# Patient Record
Sex: Female | Born: 1970 | Race: White | Hispanic: No | Marital: Single | Smoking: Former smoker
Health system: Southern US, Community
[De-identification: ages and names within clinical notes are randomized; demographics above are authoritative.]

## PROBLEM LIST (undated history)

## (undated) DIAGNOSIS — E039 Hypothyroidism, unspecified: Secondary | ICD-10-CM

## (undated) DIAGNOSIS — E079 Disorder of thyroid, unspecified: Secondary | ICD-10-CM

## (undated) DIAGNOSIS — F32A Depression, unspecified: Secondary | ICD-10-CM

## (undated) DIAGNOSIS — N189 Chronic kidney disease, unspecified: Secondary | ICD-10-CM

## (undated) DIAGNOSIS — R32 Unspecified urinary incontinence: Secondary | ICD-10-CM

## (undated) DIAGNOSIS — B2790 Infectious mononucleosis, unspecified without complication: Secondary | ICD-10-CM

## (undated) DIAGNOSIS — N329 Bladder disorder, unspecified: Secondary | ICD-10-CM

## (undated) DIAGNOSIS — G9332 Myalgic encephalomyelitis/chronic fatigue syndrome: Secondary | ICD-10-CM

## (undated) DIAGNOSIS — R87619 Unspecified abnormal cytological findings in specimens from cervix uteri: Secondary | ICD-10-CM

## (undated) DIAGNOSIS — Z87440 Personal history of urinary (tract) infections: Secondary | ICD-10-CM

## (undated) DIAGNOSIS — R5382 Chronic fatigue, unspecified: Secondary | ICD-10-CM

## (undated) DIAGNOSIS — F329 Major depressive disorder, single episode, unspecified: Secondary | ICD-10-CM

## (undated) DIAGNOSIS — D649 Anemia, unspecified: Secondary | ICD-10-CM

## (undated) DIAGNOSIS — Z8669 Personal history of other diseases of the nervous system and sense organs: Secondary | ICD-10-CM

## (undated) DIAGNOSIS — Z8739 Personal history of other diseases of the musculoskeletal system and connective tissue: Secondary | ICD-10-CM

## (undated) DIAGNOSIS — D8989 Other specified disorders involving the immune mechanism, not elsewhere classified: Secondary | ICD-10-CM

## (undated) DIAGNOSIS — F419 Anxiety disorder, unspecified: Secondary | ICD-10-CM

## (undated) DIAGNOSIS — IMO0002 Reserved for concepts with insufficient information to code with codable children: Secondary | ICD-10-CM

## (undated) HISTORY — DX: Anemia, unspecified: D64.9

## (undated) HISTORY — DX: Depression, unspecified: F32.A

## (undated) HISTORY — PX: ROBOTIC ASSISTED LAP VAGINAL HYSTERECTOMY: SHX2362

## (undated) HISTORY — PX: TOTAL VAGINAL HYSTERECTOMY: SHX2548

## (undated) HISTORY — DX: Other specified disorders involving the immune mechanism, not elsewhere classified: D89.89

## (undated) HISTORY — DX: Unspecified urinary incontinence: R32

## (undated) HISTORY — DX: Hypothyroidism, unspecified: E03.9

## (undated) HISTORY — DX: Bladder disorder, unspecified: N32.9

## (undated) HISTORY — DX: Infectious mononucleosis, unspecified without complication: B27.90

## (undated) HISTORY — DX: Anxiety disorder, unspecified: F41.9

## (undated) HISTORY — DX: Unspecified abnormal cytological findings in specimens from cervix uteri: R87.619

## (undated) HISTORY — DX: Myalgic encephalomyelitis/chronic fatigue syndrome: G93.32

## (undated) HISTORY — DX: Major depressive disorder, single episode, unspecified: F32.9

## (undated) HISTORY — DX: Chronic kidney disease, unspecified: N18.9

## (undated) HISTORY — DX: Reserved for concepts with insufficient information to code with codable children: IMO0002

## (undated) HISTORY — DX: Personal history of other diseases of the musculoskeletal system and connective tissue: Z87.39

## (undated) HISTORY — DX: Chronic fatigue, unspecified: R53.82

## (undated) HISTORY — DX: Personal history of other diseases of the nervous system and sense organs: Z86.69

## (undated) HISTORY — PX: SYNOVECTOMY WRIST: SUR1324

## (undated) HISTORY — DX: Personal history of urinary (tract) infections: Z87.440

## (undated) HISTORY — DX: Disorder of thyroid, unspecified: E07.9

---

## 1998-05-06 ENCOUNTER — Other Ambulatory Visit: Admission: RE | Admit: 1998-05-06 | Discharge: 1998-05-06 | Payer: Self-pay | Admitting: Obstetrics and Gynecology

## 1999-05-03 ENCOUNTER — Encounter (INDEPENDENT_AMBULATORY_CARE_PROVIDER_SITE_OTHER): Payer: Self-pay | Admitting: Specialist

## 1999-05-03 ENCOUNTER — Other Ambulatory Visit: Admission: RE | Admit: 1999-05-03 | Discharge: 1999-05-03 | Payer: Self-pay | Admitting: Orthopedic Surgery

## 1999-05-10 ENCOUNTER — Other Ambulatory Visit: Admission: RE | Admit: 1999-05-10 | Discharge: 1999-05-10 | Payer: Self-pay | Admitting: Obstetrics and Gynecology

## 2000-07-20 ENCOUNTER — Other Ambulatory Visit: Admission: RE | Admit: 2000-07-20 | Discharge: 2000-07-20 | Payer: Self-pay | Admitting: Obstetrics & Gynecology

## 2001-07-23 ENCOUNTER — Other Ambulatory Visit: Admission: RE | Admit: 2001-07-23 | Discharge: 2001-07-23 | Payer: Self-pay | Admitting: Obstetrics and Gynecology

## 2002-07-29 ENCOUNTER — Other Ambulatory Visit: Admission: RE | Admit: 2002-07-29 | Discharge: 2002-07-29 | Payer: Self-pay | Admitting: Obstetrics and Gynecology

## 2003-02-09 ENCOUNTER — Ambulatory Visit (HOSPITAL_COMMUNITY): Admission: RE | Admit: 2003-02-09 | Discharge: 2003-02-09 | Payer: Self-pay | Admitting: Obstetrics and Gynecology

## 2003-02-09 ENCOUNTER — Encounter (INDEPENDENT_AMBULATORY_CARE_PROVIDER_SITE_OTHER): Payer: Self-pay

## 2003-02-11 ENCOUNTER — Inpatient Hospital Stay (HOSPITAL_COMMUNITY): Admission: AD | Admit: 2003-02-11 | Discharge: 2003-02-11 | Payer: Self-pay | Admitting: Obstetrics and Gynecology

## 2003-08-06 ENCOUNTER — Other Ambulatory Visit: Admission: RE | Admit: 2003-08-06 | Discharge: 2003-08-06 | Payer: Self-pay | Admitting: Obstetrics and Gynecology

## 2006-04-12 ENCOUNTER — Ambulatory Visit: Payer: Self-pay | Admitting: Family Medicine

## 2006-09-20 ENCOUNTER — Other Ambulatory Visit: Admission: RE | Admit: 2006-09-20 | Discharge: 2006-09-20 | Payer: Self-pay | Admitting: Obstetrics and Gynecology

## 2006-10-03 ENCOUNTER — Ambulatory Visit: Payer: Self-pay | Admitting: Family Medicine

## 2006-10-10 ENCOUNTER — Ambulatory Visit: Payer: Self-pay | Admitting: Family Medicine

## 2006-10-19 ENCOUNTER — Ambulatory Visit: Payer: Self-pay | Admitting: Family Medicine

## 2006-10-23 ENCOUNTER — Ambulatory Visit: Payer: Self-pay | Admitting: Internal Medicine

## 2006-11-15 ENCOUNTER — Ambulatory Visit: Payer: Self-pay | Admitting: Family Medicine

## 2007-02-12 ENCOUNTER — Ambulatory Visit: Payer: Self-pay | Admitting: Family Medicine

## 2007-03-06 ENCOUNTER — Ambulatory Visit: Payer: Self-pay | Admitting: Family Medicine

## 2007-03-28 ENCOUNTER — Telehealth (INDEPENDENT_AMBULATORY_CARE_PROVIDER_SITE_OTHER): Payer: Self-pay | Admitting: Internal Medicine

## 2007-04-22 ENCOUNTER — Ambulatory Visit: Payer: Self-pay | Admitting: Pain Medicine

## 2007-04-22 ENCOUNTER — Encounter (INDEPENDENT_AMBULATORY_CARE_PROVIDER_SITE_OTHER): Payer: Self-pay | Admitting: Internal Medicine

## 2007-08-23 ENCOUNTER — Ambulatory Visit (HOSPITAL_COMMUNITY): Admission: RE | Admit: 2007-08-23 | Discharge: 2007-08-24 | Payer: Self-pay | Admitting: Obstetrics and Gynecology

## 2007-08-23 ENCOUNTER — Encounter (INDEPENDENT_AMBULATORY_CARE_PROVIDER_SITE_OTHER): Payer: Self-pay | Admitting: Obstetrics and Gynecology

## 2008-10-16 ENCOUNTER — Emergency Department (HOSPITAL_COMMUNITY): Admission: EM | Admit: 2008-10-16 | Discharge: 2008-10-16 | Payer: Self-pay | Admitting: *Deleted

## 2010-12-15 NOTE — Progress Notes (Signed)
Summary: LETTER   Phone Note Call from Patient Call back at Home Phone [phone]   Caller: Patient Call For: BEAN Summary of Call: PT NEEDS A LETTER STATING THAT YOU DID REFER HER TO PAMELA PITTMAN FOR PANIC/ANXUETY ATTACKS Initial call taken by: Liane Comber,  Mar 28, 2007 4:25 PM  Follow-up for Phone Call        need her chart  --------------------------- Letter done 04/01/07 Follow-up by: Gildardo Griffes FNP,  Mar 29, 2007 7:18 AM  Additional Follow-up for Phone Call Additional follow up Details #1::        chart is now in triage in box ..................................................................Marland KitchenLiane Comber  Mar 29, 2007 9:50 AM\par

## 2010-12-15 NOTE — Consult Note (Signed)
Summary: Consultation Report  Consultation Report   Imported By: Beau Fanny 05/03/2007 10:24:48  _____________________________________________________________________  External Attachment:    Type:   Image     Comment:   External Document

## 2011-03-28 NOTE — Op Note (Signed)
Ariel King, Ariel King              ACCOUNT NO.:  [id number]   MEDICAL RECORD NO.:  [id number]          PATIENT TYPE:  AMB   LOCATION:  DAY                          FACILITY:  [facility]   PHYSICIAN:  Crist Fat. Rivard, M.D. DATE OF BIRTH:  1971-10-26   DATE OF PROCEDURE:  08/23/2007  DATE OF DISCHARGE:                               OPERATIVE REPORT   PREOPERATIVE DIAGNOSIS:  Chronic pelvic pain with adenomyosis.   POSTOPERATIVE DIAGNOSIS:  Chronic pelvic pain with adenomyosis.   ANESTHESIA:  General.   PROCEDURE:  Robotic-assisted total hysterectomy.   SURGEON:  Crist Fat. Rivard, M.D.   ASSISTANT:  Henreitta Leber, P.A.   PROCEDURE:  After being informed of the planned procedure with possible  complications including bleeding, infection, injury to bowel, bladder or  ureters, need for laparotomy, informed consent is obtained.  The patient  is taken to OR #10, given general anesthesia with endotracheal  intubation and placed in a lithotomy position on a sticky mattress with  knee-high sequential compression devices.  Her arms are padded and  tucked on each side and her chest is taped to the table.  She is prepped  and draped in a sterile fashion and a Foley catheter is inserted in her  bladder.  Pelvic exam reveals an anteverted uterus which is slightly  bulky, but otherwise normal and 2 normal adnexa.  There was no  significant prolapse on the exam.  A weighted speculum is inserted.  Anterior lip of the cervix is grasped with a tenaculum forceps and the  Mirena IUD is removed.  The uterus is then sounded at 8 cm and the  cervix easily allows the entry of a #29 Hegar dilator.  Using a 8-mm  Rumi intrauterine manipulator, we insert it easily with a 3.5 co-ring  and a vaginal occluder.  The Rumi is inflated with 5 mL of saline and  the co-ring is sutured to the cervix with 3 simple sutures of 0 Vicryl.  The tenaculum forceps is changed and replaced for a Jacobs forceps.  Weighted speculum  is removed.   We infiltrate the umbilical area with Marcaine 0.25, perform a semi-  elliptical incision and we bluntly reach the fascia.  The fascia is  identified, grasped with Kocher forceps, incised and the peritoneum is  entered bluntly.  A pursestring suture of 0 Vicryl is placed on the  fascia and a 10-mm Hasson trocar is inserted and held in place by the  pursestring suture.  This allows insufflation of the pneumoperitoneum  with CO2 at a maximum pressure of 15 mmHg.  Camera is inserted to  evaluate the pelvis.  We see a bulky, soft uterus, mobile, normal  anterior and posterior cul-de-sac, normal bilateral tubes, normal right  ovary; the left ovary has a dominant follicle of approximately 1.5 cm.  Liver edge is visualized and normal.  No other significant finding is  noted.  The camera is removed.  We measured the placement of our trocars  and placed two 8-mm robotic trocars on the left side, one 8-mm robotic  trocar on the right side and one 10-mm  patient-side assistant trocar on  the right side, all under direct visualization after infiltrating with  Marcaine 0.25%.   The robot is then docked and console time starts at 8:43, which gives [place]  a total docking time including preparation of 40 minutes.   Using a monopolar scissors in arm #1, a PK Gyrus forceps in arm #2 and a  Cobra grasper in arm #3, we proceed systematically to perform a total  robotic hysterectomy.  We start with the right side and cauterized using  the PK forceps, cauterize the utero-ovarian ligament and section it,  cauterize the tube and section it, cauterize the round ligament and  section it.  The broad ligament can then be sharply dissected anteriorly  and posteriorly and the bladder can be sharply and bluntly dissected  away from the vaginal cuff.  This vaginal cuff is easily identified with  pressure applied on the co-ring.  The right ureter is away from our site  of cauterization and section and we can  now isolate the uterine vessels  in the ascending branch near the co-ring on the right side; these  vessels were then cauterized with PK forceps and sectioned.  We proceed  in the same fashion on the left side, cauterizing the utero-ovarian  ligament and sectioning it, cauterizing the tube and sectioning it,  cauterizing the round ligament and sectioning it.  The broad ligament  again is opened both in the anterior sheath and posterior sheath to  skeletonize the vessels and finished the dissection of the bladder away  from the vaginal fornix.  The vessels are then well skeletonized and  away from the site of the ureter, they are then grasped with PK forceps,  cauterized and sectioned.   The vaginal occluder is then inflated and we proceed with a circular  colpotomy, guiding ourselves with the co-ring in order to remove the  uterus entirely; this is performed using monopolar scissors, and  occasionally cauterization with PK forceps.  The uterus is released  entirely and delivered via the vagina.  We did encounter a small amount  of time where the pneumoperitoneum was minimum and so the uterus is left  inside the vagina to optimize the pneumoperitoneum and facilitate the  closure of the vaginal cuff.   Instruments are then modified for arm #1 needle holder, arm #2 needle  holder and arm #3 PK forceps.  Using 0 Vicryl, we proceed with closure  of the vaginal cuff using figure-of-eight stitches of 0 Vicryl.  Please  note that the cuff was opened superiorly to the uterosacral ligament,  preserving natural suspension.   We then irrigate profusely with warm saline and note a few oozing sites  on our dissection of the bladder flap, which is controlled with  cauterization.  Both ureters are visualized with good peristalsis and  hemostasis is deemed adequate.  A sheet of Intercede is deposited on the  vaginal cuff to prevent formation of future adhesions.   All instruments are removed, robot  is undocked, and trocars are removed  under direct visualization after evacuating the pneumoperitoneum.   The umbilical incision's fascia is closed with the previously placed  pursestring suture of 0 Vicryl.  The fascia at the site of the 10-mm  patient-side assistant trocar is closed with a figure-of-eight stitch of  0 Vicryl.  All incisions are closed with subcuticular suture of 3-0  Monocryl and Steri-Strips.   Instrument and sponge count is complete x2.  Estimated blood loss is  minimal.  The procedure is very well tolerated by the patient, who is  taken to recovery room in a well and stable condition.   SPECIMEN:  Uterus sent to Pathology.      Crist Fat Rivard, M.D.  Electronically Signed     SAR/MEDQ  D:  08/23/2007  T:  08/24/2007  Job:  [id number]

## 2011-03-28 NOTE — H&P (Signed)
Ariel King, Ariel King              ACCOUNT NO.:  [id number]   MEDICAL RECORD NO.:  [id number]          PATIENT TYPE:  AMB   LOCATION:  DAY                          [facility]ACILITY:  [facility]   PHYSICIAN:  Crist Fat. Rivard, M.D. DATE OF BIRTH:  31-Jul-1971   DATE OF ADMISSION:  DATE OF DISCHARGE:                              HISTORY & PHYSICAL   EXPECTED DATE OF ADMISSION:  August 23, 2007   HISTORY OF PRESENT ILLNESS:  Ariel King is a 40 year old married white  female para 2-0-0-2 who presents for  robotic assisted laparoscopic  hysterectomy because of dysfunctional uterine bleeding, adenomyosis and  pelvic pain.  For over 4 years, the patient has had irregular heavy  menstrual bleeding ranging from several days of spotting before her  period to 16 days of a flow which includes clots and requires her to  change a super tampon every 1-2 hours.  All this bleeding is typically  accompanied by severe pelvic and back cramping requiring narcotic  analgesia.  In 2004 the patient underwent a diagnostic laparoscopy to  evaluate for endometriosis.  Endometriosis was not found.  However, the  patient was diagnosed with adenomyosis.  She was placed on Danazol and  then birth control pills following her surgery which improved her  bleeding but not her pain.  She underwent physical therapy and  eventually or and orthopedic consultation for which she was given facet  injections to curtail her pain.  The patient received symptomatic relief  of both bleeding and pain.  However, that relief was short-lived.  She  then received a Mirena intrauterine system in May 2005 which was  preceded by a course of Aygestin  for her heavy bleeding.  Fortunately  the patient achieved amenorrhea with Mirena and a marked decrease in her  pelvic/back pain.  This relief lasted approximately 2 years and then her  pelvic/pain returned, occurred daily, and was accompanied on occasion  with light bleeding which lasted 10 days.  Once  again the patient was  requiring narcotic analgesia.  She denies any urinary tract symptoms,  nausea, vomiting, diarrhea, fever, vaginitis symptoms, however she does  admit to positional dyspareunia, constipation and a burning pain in her  lower back.  A thorough review of both medical and surgical options were  given to the patient for consideration, however given the protracted  course of her symptoms and multiple failed therapies, she has consented  to proceed with definitive therapy in the form of hysterectomy.   PAST MEDICAL HISTORY:  OB history gravida 2, para 2-0-0-2.  The patient  had two spontaneous vaginal births.  GYN history:  Menarche 40 years  old.  Her last menstrual period:  The patient has not had one due to the  fact she has an intrauterine device (Mirena).  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  Her last Pap smear was ASCUS in May 2008, however her HPV test was  negative.   MEDICAL HISTORY:  Peptic ulcer disease, pelvic pain with lower back  pain, anemia, anxiety disorder and  depression.   SURGICAL HISTORY:  1986, cervical conization, 2000 left wrist surgery,  2004 diagnostic laparoscopy.  The patient reports anesthesia causes her  severe vomiting.  She denies any history of blood transfusions.   FAMILY HISTORY:  Cardiovascular disease, prostate and lung cancer,  thyroid disease, diabetes mellitus, hypertension, thyroid disease   SOCIAL HISTORY:  The patient is married and she works as a Chartered loss adjuster.   HABITS:  She is a former smoker, does not use alcohol   MEDICATIONS:  Darvocet-N 50 1 tablet every 4-6 hours as needed for pain,  Lexapro 30 mg daily, Xanax 0.5 mg four times a day with the addition of  1/2 tablet four times a day as needed.   ALLERGIES:  She has no known drug allergies.   REVIEW OF SYSTEMS:  The patient does have occasional blood in her stool  (negative GI workup), back pain,  but denies any chest pain, shortness of  breath, headache, vision changes, unilateral weakness and except as  mentioned in history of present illness the patient's review of systems  is negative.   PHYSICAL EXAM:  Vital Signs: Blood pressure 118/70 pulses 80, weight is  187.  Height is 5 feet 4 inches tall.  NECK is supple without masses.  There is no cervical adenopathy or thyromegaly.  HEART: Regular rate and rhythm.  There is no murmur.  LUNGS were clear.  BACK: No CVA tenderness, though the patient does have tenderness in both  SI joints and over her sacrum.  ABDOMEN: Without tenderness, masses or organomegaly.  EXTREMITIES: No clubbing, cyanosis or edema.  PELVIC EXAM:  EG/BUS is within normal limits.  Vagina is rugous.  Cervix  is nontender without lesions.  The patient's IUD strings are visible.  Uterus appears normal size, shape and consistency, however it is tender.  Adnexa without masses or tenderness.   IMPRESSION:  1. Dysfunctional uterine bleeding.  2. Adenomyosis.  3. Pelvic pain.   DISPOSITION:  A discussion was held with the patient regarding the  indications for her procedure along with its risks which include but are  not limited to reaction to anesthesia, damage to adjacent organs,  infection, excessive bleeding and the possible need for an open  abdominal incision to complete her surgery safely.  The patient further  understands that she will experience transient facial edema  postoperatively, that her hospital stay is expected to be 1-2 days and  that within 2-3 weeks she should be able to return to her usual  activities.  The patient verbalized understanding of these risks and has  consented to proceed with robotic-assisted laparoscopic hysterectomy at  [facility] on August 23, 2007, 7:30 a.m.  The patient  was given a MiraLax bowel prep to be completed 24 hours prior to her  procedure.      Elmira J. Adline Peals.      Crist Fat  Rivard, M.D.  Electronically Signed    EJP/MEDQ  D:  08/20/2007  T:  08/20/2007  Job:  [id number]

## 2011-03-31 NOTE — Assessment & Plan Note (Signed)
[facility]                                 ON-CALL NOTE   NAME:Hansmann, ULONDA                       MRN:          [id number]  DATE:01/06/2007                            DOB:          05/06/1971    PHONE:  [phone].   PRIMARY CARE:  Everrett Coombe at [facility].   TIME:  4:54 p.m. on February 24.   She has back pain that started on Friday night when she was moving some  heavy furniture.  She has tried ibuprofen but it has not helped a whole  lot and she is having trouble sleeping.  She asked if I can phone in  some pain medicine for her to help her sleep.   PLAN:  I told her that we do not phone in pain medicines on weekends or  at nights and that if she really thought she needs some help she should  probably seek care tonight in an Urgent Care or emergency room.  Otherwise, we can see her tomorrow in the office.     Karie Schwalbe, MD  Electronically Signed    RIL/MedQ  DD: 01/06/2007  DT: 01/06/2007  Job #: [id number]   cc:   Willaim Sheng D. Bean, FNP

## 2011-03-31 NOTE — Op Note (Signed)
NAME:  Ariel King, Ariel King                        ACCOUNT NO.:  [id number]   MEDICAL RECORD NO.:  [id number]                   PATIENT TYPE:  AMB   LOCATION:  [facility]C                                  FACILITY:  [facility]   PHYSICIAN:  Crist Fat. Rivard, M.D.              DATE OF BIRTH:  04-07-1971   DATE OF PROCEDURE:  02/09/2003  DATE OF DISCHARGE:                                 OPERATIVE REPORT   PREOPERATIVE DIAGNOSES:  Pelvic pain with dyspareunia.   POSTOPERATIVE DIAGNOSES:  Pelvic pain with dyspareunia with possible  adenomyosis.   ANESTHESIA:  General, Burnett Corrente, M.D.   PROCEDURE:  Diagnostic laparoscopy with peritoneal biopsy.   SURGEON:  Crist Fat. Rivard, M.D.   ESTIMATED BLOOD LOSS:  Minimal.   PROCEDURE:  After being informed of the planned procedure with possible  complications including bleeding, infection, injury to other organs  necessitating laparotomy, informed consent was obtained.  The patient was  taken to OR number eight, given general anesthesia with endotracheal  intubation, and placed in lithotomy position.  She was prepped and draped in  a sterile fashion.  A Foley catheter was inserted in her bladder and an  acorn manipulator was placed in her uterus with a tenaculum on her anterior  lip of her cervix.   We proceeded with infiltration of the umbilical area using 10 mL of Marcaine  0.25 and performed a semi-elliptical incision for insertion of Veress needle  and insufflation of pneumoperitoneum with CO2 at a maximum pressure of 15  mmHg.  Veress needle was removed and a 10 mm trocar was inserted with the  laparoscope on video.  Suprapubic area was infiltrated with 4 mL of Marcaine  0.25.  We proceeded with insertion under direct visualization of a 5 mm  trocar.   Observation:  Anterior cul-de-sac is normal other than diffuse petechiae in  the anterior cul-de-sac close to the right round ligament.  Uterus is  slightly boggy and consistency is compatible  with adenomyosis.  Posterior  cul-de-sac is normal.  Both tubes and both ovaries are normal.  There is no  apparent pelvic inflammatory disease and there is no adhesions.  Appendix  was well visualized and normal.  Liver edge was visualized and normal.  We  proceeded with a peritoneal biopsy of the right anterior cul-de-sac using  the because punch.  This was sent for pathology.  There was no active  bleeding.  Pneumoperitoneum was removed.  Instruments were removed.  Instrument and sponge count was complete x2.  Both incisions were closed  with subcuticular suture of 4-0 Vicryl.   Procedure was very well tolerated by patient who is taken to recovery room  in a well and stable condition.  Crist Fat Rivard, M.D.    SAR/MEDQ  D:  02/09/2003  T:  02/09/2003  Job:  [id number]

## 2011-03-31 NOTE — Consult Note (Signed)
NAME:  Ariel King, Ariel King                        ACCOUNT NO.:  [id number]   MEDICAL RECORD NO.:  [id number]                   PATIENT TYPE:  MAT   LOCATION:  [facility]C                                 FACILITY:  [facility]   PHYSICIAN:  Janine Limbo, M.D.            DATE OF BIRTH:  12-28-1970   DATE OF CONSULTATION:  02/11/2003  DATE OF DISCHARGE:                                   CONSULTATION   HISTORY OF PRESENT ILLNESS:  Ms. Droessler is a 40 year old female, P2-0-0-2,  who had a diagnostic laparoscopy performed on 02/09/03 by Dr. Dois Davenport Rivard.  The diagnosis for the procedure was pelvic pain with dyspareunia.  The  operative findings suggested adenomyosis.  A biopsy was obtained to rule out  endometriosis.  The patient's procedure went well.  There were no  complications.  The patient was thought to have tolerated her procedure  well.  The patient then reported that today she started noticing shortness  of breath and pain in her upper back and in her shoulders.  She complained  of swelling from her knees down to her feet.  She felt that she had fluid  that she could not cough up.  She reported that she started her cycle.  The  patient's last bowel movement was on 02/08/03.  She denies any problems  passing her urine.  She tolerated her diet well on 02/10/03.  She reports  that she has not eaten today.   DRUG ALLERGIES:  None known.   PAST MEDICAL HISTORY:  The patient currently takes oral contraceptives.  She  had an abnormal Pap smear in the past.  She denies hypertension and  diabetes.   PHYSICAL EXAMINATION:  VITAL SIGNS:  Temperature is 97.5, pulse 75,  respirations 18, blood pressure 143/80.  HEENT:  Within normal limits.  The patient does not appear to be in any  acute distress.  CHEST:  Clear.  HEART:  Regular rate and rhythm.  ABDOMEN:  Soft and appropriately tender.  Her incisions are healing well.  EXTREMITIES:  Obese, and there is 1+ to 2+ edema in her lower extremities.  There is trace edema in her upper extremities.  There  are no cords or  masses in her lower legs.  There is no tenderness in her lower legs.  NEUROLOGIC:  Grossly normal.  PELVIC:  External genitalia is normal.  The vagina is normal.  Cervix is  nontender.  Uterus is normal size, shape, and consistency (as best could be  told by an exam that is limited by obesity).  Adnexa with no masses.   LABORATORY VALUES:  White blood cell count of 7200, hemoglobin 12.3,  hematocrit 36.2%, platelet count 235,000.  Urinalysis is within normal  limits except for a large hemoglobin.   ASSESSMENT:  1. Two days status post diagnostic laparoscopy.  2. Shortness of breath of uncertain etiology.  The patient reports that she  now actually feels better.  Her O2 sat in the emergency department was     99% on room air.  3. Swelling in the lower extremities of uncertain etiology.    PLAN:  The patient will follow up with her family physician about her  swelling.  She will call if her shortness of breath should return or if she  should have any other major difficulties.  She will be discharged to home.  She will follow up with Dr. Estanislado Pandy as previously scheduled.                                               Janine Limbo, M.D.    AVS/MEDQ  D:  02/11/2003  T:  02/11/2003  Job:  [id number]   cc:   Dois Davenport A. Rivard, M.D.  [address]  [place]  [place] [address]  Fax: [phone]

## 2011-03-31 NOTE — Letter (Signed)
Apr 01, 2007     RE:  BRAELIN, BROSCH  MRN:  [id number]  /  DOB:  1971-05-11   To Whom It May Concern:   Ariel King is a patient in the [facility] and was referred to Dr. Jamas Lav, psychiatrist, and was first  seen February 25, 2007.   If I can be of further assistance, please do not hesitate to call.    Sincerely,       Billie D. Jillyn Hidden, FNP       Arta Silence, MD    BDB/MedQ  DD: 04/01/2007  DT: 04/01/2007  Job #: [id number]

## 2011-08-24 LAB — CBC
HCT: 29.7 — ABNORMAL LOW
HCT: 37.1
Hemoglobin: 10.5 — ABNORMAL LOW
Hemoglobin: 12.9
MCHC: 34.8
MCHC: 35.4
MCV: 87.3
MCV: 88
Platelets: 277
Platelets: 305
RBC: 3.4 — ABNORMAL LOW
RBC: 4.21
RDW: 13.5
RDW: 13.6
WBC: 13.6 — ABNORMAL HIGH
WBC: 9.1

## 2011-08-24 LAB — BASIC METABOLIC PANEL
BUN: 9
CO2: 29
Calcium: 9.8
Chloride: 97
Creatinine, Ser: 0.6
GFR calc Af Amer: >60
GFR calc non Af Amer: >60
Glucose, Bld: 95
Potassium: 4.1
Sodium: 136

## 2011-08-24 LAB — PREGNANCY, URINE: Preg Test, Ur: NEGATIVE

## 2011-08-30 ENCOUNTER — Ambulatory Visit (INDEPENDENT_AMBULATORY_CARE_PROVIDER_SITE_OTHER): Payer: BC Managed Care – PPO | Admitting: Psychology

## 2011-08-30 DIAGNOSIS — F410 Panic disorder [episodic paroxysmal anxiety] without agoraphobia: Secondary | ICD-10-CM

## 2011-08-30 DIAGNOSIS — F331 Major depressive disorder, recurrent, moderate: Secondary | ICD-10-CM

## 2011-09-13 ENCOUNTER — Ambulatory Visit: Payer: BC Managed Care – PPO | Admitting: Psychology

## 2012-03-13 ENCOUNTER — Ambulatory Visit: Payer: Self-pay | Admitting: Obstetrics and Gynecology

## 2012-03-14 ENCOUNTER — Ambulatory Visit (INDEPENDENT_AMBULATORY_CARE_PROVIDER_SITE_OTHER): Payer: BC Managed Care – PPO | Admitting: Obstetrics and Gynecology

## 2012-03-14 ENCOUNTER — Encounter: Payer: Self-pay | Admitting: Obstetrics and Gynecology

## 2012-03-14 VITALS — BP 118/72 | Temp 98.4°F | Ht 63.5 in | Wt 195.0 lb

## 2012-03-14 DIAGNOSIS — E079 Disorder of thyroid, unspecified: Secondary | ICD-10-CM

## 2012-03-14 DIAGNOSIS — F411 Generalized anxiety disorder: Secondary | ICD-10-CM

## 2012-03-14 DIAGNOSIS — F419 Anxiety disorder, unspecified: Secondary | ICD-10-CM

## 2012-03-14 DIAGNOSIS — Z113 Encounter for screening for infections with a predominantly sexual mode of transmission: Secondary | ICD-10-CM

## 2012-03-14 DIAGNOSIS — F332 Major depressive disorder, recurrent severe without psychotic features: Secondary | ICD-10-CM | POA: Insufficient documentation

## 2012-03-14 DIAGNOSIS — N399 Disorder of urinary system, unspecified: Secondary | ICD-10-CM

## 2012-03-14 DIAGNOSIS — R6882 Decreased libido: Secondary | ICD-10-CM

## 2012-03-14 DIAGNOSIS — IMO0002 Reserved for concepts with insufficient information to code with codable children: Secondary | ICD-10-CM

## 2012-03-14 DIAGNOSIS — G43909 Migraine, unspecified, not intractable, without status migrainosus: Secondary | ICD-10-CM

## 2012-03-14 DIAGNOSIS — F32A Depression, unspecified: Secondary | ICD-10-CM

## 2012-03-14 DIAGNOSIS — N949 Unspecified condition associated with female genital organs and menstrual cycle: Secondary | ICD-10-CM

## 2012-03-14 DIAGNOSIS — R102 Pelvic and perineal pain: Secondary | ICD-10-CM

## 2012-03-14 DIAGNOSIS — R5382 Chronic fatigue, unspecified: Secondary | ICD-10-CM

## 2012-03-14 DIAGNOSIS — G43009 Migraine without aura, not intractable, without status migrainosus: Secondary | ICD-10-CM | POA: Insufficient documentation

## 2012-03-14 DIAGNOSIS — Z124 Encounter for screening for malignant neoplasm of cervix: Secondary | ICD-10-CM

## 2012-03-14 DIAGNOSIS — F329 Major depressive disorder, single episode, unspecified: Secondary | ICD-10-CM

## 2012-03-14 DIAGNOSIS — Z01419 Encounter for gynecological examination (general) (routine) without abnormal findings: Secondary | ICD-10-CM

## 2012-03-14 LAB — POCT URINALYSIS DIPSTICK
Bilirubin, UA: NEGATIVE
Glucose, UA: NEGATIVE
Ketones, UA: NEGATIVE
Leukocytes, UA: NEGATIVE
Nitrite, UA: NEGATIVE
Spec Grav, UA: >=1.03
Urobilinogen, UA: NEGATIVE
pH, UA: 5

## 2012-03-14 NOTE — Progress Notes (Signed)
Regular Periods: no Mammogram: no  Monthly Breast Ex.: yes Exercise: no  Tetanus < 10 years: yes Seatbelts: yes  NI. Bladder Functn.: no Abuse at home: no  Daily BM's: yes Stressful Work: no  Healthy Diet: yes Sigmoid-Colonoscopy: NO  Calcium: yes Medical problems this year: DECREASED LIBIDO   LAST PAP:2 YEARS AGO WANT PAP Contraception: HYST  Mammogram: NEVER  PCP: Elie Goody  PMH:NO CHANGE FMH: NO CHANGE NO BONE SCAN

## 2012-03-14 NOTE — Progress Notes (Signed)
  Subjective:    Ariel King is a 41 y.o. female, G2P2002, who presents for an annual exam. She is s/p hysterectomy (ovaries spared) requesting PAP smear and STD testing due to marital infidelity . Past several months has not had a sexual  desire and can't be put in the mood. Additionally c/o burning and  hesitancy with urination.    Menstrual cycle:   LMP: No LMP recorded. Patient has had a hysterectomy.           Cycle: NA-hysterectomy  The following portions of the patient's history were reviewed and updated as appropriate: allergies, current medications, past family history, past medical history, past social history, past surgical history and problem list.  Review of Systems Pertinent items are noted in HPI. Breast:Negative for breast lump,nipple discharge or nipple retraction Gastrointestinal: Negative for abdominal pain, change in bowel habits or rectal bleeding Urinary:negative   Objective:    BP 118/72  Temp(Src) 98.4 F (36.9 C) (Oral)  Ht 5' 3.5" (1.613 m)  Wt 195 lb (88.451 kg)  BMI 34.00 kg/m2    Weight:  Wt Readings from Last 1 Encounters:  03/14/12 195 lb (88.451 kg)          BMI: Body mass index is 34.00 kg/(m^2).  General Appearance: Alert, tearful during visit as she speaks of her marital conflicts  HEENT: Grossly normal Neck / Thyroid: Supple, no masses, nodes or enlargement Lungs: clear to auscultation bilaterally Back: No CVA tenderness Breast Exam: No masses or nodes.No dimpling, nipple retraction or discharge. Cardiovascular: Regular rate and rhythm. S1, S2, no murmur Gastrointestinal: Soft, non-tender, no masses or organomegaly Pelvic Exam: External genitalia: normal general appearance Rectal: good sphincter tone and no masses uterus and cervix surgically absent Lymphatic Exam: Non-palpable nodes in neck, clavicular, axillary, or inguinal regions Skin: no rash or abnormalities Neurologic: Normal gait and speech, no tremor  Psychiatric: Alert and  oriented, flat affect, tearful (denies suicidal or homicidal ideations)  Urinalysis:negative   sent for culture   Assessment:    Routine GYN Exam   Decreased Libido  Marital Infidelity Plan:  STD testing-pending  Reviewed causes of deceased libido in women  Recommended counseling for marital infidelity  PAP smear-patient request (reviewed revised PAP guidelines for hysterectomy)   Mammogram advised  F/U with PCP to explore possible interstitial cystitis  Macklen Wilhoite, PA-C

## 2012-03-15 LAB — HSV 1 ANTIBODY, IGG: HSV 1 Glycoprotein G Ab, IgG: 7.32 IV — ABNORMAL HIGH

## 2012-03-15 LAB — HSV 2 ANTIBODY, IGG: HSV 2 Glycoprotein G Ab, IgG: 11.95 IV — ABNORMAL HIGH

## 2012-03-15 LAB — HEPATITIS B SURFACE ANTIGEN: Hepatitis B Surface Ag: NEGATIVE

## 2012-03-15 LAB — GC/CHLAMYDIA PROBE AMP, URINE
Chlamydia, Swab/Urine, PCR: NEGATIVE
GC Probe Amp, Urine: NEGATIVE

## 2012-03-15 LAB — RPR

## 2012-03-15 LAB — HIV ANTIBODY (ROUTINE TESTING W REFLEX): HIV: NONREACTIVE

## 2012-03-15 LAB — HEPATITIS C ANTIBODY: HCV Ab: NEGATIVE

## 2012-03-16 LAB — URINE CULTURE

## 2012-03-19 LAB — PAP IG W/ RFLX HPV ASCU

## 2012-03-29 ENCOUNTER — Telehealth: Payer: Self-pay | Admitting: Obstetrics and Gynecology

## 2012-03-29 NOTE — Telephone Encounter (Signed)
Laura/epic °

## 2012-03-29 NOTE — Telephone Encounter (Signed)
Pt called for test results from 03-14-12. 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  Information on HSV given to patient and told to call asap if suspects breakout. Pt agreeable.  ld

## 2012-04-01 NOTE — Telephone Encounter (Signed)
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  Spoke with pt at length and will leave pamphlet at front desk for her to p/u.  ld

## 2012-05-10 ENCOUNTER — Ambulatory Visit: Payer: Self-pay | Admitting: Obstetrics and Gynecology

## 2012-09-11 ENCOUNTER — Telehealth: Payer: Self-pay | Admitting: Family Medicine

## 2012-09-11 NOTE — Telephone Encounter (Signed)
Caller: Kollyns/Patient; Patient Name: Ariel King; PCP: Eustaquio Boyden [facility]); Best Callback Phone Number: [phone].  Pt calling today 09/11/12 regarding having fatigue.  Said she was a pt at [facility] in the past, however appt desk said she is not current pt of practice and set her up as new pt with Dr. Sharen Hones for December.  Pt said she saw someone named Billy Bean and has also seen Dr. Dayton Martes in the past.  Pt wanting triage for increased fatigue.  Triager advised pt that we are not allowed to offer triage services for new pt of practice.  Triager contacted office and spoke with Rose and she talked with office manager and they advised to tell pt that they will be calling her back at above call back number shortly.  Information given to pt.  No triage assessment done.

## 2012-09-19 ENCOUNTER — Ambulatory Visit: Payer: Self-pay | Admitting: Nurse Practitioner

## 2012-10-31 ENCOUNTER — Ambulatory Visit: Payer: BC Managed Care – PPO | Admitting: Family Medicine

## 2012-12-25 DIAGNOSIS — G4709 Other insomnia: Secondary | ICD-10-CM | POA: Insufficient documentation

## 2012-12-25 DIAGNOSIS — E781 Pure hyperglyceridemia: Secondary | ICD-10-CM | POA: Insufficient documentation

## 2012-12-25 DIAGNOSIS — G894 Chronic pain syndrome: Secondary | ICD-10-CM | POA: Insufficient documentation

## 2012-12-25 DIAGNOSIS — R5382 Chronic fatigue, unspecified: Secondary | ICD-10-CM | POA: Insufficient documentation

## 2012-12-25 DIAGNOSIS — G43009 Migraine without aura, not intractable, without status migrainosus: Secondary | ICD-10-CM | POA: Insufficient documentation

## 2012-12-25 DIAGNOSIS — R739 Hyperglycemia, unspecified: Secondary | ICD-10-CM | POA: Insufficient documentation

## 2013-03-10 ENCOUNTER — Ambulatory Visit (INDEPENDENT_AMBULATORY_CARE_PROVIDER_SITE_OTHER): Payer: BC Managed Care – PPO | Admitting: Family Medicine

## 2013-03-10 ENCOUNTER — Encounter: Payer: Self-pay | Admitting: Family Medicine

## 2013-03-10 VITALS — BP 104/72 | HR 73 | Temp 98.3°F | Ht 63.5 in | Wt 186.2 lb

## 2013-03-10 DIAGNOSIS — H6121 Impacted cerumen, right ear: Secondary | ICD-10-CM | POA: Insufficient documentation

## 2013-03-10 DIAGNOSIS — H6120 Impacted cerumen, unspecified ear: Secondary | ICD-10-CM

## 2013-03-10 NOTE — Progress Notes (Signed)
Subjective:    Patient ID: Ariel King, female    DOB: 02-23-1971, 42 y.o.   MRN: [id number]  HPI Here with "blocked up ears" Cannot hear as well as usual R ear is better  Ears drain a bit   Has had some allergy symptoms - dry eyes and nose  No ear popping She does sometimes use a vinigar solution to clear ears   Patient Active Problem List   Diagnosis Date Noted  . Thyroid disease 03/14/2012  . Anxiety 03/14/2012  . Migraines 03/14/2012  . Depression 03/14/2012  . Chronic fatigue disorder 03/14/2012  . Decreased libido 03/14/2012  . Urinary tract disorder 03/14/2012   Past Medical History  Diagnosis Date  . Thyroid disease   . Chronic kidney disease   . Incontinence   . Hx of migraines   . Anemia   . H/O bladder infections   . Anxiety   . H/O joint problems   . Abnormal Pap smear   . Chronic fatigue and immune dysfunction syndrome   . Depression   . Chronic fatigue and immune dysfunction syndrome   . EBV infection   . Depression   . Bladder disorder    Past Surgical History  Procedure Laterality Date  . Robotic assisted lap vaginal hysterectomy     History  Substance Use Topics  . Smoking status: Former Smoker    Types: Cigarettes  . Smokeless tobacco: Never Used     Comment: quit 10 years ago  . Alcohol Use: No   Family History  Problem Relation Age of Onset  . Diabetes Mother   . Migraines Mother    Allergies  Allergen Reactions  . Naltrexone Other (See Comments)    Panic attack   Current Outpatient Prescriptions on File Prior to Visit  Medication Sig Dispense Refill  . ALPRAZolam (XANAX) 0.5 MG tablet Take 0.5 mg by mouth at bedtime as needed.      . thyroid (ARMOUR) 120 MG tablet Take 90 mg by mouth daily.       . Vitamin D, Ergocalciferol, (DRISDOL) 50000 UNITS CAPS Take 50,000 Units by mouth 2 (two) times a week.      . progesterone (PROMETRIUM) 100 MG capsule Take 100 mg by mouth daily.       No current facility-administered  medications on file prior to visit.      Review of Systems Review of Systems  Constitutional: Negative for fever, appetite change, fatigue and unexpected weight change.  Eyes: Negative for pain and visual disturbance ENT neg for ear pain / sinus pain or ST.  Respiratory: Negative for cough and shortness of breath.   Cardiovascular: Negative for cp or palpitations    Gastrointestinal: Negative for nausea, diarrhea and constipation.  Genitourinary: Negative for urgency and frequency.  Skin: Negative for pallor or rash   Neurological: Negative for weakness, light-headedness, numbness and headaches.  Hematological: Negative for adenopathy. Does not bruise/bleed easily.  Psychiatric/Behavioral: Negative for dysphoric mood. The patient is not nervous/anxious.         Objective:   Physical Exam  Constitutional: She appears well-developed and well-nourished. No distress.  obese and well appearing    HENT:  Head: Normocephalic and atraumatic.  Left Ear: External ear normal.  Nose: Nose normal.  Mouth/Throat: Oropharynx is clear and moist.  Cerumen impaction R ear canal Totally cleared with simple irrigation TM nl appearing and hearing significantly improved   Eyes: Conjunctivae and EOM are normal. Pupils are equal, round, and reactive  to light. Right eye exhibits no discharge. Left eye exhibits no discharge.  Neck: Normal range of motion. Neck supple.  Cardiovascular: Normal rate and regular rhythm.   Lymphadenopathy:    She has no cervical adenopathy.  Skin: Skin is warm and dry. No rash noted.  Psychiatric: She has a normal mood and affect.          Assessment & Plan:

## 2013-03-10 NOTE — Patient Instructions (Addendum)
Ear looks good after irrigation  If pain or other symptoms let us know  If you are prone to ear wax- can try debrox solution over the counter perhaps once per month as directed to help  Do not use q tips

## 2013-03-10 NOTE — Assessment & Plan Note (Signed)
Totally cleared with simple ear irriation and much relief of symptoms Disc strategies for future re: use of debrox, and warned not to use q tips

## 2013-03-20 ENCOUNTER — Ambulatory Visit: Payer: BC Managed Care – PPO | Admitting: Family Medicine

## 2013-03-20 DIAGNOSIS — Z0289 Encounter for other administrative examinations: Secondary | ICD-10-CM

## 2013-09-23 ENCOUNTER — Encounter: Payer: Self-pay | Admitting: Family Medicine

## 2013-09-25 ENCOUNTER — Ambulatory Visit: Payer: BC Managed Care – PPO | Admitting: Family Medicine

## 2013-09-25 DIAGNOSIS — Z0289 Encounter for other administrative examinations: Secondary | ICD-10-CM

## 2013-10-02 ENCOUNTER — Ambulatory Visit: Payer: BC Managed Care – PPO | Admitting: Family Medicine

## 2013-10-03 ENCOUNTER — Telehealth: Payer: Self-pay | Admitting: Family Medicine

## 2013-10-03 NOTE — Telephone Encounter (Signed)
Noted. Thanks.

## 2013-10-03 NOTE — Telephone Encounter (Signed)
Called patient to be sure she knows she has an appointment on Monday 10/06/13 at 8:15am with Dr. Sharen Hones.  She confirmed that she does know she has an appointment.  I let her know that if she does not show up for that appointment, then she will be terminated from our practice because she has been a "no show" for her past 3 appointments.  Pt states she has had a lot going on but she understands that she needs to show up on Monday 11/24 for her 8:15am appt.

## 2013-10-06 ENCOUNTER — Ambulatory Visit (INDEPENDENT_AMBULATORY_CARE_PROVIDER_SITE_OTHER): Payer: BC Managed Care – PPO | Admitting: Family Medicine

## 2013-10-06 ENCOUNTER — Ambulatory Visit (INDEPENDENT_AMBULATORY_CARE_PROVIDER_SITE_OTHER)
Admission: RE | Admit: 2013-10-06 | Discharge: 2013-10-06 | Disposition: A | Discharge status: Home / Self Care | Payer: BC Managed Care – PPO | Source: Ambulatory Visit | Attending: Family Medicine | Admitting: Family Medicine

## 2013-10-06 ENCOUNTER — Encounter: Payer: Self-pay | Admitting: Family Medicine

## 2013-10-06 VITALS — BP 110/76 | HR 80 | Temp 98.4°F | Wt 198.2 lb

## 2013-10-06 DIAGNOSIS — S8990XA Unspecified injury of unspecified lower leg, initial encounter: Secondary | ICD-10-CM

## 2013-10-06 DIAGNOSIS — S99912A Unspecified injury of left ankle, initial encounter: Secondary | ICD-10-CM

## 2013-10-06 NOTE — Patient Instructions (Signed)
I think you have an ankle sprain - treat with ASO brace whenever on your feet Use OTC ibuprofen 400 mg with food for discomfort as needed Continue elevation of leg and ice to ankle. Let us know if not better with above.

## 2013-10-06 NOTE — Progress Notes (Signed)
Pre-visit discussion using our clinic review tool. No additional management support is needed unless otherwise documented below in the visit note.  

## 2013-10-06 NOTE — Progress Notes (Signed)
  Subjective:    Patient ID: Ariel King, female    DOB: [birth year], 42 y.o.   MRN: [id number]  HPI CC: L ankle swelling  Mrs Ariel King is a new patient to me but established of practice who comes in today with acute concern of swollen left ankle.    >2 wks ago while sitting on commode, foot got numb, when she stood up she fell and hit her left ankle, left side, and side of head.  Able to stand up and bear weight immediately.  So far has tried babying foot and elevating leg, icing ankle.  Has been taking ibuprofen. Area staying swollen and sore. No h/o prior surgery or injury to L ankle.   Past Medical History  Diagnosis Date  . Thyroid disease   . Chronic kidney disease   . Incontinence   . Hx of migraines   . Anemia   . H/O bladder infections   . Anxiety   . H/O joint problems   . Abnormal Pap smear   . Chronic fatigue and immune dysfunction syndrome   . Depression   . EBV infection   . Depression   . Bladder disorder      Review of Systems Per HPI    Objective:   Physical Exam  Nursing note and vitals reviewed. Constitutional: She appears well-developed and well-nourished. No distress.  Musculoskeletal: She exhibits edema.  Tender and swelling noted left lateral ankle below lateral malleolus.  No bruising. Point tender to percussion posterior lateral malleolus. No pain at medial malleolus.  No significant pain with squeeze test.  No pain at base of 5th MT or at navicular. No ligament laxity noted. FROM.  2+ DP bilaterally Sensation intact.      Assessment & Plan:

## 2013-10-06 NOTE — Assessment & Plan Note (Signed)
Anticipate lateral ankle sprain of left.  Xray to r/o malleolar fracture. Treat with aso brace, stretching exercises discussed and provided, and continued elevation, ice, and start NSAID for discomfort. Update if sxs worsen or fail to improve.

## 2013-10-13 ENCOUNTER — Encounter: Payer: Self-pay | Admitting: Obstetrics and Gynecology

## 2013-10-13 ENCOUNTER — Ambulatory Visit: Payer: BC Managed Care – PPO | Admitting: Family Medicine

## 2013-10-13 DIAGNOSIS — Z0289 Encounter for other administrative examinations: Secondary | ICD-10-CM

## 2013-10-15 ENCOUNTER — Telehealth: Payer: Self-pay | Admitting: Family Medicine

## 2013-10-15 NOTE — Telephone Encounter (Addendum)
Patient dismissed from [facility] by Eustaquio Boyden MD , effective October 13, 2013. Dismissal letter sent out by certified / registered mail. DAJ  Received signed domestic return receipt verifying delivery of certified letter on October 22, 2013. Article number [id number] DAJ

## 2013-11-03 ENCOUNTER — Ambulatory Visit (INDEPENDENT_AMBULATORY_CARE_PROVIDER_SITE_OTHER): Payer: BC Managed Care – PPO | Admitting: Family Medicine

## 2013-11-03 ENCOUNTER — Ambulatory Visit (INDEPENDENT_AMBULATORY_CARE_PROVIDER_SITE_OTHER)
Admission: RE | Admit: 2013-11-03 | Discharge: 2013-11-03 | Disposition: A | Discharge status: Home / Self Care | Payer: BC Managed Care – PPO | Source: Ambulatory Visit | Attending: Family Medicine | Admitting: Family Medicine

## 2013-11-03 ENCOUNTER — Encounter: Payer: Self-pay | Admitting: Family Medicine

## 2013-11-03 VITALS — BP 98/64 | HR 90 | Temp 98.5°F | Ht 63.5 in | Wt 200.5 lb

## 2013-11-03 DIAGNOSIS — M25579 Pain in unspecified ankle and joints of unspecified foot: Secondary | ICD-10-CM

## 2013-11-03 DIAGNOSIS — M25572 Pain in left ankle and joints of left foot: Secondary | ICD-10-CM

## 2013-11-03 DIAGNOSIS — E669 Obesity, unspecified: Secondary | ICD-10-CM | POA: Insufficient documentation

## 2013-11-03 DIAGNOSIS — F5231 Female orgasmic disorder: Secondary | ICD-10-CM

## 2013-11-03 NOTE — Progress Notes (Signed)
Date:  11/03/2013   Name:  Ariel King   DOB:  1971/08/01   MRN:  [id number] Gender: female Age: 42 y.o.  Primary Physician:  Esmeralda Arthur, MD   Chief Complaint: Ankle Injury   Subjective:   History of Present Illness:  Ariel King is a 42 y.o. pleasant patient who presents with the following:  I was asked to see this patient as a one-time office consult for evaluation of left-sided ankle pain , as this patient has been discharged from [facility] primary care, effective 10/13/2013. Dismissal from my partner delivered to patient.  She reports having had a trauma where she slipped and fell in the bathroom and struck her left lateral ankle. She initially saw my partner on 10/06/2013. At that time, plain films of the left ankle were negative. He placed her in ASO ankle brace which she has been using intermittently.  She is still having some pain and swelling laterally.  After I completed my evaluation and recommendation, the patient also discussed and asked for my recommendations regarding anorgasmia over the last 4 or 5 months. She recently increased her dose of Effexor. No other changes.  Patient Active Problem List   Diagnosis Date Noted  . Obesity (BMI 30-39.9) 11/03/2013  . Left ankle injury 10/06/2013  . Thyroid disease 03/14/2012  . Anxiety 03/14/2012  . Migraines 03/14/2012  . Depression 03/14/2012  . Chronic fatigue disorder 03/14/2012  . Decreased libido 03/14/2012  . Urinary tract disorder 03/14/2012    Past Medical History  Diagnosis Date  . Thyroid disease   . Chronic kidney disease   . Incontinence   . Hx of migraines   . Anemia   . H/O bladder infections   . Anxiety   . H/O joint problems   . Abnormal Pap smear   . Chronic fatigue and immune dysfunction syndrome   . Depression   . EBV infection   . Depression   . Bladder disorder     Past Surgical History  Procedure Laterality Date  . Robotic assisted lap vaginal hysterectomy       History   Social History  . Marital Status: Married    Spouse Name: N/A    Number of Children: N/A  . Years of Education: N/A   Occupational History  . Not on file.   Social History Main Topics  . Smoking status: Former Smoker    Types: Cigarettes  . Smokeless tobacco: Never Used     Comment: quit 10 years ago  . Alcohol Use: No  . Drug Use: No  . Sexual Activity: Yes    Partners: Male    Birth Control/ Protection: Other-see comments     Comment: hyst   Other Topics Concern  . Not on file   Social History Narrative  . No narrative on file    Family History  Problem Relation Age of Onset  . Diabetes Mother   . Migraines Mother     Allergies  Allergen Reactions  . Naltrexone Other (See Comments)    Panic attack    Medication list has been reviewed and updated.  Review of Systems:  GEN: No fevers, chills. Nontoxic. Primarily MSK c/o today. MSK: Detailed in the HPI GI: tolerating PO intake without difficulty Neuro: No numbness, parasthesias, or tingling associated. Otherwise the pertinent positives of the ROS are noted above.   Objective:   Physical Examination: BP 98/64  Pulse 90  Temp(Src) 98.5 F (36.9 C) (Oral)  Ht 5'  3.5" (1.613 m)  Wt 200 lb 8 oz (90.946 kg)  BMI 34.96 kg/m2  Ideal Body Weight: Weight in (lb) to have BMI = 25: 143.1   GEN: Well-developed,well-nourished,in no acute distress; alert,appropriate and cooperative throughout examination HEENT: Normocephalic and atraumatic without obvious abnormalities. Ears, externally no deformities PULM: Breathing comfortably in no respiratory distress EXT: No clubbing, cyanosis, or edema PSYCH: Normally interactive. Cooperative during the interview. Pleasant. Friendly and conversant. Not anxious or depressed appearing. Normal, full affect.  ANKLE: L Echymosis: no Edema: no ROM: Full dorsi and plantar flexion, inversion, eversion Gait: heel toe, non-antalgic - minimal Lateral Mall:  mild-mod ttp Medial Mall: NT Talus: NT Navicular: NT Cuboid: NT Calcaneous: NT Metatarsals: NT 5th MT: NT Phalanges: NT Achilles: NT Plantar Fascia: NT Fat Pad: NT Peroneals: NT Post Tib: NT Great Toe: Nml motion Ant Drawer: neg Talar Tilt: neg ATFL: NT CFL: NT Deltoid: NT Str: 5/5 Other Special tests: none Sensation: intact   Dg Ankle Complete Left  11/03/2013   CLINICAL DATA:  Trauma.  EXAM: LEFT ANKLE COMPLETE - 3+ VIEW  COMPARISON:  10/06/2013.  FINDINGS: Diffuse soft tissue swelling. No evidence of fracture or dislocation.  IMPRESSION: Diffuse soft tissue swelling.  No acute abnormality .   Electronically Signed   By: Maisie Fus  Register   On: 11/03/2013 14:30   Dg Ankle Complete Left  10/06/2013   CLINICAL DATA:  Left ankle injury.  EXAM: LEFT ANKLE COMPLETE - 3+ VIEW  COMPARISON:  None.  FINDINGS: There is no evidence of fracture, dislocation, or joint effusion. There is no evidence of arthropathy or other focal bone abnormality. Soft tissues are unremarkable.  IMPRESSION: Normal left ankle.   Electronically Signed   By: Roque Lias M.D.   On: 10/06/2013 11:23    Assessment & Plan:    Left ankle pain - Plan: DG Ankle Complete Left  Anorgasmia of female  >25 minutes spent in face to face time with patient, >50% spent in counselling or coordination of care: additional time spent answering sexual history questions.  Historically and clinically by examination consistent with left lateral malleolar bone contusion with probable delayed healing secondary to weight bearing status and walking throughout. I suspect that she will heal and do perfectly well. I am going to immobilizer for a brief period over the next few weeks, and I placed her in a pneumatic short Cam Walker boot.  I tried to discuss her anorgasmia. This may be secondary to her antidepressants. I discussed that having an open dialogue with her husband is certainly appropriate, but there is no easy solution to  this.  If additional followup is needed in this case, medical care outside of our office will be needed once the 33 day post dismissal has passed.  Orders Today:  Orders Placed This Encounter  Procedures  . DG Ankle Complete Left    New medications, updates to list, dose adjustments: No orders of the defined types were placed in this encounter.    Signed,  Elpidio Galea. Alyxis Grippi, MD, CAQ Sports Medicine  [facility] [address] [place] [address] Phone: [phone] Fax: [phone]  Updated Complete Medication List:   Medication List       This list is accurate as of: 11/03/13  6:02 PM.  Always use your most recent med list.               ALPRAZolam 0.5 MG tablet  Commonly known as:  Prudy Feeler  Take  0.5 mg by mouth at bedtime as needed.     progesterone 100 MG capsule  Commonly known as:  PROMETRIUM  Take 100 mg by mouth daily.     thyroid 120 MG tablet  Commonly known as:  ARMOUR  Take 90 mg by mouth daily.     VERAPAMIL HCL PO  Take 2 tablets by mouth at bedtime.     Vitamin D (Ergocalciferol) 50000 UNITS Caps capsule  Commonly known as:  DRISDOL  Take 50,000 Units by mouth 2 (two) times a week.

## 2013-11-03 NOTE — Progress Notes (Signed)
Pre-visit discussion using our clinic review tool. No additional management support is needed unless otherwise documented below in the visit note.  

## 2013-11-07 ENCOUNTER — Telehealth: Payer: Self-pay

## 2013-11-07 NOTE — Telephone Encounter (Signed)
Triage Record Num: [id number] Operator: Albertine Grates Patient Name: Ariel King Call Date & Time: 11/05/2013 10:50:14PM Patient Phone: PCP: Eustaquio Boyden Patient Gender: Female PCP Fax : [phone] Patient DOB: 1971/06/29 Practice Name: [facility] Reason for Call: Caller: Pleasant/Patient; PCP: Eustaquio Boyden [facility]); CB#: [phone]; States sprained ankle "3 weeks ago" and was seen in office. Was seen in office 12-23 and put in a different "boot". Did not give anything for pain and has been taking Motrin and Aleve. Has not helped. Per Ankle Injury protocol advised ED due to wearing splint and worsening pain. Protocol(s) Used: Ankle Injury Recommended Outcome per Protocol: See ED Immediately Reason for Outcome: Wearing cast or splint AND new or worsening pain, swelling, numbness, tingling, coolness or change in color that is NOT improved by elevation for 30 minutes OR not resolved within 2 hours Care Advice: ~ 12/

## 2013-11-14 NOTE — Telephone Encounter (Signed)
Pt left v/m has not heard from 11/05/13 call about lt ankle sprain and pt request cb about getting pain medication.

## 2013-11-18 ENCOUNTER — Telehealth: Payer: Self-pay

## 2013-11-18 NOTE — Telephone Encounter (Signed)
Pt left v/m ankle is still swollen and painful; pt is still wearing boot but pt request cb.

## 2014-02-03 ENCOUNTER — Encounter: Payer: Self-pay | Admitting: Internal Medicine

## 2014-02-03 ENCOUNTER — Telehealth: Payer: Self-pay | Admitting: Internal Medicine

## 2014-02-03 ENCOUNTER — Ambulatory Visit: Payer: BC Managed Care – PPO | Attending: Internal Medicine | Admitting: Internal Medicine

## 2014-02-03 VITALS — BP 109/75 | HR 91 | Temp 98.2°F | Resp 16 | Ht 64.0 in | Wt 205.0 lb

## 2014-02-03 DIAGNOSIS — E039 Hypothyroidism, unspecified: Secondary | ICD-10-CM | POA: Diagnosis not present

## 2014-02-03 DIAGNOSIS — Z0000 Encounter for general adult medical examination without abnormal findings: Secondary | ICD-10-CM | POA: Diagnosis not present

## 2014-02-03 DIAGNOSIS — G9332 Myalgic encephalomyelitis/chronic fatigue syndrome: Secondary | ICD-10-CM | POA: Insufficient documentation

## 2014-02-03 DIAGNOSIS — Z87891 Personal history of nicotine dependence: Secondary | ICD-10-CM | POA: Insufficient documentation

## 2014-02-03 DIAGNOSIS — G8929 Other chronic pain: Secondary | ICD-10-CM | POA: Insufficient documentation

## 2014-02-03 DIAGNOSIS — N189 Chronic kidney disease, unspecified: Secondary | ICD-10-CM | POA: Insufficient documentation

## 2014-02-03 DIAGNOSIS — Z833 Family history of diabetes mellitus: Secondary | ICD-10-CM | POA: Insufficient documentation

## 2014-02-03 DIAGNOSIS — F329 Major depressive disorder, single episode, unspecified: Secondary | ICD-10-CM | POA: Insufficient documentation

## 2014-02-03 DIAGNOSIS — F411 Generalized anxiety disorder: Secondary | ICD-10-CM | POA: Insufficient documentation

## 2014-02-03 DIAGNOSIS — Z79899 Other long term (current) drug therapy: Secondary | ICD-10-CM | POA: Insufficient documentation

## 2014-02-03 DIAGNOSIS — F3289 Other specified depressive episodes: Secondary | ICD-10-CM | POA: Insufficient documentation

## 2014-02-03 DIAGNOSIS — M545 Low back pain, unspecified: Secondary | ICD-10-CM | POA: Insufficient documentation

## 2014-02-03 DIAGNOSIS — R5382 Chronic fatigue, unspecified: Secondary | ICD-10-CM | POA: Insufficient documentation

## 2014-02-03 DIAGNOSIS — G43909 Migraine, unspecified, not intractable, without status migrainosus: Secondary | ICD-10-CM | POA: Insufficient documentation

## 2014-02-03 LAB — CBC WITH DIFFERENTIAL/PLATELET
Basophils Absolute: 0 10*3/uL (ref 0.0–0.1)
Basophils Relative: 0 % (ref 0–1)
Eosinophils Absolute: 0.1 10*3/uL (ref 0.0–0.7)
Eosinophils Relative: 1 % (ref 0–5)
HCT: 40.5 % (ref 36.0–46.0)
Hemoglobin: 13.8 g/dL (ref 12.0–15.0)
Lymphocytes Relative: 26 % (ref 12–46)
Lymphs Abs: 2.5 10*3/uL (ref 0.7–4.0)
MCH: 28.8 pg (ref 26.0–34.0)
MCHC: 34.1 g/dL (ref 30.0–36.0)
MCV: 84.6 fL (ref 78.0–100.0)
Monocytes Absolute: 1 10*3/uL (ref 0.1–1.0)
Monocytes Relative: 10 % (ref 3–12)
Neutro Abs: 6 10*3/uL (ref 1.7–7.7)
Neutrophils Relative %: 63 % (ref 43–77)
Platelets: 255 10*3/uL (ref 150–400)
RBC: 4.79 MIL/uL (ref 3.87–5.11)
RDW: 14.3 % (ref 11.5–15.5)
WBC: 9.5 10*3/uL (ref 4.0–10.5)

## 2014-02-03 LAB — COMPLETE METABOLIC PANEL WITH GFR
ALT: 14 U/L (ref 0–35)
AST: 13 U/L (ref 0–37)
Albumin: 4.5 g/dL (ref 3.5–5.2)
Alkaline Phosphatase: 59 U/L (ref 39–117)
BUN: 12 mg/dL (ref 6–23)
CO2: 27 mEq/L (ref 19–32)
Calcium: 10 mg/dL (ref 8.4–10.5)
Chloride: 101 mEq/L (ref 96–112)
Creat: 0.75 mg/dL (ref 0.50–1.10)
GFR, Est African American: >89 mL/min
GFR, Est Non African American: >89 mL/min
Glucose, Bld: 101 mg/dL — ABNORMAL HIGH (ref 70–99)
Potassium: 4.6 mEq/L (ref 3.5–5.3)
Sodium: 138 mEq/L (ref 135–145)
Total Bilirubin: 0.4 mg/dL (ref 0.2–1.2)
Total Protein: 7.2 g/dL (ref 6.0–8.3)

## 2014-02-03 LAB — POCT GLYCOSYLATED HEMOGLOBIN (HGB A1C): Hemoglobin A1C: 5.2

## 2014-02-03 LAB — LIPID PANEL
Cholesterol: 165 mg/dL (ref 0–200)
HDL: 51 mg/dL (ref >39)
LDL Cholesterol: 77 mg/dL (ref 0–99)
Total CHOL/HDL Ratio: 3.2 Ratio
Triglycerides: 186 mg/dL — ABNORMAL HIGH (ref <150)
VLDL: 37 mg/dL (ref 0–40)

## 2014-02-03 MED ORDER — VERAPAMIL HCL 120 MG PO TABS: ORAL_TABLET | ORAL | Status: DC

## 2014-02-03 MED ORDER — THYROID 120 MG PO TABS: 90.0000 mg | ORAL_TABLET | Freq: Every day | ORAL | Status: DC

## 2014-02-03 NOTE — Telephone Encounter (Signed)
[facility] needs to clarify the script/directions for verapamil (CALAN) 120 MG tablet. Please f/u with pharm

## 2014-02-03 NOTE — Progress Notes (Signed)
Pt is here to establish care. Pt has a history of depression and anxiety.

## 2014-02-03 NOTE — Progress Notes (Signed)
Patient ID: Ariel King, female   DOB: 1971/04/24, 43 y.o.   MRN: [id number]   CC:  HPI: 43 year old female here to establish care. Her primary complaint is depression/anxiety, she denies any suicidal homicidal ideation. She is a multiple psychiatric medications. She is unable to sleep well at night. She is always anxious and complains of significant memory issues.   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 She is status post hysterectomy but continues to get annual Pap smears.   She has a history of chronic low back pain for which she was taking Ultram. Now discontinued. She also states that she has a history of brain atrophy and chronic migraines and sees Dr. Merilyn Baba in [facility]. He has been prescribing her verapamil and recently switched her to nortriptyline. This medication makes her very drowsy and the patient wants to switch back to verapamil and is requesting a refill.   SURGICAL HISTORY: 1986, cervical conization, 2000 left wrist surgery,  2004 diagnostic laparoscopy. The patient reports anesthesia causes her  severe vomiting. She denies any history of blood transfusions.  FAMILY HISTORY: Cardiovascular disease, prostate and lung cancer,  thyroid disease, diabetes mellitus, hypertension, thyroid disease  SOCIAL HISTORY: The patient is married , nonsmoker nonalcoholic       Allergies  Allergen Reactions  . Naltrexone Other (See Comments)    Panic attack   Past Medical History  Diagnosis Date  . Thyroid disease   . Chronic kidney disease   . Incontinence   . Hx of migraines   . Anemia   . H/O bladder infections   . Anxiety   . H/O joint problems   . Abnormal Pap smear   . Chronic fatigue and immune dysfunction syndrome   . Depression   . EBV infection   . Depression   . Bladder disorder    Current Outpatient Prescriptions on File Prior to Visit  Medication Sig Dispense Refill  . ALPRAZolam  (XANAX) 0.5 MG tablet Take 0.5 mg by mouth at bedtime as needed.      Marland Kitchen VERAPAMIL HCL PO Take 2 tablets by mouth at bedtime.      . progesterone (PROMETRIUM) 100 MG capsule Take 100 mg by mouth daily.      . Vitamin D, Ergocalciferol, (DRISDOL) 50000 UNITS CAPS Take 50,000 Units by mouth 2 (two) times a week.       No current facility-administered medications on file prior to visit.   Family History  Problem Relation Age of Onset  . Diabetes Mother   . Migraines Mother    History   Social History  . Marital Status: Married    Spouse Name: N/A    Number of Children: N/A  . Years of Education: N/A   Occupational History  . Not on file.   Social History Main Topics  . Smoking status: Former Smoker    Types: Cigarettes  . Smokeless tobacco: Never Used     Comment: quit 10 years ago  . Alcohol Use: No  . Drug Use: No  . Sexual Activity: Yes    Partners: Male    Birth Control/ Protection: Other-see comments     Comment: hyst   Other Topics Concern  . Not on file   Social History Narrative  . No narrative on file    Review of Systems  Constitutional: Negative for fever, chills, diaphoresis, activity change, appetite change and fatigue.  HENT: Negative for  ear pain, nosebleeds, congestion, facial swelling, rhinorrhea, neck pain, neck stiffness and ear discharge.   Eyes: Negative for pain, discharge, redness, itching and visual disturbance.  Respiratory: Negative for cough, choking, chest tightness, shortness of breath, wheezing and stridor.   Cardiovascular: Negative for chest pain, palpitations and leg swelling.  Gastrointestinal: Negative for abdominal distention.  Genitourinary: Negative for dysuria, urgency, frequency, hematuria, flank pain, decreased urine volume, difficulty urinating and dyspareunia.  Musculoskeletal: Negative for back pain, joint swelling, arthralgias and gait problem.  Neurological: As in history of present illness Hematological: Negative for  adenopathy. Does not bruise/bleed easily.  Psychiatric/Behavioral: Negative for hallucinations, behavioral problems, confusion, dysphoric mood, decreased concentration and agitation.    Objective:   Filed Vitals:   02/03/14 0909  BP: 109/75  Pulse: 91  Temp: 98.2 F (36.8 C)  Resp: 16    Physical Exam  Constitutional: Appears well-developed and well-nourished. No distress.  HENT: Normocephalic. External right and left ear normal. Oropharynx is clear and moist.  Eyes: Conjunctivae and EOM are normal. PERRLA, no scleral icterus.  Neck: Normal ROM. Neck supple. No JVD. No tracheal deviation. No thyromegaly.  CVS: RRR, S1/S2 +, no murmurs, no gallops, no carotid bruit.  Pulmonary: Effort and breath sounds normal, no stridor, rhonchi, wheezes, rales.  Abdominal: Soft. BS +,  no distension, tenderness, rebound or guarding.  Musculoskeletal: Normal range of motion. No edema and no tenderness.  Lymphadenopathy: No lymphadenopathy noted, cervical, inguinal. Neuro: Alert. Normal reflexes, muscle tone coordination. No cranial nerve deficit. Skin: Skin is warm and dry. No rash noted. Not diaphoretic. No erythema. No pallor.  Psychiatric: Normal mood and affect. Behavior, judgment, thought content normal.   Lab Results  Component Value Date   WBC 13.6* 08/24/2007   HGB 10.5 DELTA CHECK NOTED* 08/24/2007   HCT 29.7* 08/24/2007   MCV 87.3 08/24/2007   PLT 277 08/24/2007   Lab Results  Component Value Date   CREATININE 0.60 08/20/2007   BUN 9 08/20/2007   NA 136 08/20/2007   K 4.1 08/20/2007   CL 97 08/20/2007   CO2 29 08/20/2007    No results found for this basename: HGBA1C   Lipid Panel  No results found for this basename: chol, trig, hdl, cholhdl, vldl, ldlcalc       Assessment and plan:   Patient Active Problem List   Diagnosis Date Noted  . Obesity (BMI 30-39.9) 11/03/2013  . Left ankle injury 10/06/2013  . Thyroid disease 03/14/2012  . Anxiety 03/14/2012  . Migraines  03/14/2012  . Depression 03/14/2012  . Chronic fatigue disorder 03/14/2012  . Decreased libido 03/14/2012  . Urinary tract disorder 03/14/2012       Depression/anxiety Patient is on multiple psychiatric medications and is followed by a psychiatrist in [place] work consultation was obtained I am encouraged the patient after the psychiatrist to see if she can skin back on some of these medications Also instructed her not to drive while she is on all the psychotropic medications    Chronic fatigue syndrome/hypothyroidism Patient is on Armour Will check TSH, free T4   Establish care Scheduled patient for mammogram Pap smear Recently had STD screening because of marital infidelity. No similar complaints today she is still married  Patient to follow up in 3-4 months  The patient was given clear instructions to go to ER or return to medical center if symptoms don't improve, worsen or new problems develop. The patient verbalized understanding. The patient was told to call to get  any lab results if not heard anything in the next week.

## 2014-02-03 NOTE — Progress Notes (Signed)
LCSW met with patient in order to assess mental health needs. Patient stated that she has dealt with depression for years but it has been fairly well managed with medication.  Patient stated that she currently has a psychiatrist but has been recommended that she see a psychotherapist in order to address emotional issues and cognitive issues that may be connected to brain atrophy.  LCSW encouraged patient to seek recommendation from psychiatrist and migraine/headache specialist. LCSW encouraged patient to follow up in order to ensure that she is receiving appropriate mental health care.    Christene Lye MSW, LCSW

## 2014-02-04 ENCOUNTER — Other Ambulatory Visit: Payer: Self-pay | Admitting: Internal Medicine

## 2014-02-04 ENCOUNTER — Ambulatory Visit: Payer: BC Managed Care – PPO

## 2014-02-04 DIAGNOSIS — Z1231 Encounter for screening mammogram for malignant neoplasm of breast: Secondary | ICD-10-CM

## 2014-02-04 LAB — T3, FREE: T3, Free: 2.7 pg/mL (ref 2.3–4.2)

## 2014-02-04 LAB — T4, FREE: Free T4: 1.34 ng/dL (ref 0.80–1.80)

## 2014-02-04 LAB — TSH: TSH: 0.186 u[IU]/mL — ABNORMAL LOW (ref 0.350–4.500)

## 2014-02-04 LAB — VITAMIN D 25 HYDROXY (VIT D DEFICIENCY, FRACTURES): Vit D, 25-Hydroxy: 42 ng/mL (ref 30–89)

## 2014-02-04 NOTE — Telephone Encounter (Signed)
Already changed and clarified

## 2014-02-10 ENCOUNTER — Telehealth: Payer: Self-pay | Admitting: Emergency Medicine

## 2014-02-10 NOTE — Telephone Encounter (Signed)
Left message for pt to call clinic

## 2014-02-10 NOTE — Telephone Encounter (Signed)
Pt given normal lab results 

## 2014-02-10 NOTE — Telephone Encounter (Signed)
Message copied by Ricci Barker on Tue Feb 10, 2014  3:53 PM ------      Message from: Allyson Sabal MD, Ascencion Dike      Created: Wed Feb 04, 2014  9:47 AM       Please notify patient of the labs are normal ------

## 2014-02-23 ENCOUNTER — Telehealth: Payer: Self-pay | Admitting: *Deleted

## 2014-02-23 NOTE — Telephone Encounter (Signed)
Patient has called in stating that she needs a letter for her social security disabliity. This letter needs to state that she is unable to work and is not in the right frame of mind to work either. Please call patient when this can be wrote up

## 2014-03-06 ENCOUNTER — Telehealth: Payer: Self-pay | Admitting: *Deleted

## 2014-03-06 NOTE — Telephone Encounter (Signed)
Called to return patient call. No answer. Left a voicemail to give Ariel King a call back. Alverda Skeans, RN

## 2014-03-10 ENCOUNTER — Telehealth: Payer: Self-pay | Admitting: *Deleted

## 2014-03-10 NOTE — Telephone Encounter (Signed)
Patient called wanting to know her thyroid results and the time of her mammogram appointment. Informed patient of her thyroid results and that her mammogram appointment is [id number] at 9:20AM. Patient verbalized understanding. Alverda Skeans, RN

## 2014-03-13 ENCOUNTER — Ambulatory Visit: Payer: BC Managed Care – PPO

## 2014-03-16 ENCOUNTER — Telehealth: Payer: Self-pay | Admitting: *Deleted

## 2014-03-16 NOTE — Telephone Encounter (Signed)
Patient called with stating her stomach is hard. Patient states it has been this way for 3 weeks, denies nausea and vomiting. Patient states she is having regular bowel movements but think this could be because she has gained 30 pounds since winter. Informed patient the next available is 03/24/2014 patient agreed to take this appointment. Informed patient that she can go to Urgent Care. Patient verbalized understanding. Alverda Skeans, RN

## 2014-03-24 ENCOUNTER — Ambulatory Visit: Payer: BC Managed Care – PPO | Admitting: Internal Medicine

## 2014-04-13 ENCOUNTER — Other Ambulatory Visit: Payer: BC Managed Care – PPO

## 2014-04-13 ENCOUNTER — Encounter: Payer: BC Managed Care – PPO | Admitting: Internal Medicine

## 2014-04-27 ENCOUNTER — Other Ambulatory Visit: Payer: Self-pay | Admitting: Internal Medicine

## 2014-06-05 ENCOUNTER — Ambulatory Visit: Payer: BC Managed Care – PPO | Admitting: Internal Medicine

## 2014-08-31 LAB — TSH: TSH: 0.03

## 2014-08-31 LAB — T3, FREE: T3, Free: 2.83

## 2014-09-14 ENCOUNTER — Encounter: Payer: Self-pay | Admitting: Internal Medicine

## 2014-10-01 LAB — TSH: TSH: 0.11

## 2014-10-01 LAB — CBC AND DIFFERENTIAL
Hemoglobin: 13.9 g/dL (ref 12.0–16.0)
Platelets: 255 10*3/uL (ref 150–399)
WBC: 9.7 10^3/mL

## 2014-10-01 LAB — VITAMIN D 25 HYDROXY (VIT D DEFICIENCY, FRACTURES): Vit D, 25-Hydroxy: 44.3

## 2014-10-01 LAB — T4, FREE: T4,Free (Direct): 1.12

## 2014-10-01 LAB — VITAMIN B12: Vitamin B12: 1151

## 2015-12-19 DIAGNOSIS — E039 Hypothyroidism, unspecified: Secondary | ICD-10-CM | POA: Insufficient documentation

## 2015-12-19 HISTORY — DX: Hypothyroidism, unspecified: E03.9

## 2016-01-20 LAB — HEPATIC FUNCTION PANEL
ALT: 19 U/L (ref 7–35)
AST: 15 U/L (ref 13–35)
Bilirubin, Total: 0.2 mg/dL

## 2016-01-20 LAB — BASIC METABOLIC PANEL
BUN: 11 mg/dL (ref 4–21)
Creatinine: 0.9 mg/dL (ref 0.5–1.1)
Glucose: 115 mg/dL
Potassium: 3.9 mmol/L (ref 3.4–5.3)
Sodium: 140 mmol/L (ref 137–147)

## 2016-05-08 ENCOUNTER — Ambulatory Visit: Payer: BC Managed Care – PPO | Admitting: Family Medicine

## 2016-05-17 ENCOUNTER — Ambulatory Visit: Payer: BC Managed Care – PPO | Admitting: Family Medicine

## 2016-05-25 ENCOUNTER — Encounter: Payer: Self-pay | Admitting: Family Medicine

## 2016-05-25 ENCOUNTER — Ambulatory Visit (INDEPENDENT_AMBULATORY_CARE_PROVIDER_SITE_OTHER): Payer: BC Managed Care – PPO | Admitting: Family Medicine

## 2016-05-25 VITALS — BP 113/74 | HR 90 | Ht 63.75 in | Wt 180.5 lb

## 2016-05-25 DIAGNOSIS — F329 Major depressive disorder, single episode, unspecified: Secondary | ICD-10-CM | POA: Diagnosis not present

## 2016-05-25 DIAGNOSIS — R5382 Chronic fatigue, unspecified: Secondary | ICD-10-CM

## 2016-05-25 DIAGNOSIS — M797 Fibromyalgia: Secondary | ICD-10-CM

## 2016-05-25 DIAGNOSIS — G9332 Myalgic encephalomyelitis/chronic fatigue syndrome: Secondary | ICD-10-CM

## 2016-05-25 DIAGNOSIS — E038 Other specified hypothyroidism: Secondary | ICD-10-CM

## 2016-05-25 DIAGNOSIS — F419 Anxiety disorder, unspecified: Secondary | ICD-10-CM

## 2016-05-25 DIAGNOSIS — F32A Depression, unspecified: Secondary | ICD-10-CM

## 2016-05-25 DIAGNOSIS — R739 Hyperglycemia, unspecified: Secondary | ICD-10-CM

## 2016-05-25 DIAGNOSIS — E781 Pure hyperglyceridemia: Secondary | ICD-10-CM

## 2016-05-25 DIAGNOSIS — E669 Obesity, unspecified: Secondary | ICD-10-CM | POA: Diagnosis not present

## 2016-05-25 NOTE — Progress Notes (Signed)
Marjory Sneddon, D.O. Primary care at [facility]:    Chief Complaint  Patient presents with  . Establish Care   New pt, here to establish care.   HPI: Ariel King is a pleasant 45 y.o. female who presents to [place] at [facility] today to review her medical history so we can become acquainted and establish care.   PCP prior: Patient does not know, but thinks she used to go to [facility], [employer], historic buildings. Endocrinologist: Dr. Posey Pronto, whom she sees for her thyroid disease and chronic fatigue syndrome. Psychiatrist: Dr. Toy Care,  ? Diagnosis.  I do not have access to her records to review. She is on multiple medications and psychotropic drugs.  Patient is a poor historian/ or possibly has altered mental status due to polypharmacy and cannot tell me much about her various chronic disease processes and what she has been diagnosed with, txed with etc.   she is married, nonsmoker, no alcohol.   Past Medical History  Diagnosis Date  . Thyroid disease   . Chronic kidney disease   . Incontinence   . Hx of migraines   . Anemia   . H/O bladder infections   . Anxiety   . H/O joint problems   . Abnormal Pap smear   . Chronic fatigue and immune dysfunction syndrome   . Depression   . EBV infection   . Depression   . Bladder disorder       Past Surgical History  Procedure Laterality Date  . Robotic assisted lap vaginal hysterectomy    . Synovectomy wrist        Family History  Problem Relation Age of Onset  . Diabetes Mother   . Migraines Mother   . Heart attack Mother   . Depression Mother   . Hypertension Mother   . Alcohol abuse Father   . Cancer Father     prostate  . Depression Sister       History  Drug Use No  ,    History  Alcohol Use No  ,    History  Smoking status  . Former Smoker  . Types: Cigarettes  Smokeless tobacco  . Never Used    Comment: quit 10 years ago  ,     History  Sexual  Activity  . Sexual Activity:  . Partners: Male  . Birth Control/ Protection: Other-see comments    Comment: hyst      Patient's Medications  New Prescriptions   No medications on file  Previous Medications   ALPRAZOLAM (XANAX) 0.5 MG TABLET    Take 1-2 tablets by mouth every 6 (six) hours as needed.   AMPHETAMINE-DEXTROAMPHETAMINE (ADDERALL) 20 MG TABLET    Take 1 tablet by mouth 3 (three) times daily.   LEVOTHYROXINE (SYNTHROID, LEVOTHROID) 137 MCG TABLET    Take by mouth. Take one tablet Monday thru Saturday and take 1/2 tablet every Sunday   QUETIAPINE (SEROQUEL) 100 MG TABLET    Take 100 mg by mouth as needed.   TRAZODONE (DESYREL) 100 MG TABLET    Take 2 tablets by mouth at bedtime.   VENLAFAXINE XR (EFFEXOR-XR) 75 MG 24 HR CAPSULE    Take 3 capsules by mouth every morning.   VITAMIN D, ERGOCALCIFEROL, (DRISDOL) 50000 UNITS CAPS    Take 50,000 Units by mouth 2 (two) times a week.  Modified Medications   No medications on file  Discontinued Medications  ALPRAZOLAM (XANAX) 0.5 MG TABLET    Take 0.5 mg by mouth at bedtime as needed.   ARMOUR THYROID 120 MG TABLET    TAKE 1 TABLET (120 MG TOTAL) BY MOUTH DAILY.   METOCLOPRAMIDE (REGLAN) 5 MG TABLET    Take 5 mg by mouth 2 (two) times daily as needed for nausea.   NORTRIPTYLINE (PAMELOR) 10 MG CAPSULE    Take 10 mg by mouth 3 (three) times daily.   PREGABALIN (LYRICA) 150 MG CAPSULE    Take 150 mg by mouth 3 (three) times daily.   PROGESTERONE (PROMETRIUM) 100 MG CAPSULE    Take 100 mg by mouth daily.   QUETIAPINE (SEROQUEL) 100 MG TABLET    Take 100 mg by mouth at bedtime.   THYROID (ARMOUR) 120 MG TABLET    Take 1 tablet (120 mg total) by mouth daily.   VENLAFAXINE XR (EFFEXOR-XR) 75 MG 24 HR CAPSULE    Take 75 mg by mouth 3 (three) times daily.   VERAPAMIL (CALAN) 120 MG TABLET    2 tablets at bedtime   ZOLPIDEM (AMBIEN) 10 MG TABLET    Take 10 mg by mouth at bedtime.    Naltrexone    Fall Risk  02/03/2014  Falls in the  past year? Yes  Number falls in past yr: 2 or more  Risk Factor Category  High Fall Risk     Depression screen [facility] 2/9 02/03/2014  Decreased Interest 1  Down, Depressed, Hopeless 2  PHQ - 2 Score 3  Altered sleeping 3  Tired, decreased energy 3  Change in appetite 2  Feeling bad or failure about yourself  2  Trouble concentrating 1  Moving slowly or fidgety/restless 0  Suicidal thoughts 0  PHQ-9 Score 14    Review of Systems:   ( Completed via Adult Medical History Intake form today ) General:   Denies fever, chills, appetite changes, unexplained weight loss.  Optho/Auditory:   Denies visual changes, blurred vision/LOV, ringing in ears/ diff hearing Respiratory:   Denies SOB, DOE, cough, wheezing.  Cardiovascular:   Denies chest pain, palpitations, new onset peripheral edema  Gastrointestinal:   Denies nausea, vomiting, diarrhea.  Genitourinary:    Denies dysuria, increased frequency, flank pain.  Endocrine:     Denies hot or cold intolerance, polyuria, polydipsia. Musculoskeletal:  Denies unexplained myalgias, joint swelling, arthralgias, gait problems.  Skin:  Denies rash, suspicious lesions or new/ changes in moles Neurological:    Denies dizziness, syncope, unexplained weakness, lightheadedness, numbness  Psychiatric/Behavioral:   Denies mood changes, suicidal or homicidal ideations, hallucinations    Objective:   Blood pressure 113/74, pulse 90, height 5' 3.75" (1.619 m), weight 180 lb 8 oz (81.874 kg). Body mass index is 31.24 kg/(m^2).  General: Well Developed, well nourished, and in no acute distress.  Neuro: Alert and oriented x3, extra-ocular muscles intact, sensation grossly intact.  HEENT: Normocephalic, atraumatic, pupils equal round reactive to light, neck supple, no gross masses, no carotid bruits, no JVD apprec Skin: no gross suspicious lesions or rashes  Cardiac: Regular rate and rhythm, no murmurs rubs or gallops.  Respiratory: Essentially clear to  auscultation bilaterally. Not using accessory muscles, speaking in full sentences.  Abdominal: Soft, not grossly distended Musculoskeletal: Ambulates w/o diff, FROM * 4 ext.  Vasc: less 2 sec cap RF, warm and pink  Psych:  No HI/SI, judgement and insight good.   Impression and Recommendations:    The patient was counseled, risk factors were discussed, anticipatory  guidance given.  Adult hypothyroidism It appears patient is on levothyroxine for presumable hypothyroidism. We will obtain blood work in the near future. Asked patient to make a follow-up office visit for this in the near future at her convenience.  Fibromyalgia Patient does not know who is treating her fibromyalgia.   I asked her please give me a list of her all her specialists that she sees and we can update the chart next office visit.  Anxiety Patient appears to have some cognitive dysfunction today and I question whether or not this is due to polypharmacy. I advised patient to follow-up with her psychiatrist she doesn't already have a follow-up appointment in the very near future. She is in the high fall risk category and I think it's best she see her psychiatrist and possibly get adjustments to her medications.   Her PHQ-9 scores totaled 14 today. Not sure what her baseline is  She denies any suicidal or homicidal thoughts.  Blood glucose elevated Patient cannot confirm nor deny whether or not she has a history of glucose intolerance or diabetes.  We will obtain lab work in the near future to screen for diabetes and prediabetes.  Hypertriglyceridemia We'll obtain blood work in the near future.  Patient cannot confirm nor deny that she has a history of hypertriglyceridemia but it was on her problem list.  Does not appear she is on any cholesterol medications.  Obesity (BMI 30-39.9) Focus on healthy habits and healthy eating\exercising daily.   Gross side effects, risk and benefits, and alternatives of medications  discussed with patient.  Patient is aware that all medications have potential side effects and we are unable to predict every side effect or drug-drug interaction that may occur.  Expresses verbal understanding and consents to current therapy plan and treatment regimen.  Please see AVS handed out to patient at the end of our visit for further patient instructions/ counseling done pertaining to today's office visit.    @LABCOMPINFO @  No orders of the defined types were placed in this encounter.     Note: This document was prepared using Dragon voice recognition software and may include unintentional dictation errors.

## 2016-05-25 NOTE — Patient Instructions (Addendum)
Next OV- bring in list of all your CURRENT MEDS  Bring in list of your CURRENT PHYSICIANS you see;  bring in names of all specialists

## 2016-05-30 ENCOUNTER — Other Ambulatory Visit: Payer: BC Managed Care – PPO

## 2016-05-30 DIAGNOSIS — Z1321 Encounter for screening for nutritional disorder: Secondary | ICD-10-CM

## 2016-05-30 DIAGNOSIS — Z131 Encounter for screening for diabetes mellitus: Secondary | ICD-10-CM

## 2016-05-30 DIAGNOSIS — E079 Disorder of thyroid, unspecified: Secondary | ICD-10-CM

## 2016-05-30 DIAGNOSIS — E781 Pure hyperglyceridemia: Secondary | ICD-10-CM

## 2016-05-31 LAB — COMPLETE METABOLIC PANEL WITH GFR
ALT: 17 U/L (ref 6–29)
AST: 15 U/L (ref 10–35)
Albumin: 4.1 g/dL (ref 3.6–5.1)
Alkaline Phosphatase: 60 U/L (ref 33–115)
BUN: 8 mg/dL (ref 7–25)
CO2: 25 mmol/L (ref 20–31)
Calcium: 8.9 mg/dL (ref 8.6–10.2)
Chloride: 99 mmol/L (ref 98–110)
Creat: 0.77 mg/dL (ref 0.50–1.10)
GFR, Est African American: >89 mL/min (ref >=60)
GFR, Est Non African American: >89 mL/min (ref >=60)
Glucose, Bld: 98 mg/dL (ref 65–99)
Potassium: 4.3 mmol/L (ref 3.5–5.3)
Sodium: 134 mmol/L — ABNORMAL LOW (ref 135–146)
Total Bilirubin: 0.4 mg/dL (ref 0.2–1.2)
Total Protein: 6.6 g/dL (ref 6.1–8.1)

## 2016-05-31 LAB — CBC WITH DIFFERENTIAL/PLATELET
Basophils Absolute: 0 cells/uL (ref 0–200)
Basophils Relative: 0 %
Eosinophils Absolute: 78 cells/uL (ref 15–500)
Eosinophils Relative: 1 %
HCT: 41.3 % (ref 35.0–45.0)
Hemoglobin: 14 g/dL (ref 11.7–15.5)
Lymphocytes Relative: 26 %
Lymphs Abs: 2028 cells/uL (ref 850–3900)
MCH: 31.5 pg (ref 27.0–33.0)
MCHC: 33.9 g/dL (ref 32.0–36.0)
MCV: 92.8 fL (ref 80.0–100.0)
MPV: 9.6 fL (ref 7.5–12.5)
Monocytes Absolute: 1014 cells/uL — ABNORMAL HIGH (ref 200–950)
Monocytes Relative: 13 %
Neutro Abs: 4680 cells/uL (ref 1500–7800)
Neutrophils Relative %: 60 %
Platelets: 207 10*3/uL (ref 140–400)
RBC: 4.45 MIL/uL (ref 3.80–5.10)
RDW: 13.2 % (ref 11.0–15.0)
WBC: 7.8 10*3/uL (ref 3.8–10.8)

## 2016-05-31 LAB — VITAMIN D 25 HYDROXY (VIT D DEFICIENCY, FRACTURES): Vit D, 25-Hydroxy: 41 ng/mL (ref 30–100)

## 2016-05-31 LAB — LIPID PANEL
Cholesterol: 139 mg/dL (ref 125–200)
HDL: 46 mg/dL (ref >=46)
LDL Cholesterol: 41 mg/dL (ref <130)
Total CHOL/HDL Ratio: 3 Ratio (ref <=5.0)
Triglycerides: 259 mg/dL — ABNORMAL HIGH (ref <150)
VLDL: 52 mg/dL — ABNORMAL HIGH (ref <30)

## 2016-05-31 LAB — TSH: TSH: 0.12 mIU/L — ABNORMAL LOW

## 2016-05-31 LAB — HEMOGLOBIN A1C
Hgb A1c MFr Bld: 5.3 % (ref <5.7)
Mean Plasma Glucose: 105 mg/dL

## 2016-06-02 ENCOUNTER — Other Ambulatory Visit: Payer: BC Managed Care – PPO

## 2016-06-03 NOTE — Assessment & Plan Note (Signed)
Patient cannot confirm nor deny whether or not she has a history of glucose intolerance or diabetes.  We will obtain lab work in the near future to screen for diabetes and prediabetes.

## 2016-06-03 NOTE — Assessment & Plan Note (Addendum)
We'll obtain blood work in the near future.  Patient cannot confirm nor deny that she has a history of hypertriglyceridemia but it was on her problem list.  Does not appear she is on any cholesterol medications.

## 2016-06-03 NOTE — Assessment & Plan Note (Signed)
Patient does not know who is treating her fibromyalgia.   I asked her please give me a list of her all her specialists that she sees and we can update the chart next office visit.

## 2016-06-03 NOTE — Assessment & Plan Note (Addendum)
Patient appears to have some cognitive dysfunction today and I question whether or not this is due to polypharmacy. I advised patient to follow-up with her psychiatrist she doesn't already have a follow-up appointment in the very near future. She is in the high fall risk category and I think it's best she see her psychiatrist and possibly get adjustments to her medications.   Her PHQ-9 scores totaled 14 today. Not sure what her baseline is  She denies any suicidal or homicidal thoughts.

## 2016-06-03 NOTE — Assessment & Plan Note (Signed)
It appears patient is on levothyroxine for presumable hypothyroidism. We will obtain blood work in the near future. Asked patient to make a follow-up office visit for this in the near future at her convenience.

## 2016-06-03 NOTE — Assessment & Plan Note (Signed)
Focus on healthy habits and healthy eating\exercising daily.

## 2016-06-09 ENCOUNTER — Encounter: Payer: Self-pay | Admitting: Family Medicine

## 2016-06-09 ENCOUNTER — Ambulatory Visit (INDEPENDENT_AMBULATORY_CARE_PROVIDER_SITE_OTHER): Payer: BC Managed Care – PPO | Admitting: Family Medicine

## 2016-06-09 VITALS — BP 94/67 | HR 90 | Wt 177.4 lb

## 2016-06-09 DIAGNOSIS — F410 Panic disorder [episodic paroxysmal anxiety] without agoraphobia: Secondary | ICD-10-CM | POA: Insufficient documentation

## 2016-06-09 DIAGNOSIS — R739 Hyperglycemia, unspecified: Secondary | ICD-10-CM | POA: Diagnosis not present

## 2016-06-09 DIAGNOSIS — E781 Pure hyperglyceridemia: Secondary | ICD-10-CM

## 2016-06-09 DIAGNOSIS — Z90710 Acquired absence of both cervix and uterus: Secondary | ICD-10-CM | POA: Insufficient documentation

## 2016-06-09 DIAGNOSIS — E038 Other specified hypothyroidism: Secondary | ICD-10-CM

## 2016-06-09 DIAGNOSIS — E669 Obesity, unspecified: Secondary | ICD-10-CM | POA: Diagnosis not present

## 2016-06-09 DIAGNOSIS — E559 Vitamin D deficiency, unspecified: Secondary | ICD-10-CM

## 2016-06-09 MED ORDER — LEVOTHYROXINE SODIUM 125 MCG PO TABS: 125.0000 ug | ORAL_TABLET | Freq: Every day | ORAL | 3 refills | Status: DC

## 2016-06-09 MED ORDER — NIACIN ER (ANTIHYPERLIPIDEMIC) 1000 MG PO TBCR: 1000.0000 mg | EXTENDED_RELEASE_TABLET | Freq: Every day | ORAL | 1 refills | Status: DC

## 2016-06-09 MED ORDER — VITAMIN D (ERGOCALCIFEROL) 1.25 MG (50000 UNIT) PO CAPS: 50000.0000 [IU] | ORAL_CAPSULE | ORAL | 1 refills | Status: DC

## 2016-06-09 NOTE — Assessment & Plan Note (Signed)
[facility]

## 2016-06-09 NOTE — Assessment & Plan Note (Signed)
>>  ASSESSMENT AND PLAN FOR DEPRESSION WRITTEN ON 06/09/2016 10:04 AM BY Thomasene Lot, DO  Dr Evelene Croon  IN March 2017---> 20+ yr marriage ended.

## 2016-06-09 NOTE — Assessment & Plan Note (Signed)
Patient does endorse a past medical history of elevated sugars and/or possible elevated A1c

## 2016-06-09 NOTE — Assessment & Plan Note (Signed)
>>  ASSESSMENT AND PLAN FOR MIGRAINES WRITTEN ON 06/09/2016 10:02 AM BY Mellody Dance, DO  [facility]

## 2016-06-09 NOTE — Assessment & Plan Note (Signed)
Currently treated by her psych doc Dr Toy Care.  Pt does yoga, walks a mile a day.

## 2016-06-09 NOTE — Assessment & Plan Note (Signed)
Dr. Robina Ade gives patient trazodone to help with sleep.

## 2016-06-09 NOTE — Patient Instructions (Signed)
Take a multivitamin daily as well as a B complex vitamin. Make sure you do not take her thyroid medicine at the same time when you take your vitamins.     Guidelines for a Low Cholesterol, Low Saturated Fat Diet   Fats - Limit total intake of fats and oils. - Avoid butter, stick margarine, shortening, lard, palm and coconut oils. - Limit mayonnaise, salad dressings, gravies and sauces, unless they are homemade with low-fat ingredients. - Limit chocolate. - Choose low-fat and nonfat products, such as low-fat mayonnaise, low-fat or non-hydrogenated peanut butter, low-fat or fat-free salad dressings and nonfat gravy. - Use vegetable oil, such as canola or olive oil. - Look for margarine that does not contain trans fatty acids. - Use nuts in moderate amounts. - Read ingredient labels carefully to determine both amount and type of fat present in foods. Limit saturated and trans fats! - Avoid high-fat processed and convenience foods.  Meats and Meat Alternatives - Choose fish, chicken, [place] and lean meats. - Use dried beans, peas, lentils and tofu. - Limit egg yolks to three to four per week. - If you eat red meat, limit to no more than three servings per week and choose loin or round cuts. - Avoid fatty meats, such as bacon, sausage, franks, luncheon meats and ribs. - Avoid all organ meats, including liver.  Dairy - Choose nonfat or low-fat milk, yogurt and cottage cheese. - Most cheeses are high in fat. Choose cheeses made from non-fat milk, such as mozzarella and ricotta cheese. - Choose light or fat-free cream cheese and sour cream. - Avoid cream and sauces made with cream.  Fruits and Vegetables - Eat a wide variety of fruits and vegetables. - Use lemon juice, vinegar or "mist" olive oil on vegetables. - Avoid adding sauces, fat or oil to vegetables.  Breads, Cereals and Grains - Choose whole-grain breads, cereals, pastas and rice. - Avoid high-fat snack foods, such as  granola, cookies, pies, pastries, doughnuts and croissants.  Cooking Tips - Avoid deep fried foods. - Trim visible fat off meats and remove skin from poultry before cooking. - Bake, broil, boil, poach or roast poultry, fish and lean meats. - Drain and discard fat that drains out of meat as you cook it. - Add little or no fat to foods. - Use vegetable oil sprays to grease pans for cooking or baking. - Steam vegetables. - Use herbs or no-oil marinades to flavor foods.    Hyperthyroidism Hyperthyroidism is when the thyroid is too active (overactive). Your thyroid is a large gland that is located in your neck. The thyroid helps to control how your body uses food (metabolism). When your thyroid is overactive, it produces too much of a hormone called thyroxine.  CAUSES Causes of hyperthyroidism may include:  Graves disease. This is when your immune system attacks the thyroid gland. This is the most common cause.  Inflammation of the thyroid gland.  Tumor in the thyroid gland or somewhere else.  Excessive use of thyroid medicines, including:  Prescription thyroid supplement.  Herbal supplements that mimic thyroid hormones.  Solid or fluid-filled lumps within your thyroid gland (thyroid nodules).  Excessive ingestion of iodine. RISK FACTORS  Being female.  Having a family history of thyroid conditions. SIGNS AND SYMPTOMS Signs and symptoms of hyperthyroidism may include:  Nervousness.  Inability to tolerate heat.  Unexplained weight loss.  Diarrhea.  Change in the texture of hair or skin.  Heart skipping beats or making extra beats.  Rapid heart rate.  Loss of menstruation.  Shaky hands.  Fatigue.  Restlessness.  Increased appetite.  Sleep problems.  Enlarged thyroid gland or nodules. DIAGNOSIS  Diagnosis of hyperthyroidism may include:  Medical history and physical exam.  Blood tests.  Ultrasound tests. TREATMENT Treatment may  include:  Medicines to control your thyroid.  Surgery to remove your thyroid.  Radiation therapy. HOME CARE INSTRUCTIONS   Take medicines only as directed by your health care provider.  Do not use any tobacco products, including cigarettes, chewing tobacco, or electronic cigarettes. If you need help quitting, ask your health care provider.  Do not exercise or do physical activity until your health care provider approves.  Keep all follow-up appointments as directed by your health care provider. This is important. SEEK MEDICAL CARE IF:  Your symptoms do not get better with treatment.  You have fever.  You are taking thyroid replacement medicine and you:  Have depression.  Feel mentally and physically slow.  Have weight gain. SEEK IMMEDIATE MEDICAL CARE IF:   You have decreased alertness or a change in your awareness.  You have abdominal pain.  You feel dizzy.  You have a rapid heartbeat.  You have an irregular heartbeat.   This information is not intended to replace advice given to you by your health care provider. Make sure you discuss any questions you have with your health care provider.   Document Released: 10/30/2005 Document Revised: 11/20/2014 Document Reviewed: 03/17/2014 Elsevier Interactive Patient Education [employer].

## 2016-06-09 NOTE — Assessment & Plan Note (Signed)
Pt lost 48pounds since last summer.  She thinks she may have had a history of cholesterol problems in the past

## 2016-06-09 NOTE — Assessment & Plan Note (Addendum)
Dr Toy Care  IN March 2017---> 20+ yr marriage ended.
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
## 2016-06-09 NOTE — Assessment & Plan Note (Signed)
txed by dr Toy Care

## 2016-06-09 NOTE — Assessment & Plan Note (Signed)
About 4 yrs now- tiredness sx are improved, the patient suffers from chronic fatigue syndrome so she still is tired

## 2016-06-12 ENCOUNTER — Other Ambulatory Visit: Payer: Self-pay | Admitting: Family Medicine

## 2016-06-12 MED ORDER — NIACIN ER (ANTIHYPERLIPIDEMIC) 1000 MG PO TBCR: 1000.0000 mg | EXTENDED_RELEASE_TABLET | Freq: Every day | ORAL | 1 refills | Status: DC

## 2016-06-12 MED ORDER — NIACIN ER 500 MG PO CPCR: 500.0000 mg | ORAL_CAPSULE | Freq: Every day | ORAL | 0 refills | Status: DC

## 2016-06-14 ENCOUNTER — Encounter: Payer: Self-pay | Admitting: Family Medicine

## 2016-07-26 ENCOUNTER — Other Ambulatory Visit: Payer: BC Managed Care – PPO

## 2016-07-28 ENCOUNTER — Other Ambulatory Visit (INDEPENDENT_AMBULATORY_CARE_PROVIDER_SITE_OTHER): Payer: BC Managed Care – PPO

## 2016-07-28 DIAGNOSIS — R748 Abnormal levels of other serum enzymes: Secondary | ICD-10-CM

## 2016-07-28 DIAGNOSIS — E038 Other specified hypothyroidism: Secondary | ICD-10-CM

## 2016-07-28 MED ORDER — NIACIN ER 500 MG PO CPCR: 1000.0000 mg | ORAL_CAPSULE | Freq: Every day | ORAL | 1 refills | Status: DC

## 2016-07-28 NOTE — Addendum Note (Signed)
Addended by: Fonnie Mu on: 07/28/2016 01:33 PM   Modules accepted: Orders

## 2016-07-29 LAB — VITAMIN B12: Vitamin B-12: 792 pg/mL (ref 200–1100)

## 2016-07-29 LAB — TSH: TSH: 0.55 mIU/L

## 2016-08-09 ENCOUNTER — Ambulatory Visit: Payer: BC Managed Care – PPO | Admitting: Family Medicine

## 2016-08-29 ENCOUNTER — Ambulatory Visit (INDEPENDENT_AMBULATORY_CARE_PROVIDER_SITE_OTHER): Payer: BC Managed Care – PPO | Admitting: Family Medicine

## 2016-08-29 ENCOUNTER — Encounter: Payer: Self-pay | Admitting: Family Medicine

## 2016-08-29 VITALS — BP 123/87 | HR 103 | Ht 63.75 in | Wt 175.0 lb

## 2016-08-29 DIAGNOSIS — R739 Hyperglycemia, unspecified: Secondary | ICD-10-CM

## 2016-08-29 DIAGNOSIS — E559 Vitamin D deficiency, unspecified: Secondary | ICD-10-CM

## 2016-08-29 DIAGNOSIS — E781 Pure hyperglyceridemia: Secondary | ICD-10-CM

## 2016-08-29 DIAGNOSIS — E039 Hypothyroidism, unspecified: Secondary | ICD-10-CM

## 2016-08-29 MED ORDER — NIACIN ER (ANTIHYPERLIPIDEMIC) 1000 MG PO TBCR: 1000.0000 mg | EXTENDED_RELEASE_TABLET | Freq: Every day | ORAL | 1 refills | Status: DC

## 2016-08-29 MED ORDER — LEVOTHYROXINE SODIUM 125 MCG PO TABS: 125.0000 ug | ORAL_TABLET | Freq: Every day | ORAL | 1 refills | Status: DC

## 2016-08-29 NOTE — Patient Instructions (Addendum)
Let's reck FLP and vit D, TSH, free T4    Please realize, EXERCISE IS MEDICINE!  -  American Heart Association [facility]) guidelines for exercise : If you are in good health, without any medical conditions, you should engage in 150 minutes of moderate intensity aerobic activity per week.  This means you should be huffing and puffing throughout your workout.   Engaging in regular exercise will improve brain function and memory, as well as improve mood, boost immune system and help with weight management.  As well as the other, more well-known effects of exercise such as decreasing blood sugar levels, decreasing blood pressure,  and decreasing bad cholesterol levels/ increasing good cholesterol levels.     -  The AHA strongly endorses consumption of a diet that contains a variety of foods from all the food categories with an emphasis on fruits and vegetables; fat-free and low-fat dairy products; cereal and grain products; legumes and nuts; and fish, poultry, and/or extra lean meats.    Excessive food intake, especially of foods high in saturated and trans fats, sugar, and salt, should be avoided.    Adequate water intake of roughly 1/2 of your weight in pounds, should equal the ounces of water per day you should drink.  So for instance, if you're 200 pounds, that would be 100 ounces of water per day.         Mediterranean Diet  Why follow it? Research shows. . Those who follow the Mediterranean diet have a reduced risk of heart disease  . The diet is associated with a reduced incidence of Parkinson's and Alzheimer's diseases . People following the diet may have longer life expectancies and lower rates of chronic diseases  . The Dietary Guidelines for Americans recommends the Mediterranean diet as an eating plan to promote health and prevent disease  What Is the Mediterranean Diet?  . Healthy eating plan based on typical foods and recipes of Mediterranean-style cooking . The diet is primarily a  plant based diet; these foods should make up a majority of meals   Starches - Plant based foods should make up a majority of meals - They are an important sources of vitamins, minerals, energy, antioxidants, and fiber - Choose whole grains, foods high in fiber and minimally processed items  - Typical grain sources include wheat, oats, barley, corn, brown rice, bulgar, farro, millet, polenta, couscous  - Various types of beans include chickpeas, lentils, fava beans, black beans, white beans   Fruits  Veggies - Large quantities of antioxidant rich fruits & veggies; 6 or more servings  - Vegetables can be eaten raw or lightly drizzled with oil and cooked  - Vegetables common to the traditional Mediterranean Diet include: artichokes, arugula, beets, broccoli, brussel sprouts, cabbage, carrots, celery, collard greens, cucumbers, eggplant, kale, leeks, lemons, lettuce, mushrooms, okra, onions, peas, peppers, potatoes, pumpkin, radishes, rutabaga, shallots, spinach, sweet potatoes, turnips, zucchini - Fruits common to the Mediterranean Diet include: apples, apricots, avocados, cherries, clementines, dates, figs, grapefruits, grapes, melons, nectarines, oranges, peaches, pears, pomegranates, strawberries, tangerines  Fats - Replace butter and margarine with healthy oils, such as olive oil, canola oil, and tahini  - Limit nuts to no more than a handful a day  - Nuts include walnuts, almonds, pecans, pistachios, pine nuts  - Limit or avoid candied, honey roasted or heavily salted nuts - Olives are central to the Mediterranean diet - can be eaten whole or used in a variety of dishes   Meats Protein -  Limiting red meat: no more than a few times a month - When eating red meat: choose lean cuts and keep the portion to the size of deck of cards - Eggs: approx. 0 to 4 times a week  - Fish and lean poultry: at least 2 a week  - Healthy protein sources include, chicken, [place], lean beef, lamb - Increase intake  of seafood such as tuna, salmon, trout, mackerel, shrimp, scallops - Avoid or limit high fat processed meats such as sausage and bacon  Dairy - Include moderate amounts of low fat dairy products  - Focus on healthy dairy such as fat free yogurt, skim milk, low or reduced fat cheese - Limit dairy products higher in fat such as whole or 2% milk, cheese, ice cream  Alcohol - Moderate amounts of red wine is ok  - No more than 5 oz daily for women (all ages) and men older than age 43  - No more than 10 oz of wine daily for men younger than 28  Other - Limit sweets and other desserts  - Use herbs and spices instead of salt to flavor foods  - Herbs and spices common to the traditional Mediterranean Diet include: basil, bay leaves, chives, cloves, cumin, fennel, garlic, lavender, marjoram, mint, oregano, parsley, pepper, rosemary, sage, savory, sumac, tarragon, thyme   It's not just a diet, it's a lifestyle:  . The Mediterranean diet includes lifestyle factors typical of those in the region  . Foods, drinks and meals are best eaten with others and savored . Daily physical activity is important for overall good health . This could be strenuous exercise like running and aerobics . This could also be more leisurely activities such as walking, housework, yard-work, or taking the stairs . Moderation is the key; a balanced and healthy diet accommodates most foods and drinks . Consider portion sizes and frequency of consumption of certain foods   Meal Ideas & Options:  . Breakfast:  o Whole wheat toast or whole wheat English muffins with peanut butter & hard boiled egg o Steel cut oats topped with apples & cinnamon and skim milk  o Fresh fruit: banana, strawberries, melon, berries, peaches  o Smoothies: strawberries, bananas, greek yogurt, peanut butter o Low fat greek yogurt with blueberries and granola  o Egg white omelet with spinach and mushrooms o Breakfast couscous: whole wheat couscous,  apricots, skim milk, cranberries  . Sandwiches:  o Hummus and grilled vegetables (peppers, zucchini, squash) on whole wheat bread   o Grilled chicken on whole wheat pita with lettuce, tomatoes, cucumbers or tzatziki  o Tuna salad on whole wheat bread: tuna salad made with greek yogurt, olives, red peppers, capers, green onions o Garlic rosemary lamb pita: lamb sauted with garlic, rosemary, salt & pepper; add lettuce, cucumber, greek yogurt to pita - flavor with lemon juice and black pepper  . Seafood:  o Mediterranean grilled salmon, seasoned with garlic, basil, parsley, lemon juice and black pepper o Shrimp, lemon, and spinach whole-grain pasta salad made with low fat greek yogurt  o Seared scallops with lemon orzo  o Seared tuna steaks seasoned salt, pepper, coriander topped with tomato mixture of olives, tomatoes, olive oil, minced garlic, parsley, green onions and cappers  . Meats:  o Herbed greek chicken salad with kalamata olives, cucumber, feta  o Red bell peppers stuffed with spinach, bulgur, lean ground beef (or lentils) & topped with feta   o Kebabs: skewers of chicken, tomatoes, onions, zucchini, squash  o [place] burgers: made with red onions, mint, dill, lemon juice, feta cheese topped with roasted red peppers . Vegetarian o Cucumber salad: cucumbers, artichoke hearts, celery, red onion, feta cheese, tossed in olive oil & lemon juice  o Hummus and whole grain pita points with a greek salad (lettuce, tomato, feta, olives, cucumbers, red onion) o Lentil soup with celery, carrots made with vegetable broth, garlic, salt and pepper  o Tabouli salad: parsley, bulgur, mint, scallions, cucumbers, tomato, radishes, lemon juice, olive oil, salt and pepper.      For those diagnosed with high triglycerides, it's important to take action to lower your levels and improve your heart health.  Triglyceride is just a fancy word for fat - the fat in our bodies is stored in the form of  triglycerides. Triglycerides are found in foods and manufactured in our bodies.  Normal triglyceride levels are defined as less than 150 mg/dL; 150 to 199 is considered borderline high; 200 to 499 is high; and 500 or higher is officially called very high. To me, anything over 150 is a red flag indicating my patient needs to take immediate steps to get the situation under control.   What is the significance of high triglycerides? High triglyceride levels make blood thicker and stickier, which means that it is more likely to form clots. Studies have shown that triglyceride levels are associated with increased risks of cardiovascular disease and stroke - in both men and women - alone or in combination with other risk factors (high triglycerides combined with high LDL cholesterol can be a particularly deadly combination). For example, in one ground-breaking study, high triglycerides alone increased the risk of cardiovascular disease by 14 percent in men, and by 17 percent in women. But when the test subjects also had low HDL cholesterol (that's the good cholesterol) and other risk factors, high triglycerides increased the risk of disease by 32 percent in men and 76 percent in women.   Fortunately, triglycerides can sometimes be controlled with several diet and lifestyle changes.    What Factors Can Increase Triglycerides? As with cholesterol, eating too much of the wrong kinds of fats will raise your blood triglycerides.  Therefore, it's important to restrict the amounts of saturated fats and trans fats you allow into your diet.  Triglyceride levels can also shoot up after eating foods that are high in carbohydrates or after drinking alcohol.  That's why triglyceride blood tests require an overnight fast.  If you have elevated triglycerides, it's especially important to avoid sugary and refined carbohydrates, including sugar, honey, and other sweeteners, soda and other sugary drinks, candy, baked goods, and  anything made with white (refined or enriched) flour, including white bread, rolls, cereals, buns, pastries, regular pasta, and white rice.  You'll also want to limit dried fruit and fruit juice since they're dense in simple sugar.  All of these low-quality carbs cause a sudden rise in insulin, which may lead to a spike in triglycerides.  Triglycerides can also become elevated as a reaction to having diabetes, hypothyroidism, or kidney disease. As with most other heart-related factors, being overweight and inactive also contribute to abnormal triglycerides. And unfortunately, some people have a genetic predisposition that causes them to manufacture way too much triglycerides on their own, no matter how carefully they eat.   How Can You Lower Your Triglyceride Levels? If you are diagnosed with high triglycerides, it's important to take action. There are several things you can do to help lower your triglyceride levels  and improve your heart health:  Lose weight if you are overweight.  There is a clear correlation between obesity and high triglycerides - the heavier people are, the higher their triglyceride levels are likely to be. The good news is that losing weight can significantly lower triglycerides. In a large study of individuals with type 2 diabetes, those assigned to the "lifestyle intervention group" - which involved counseling, a low-calorie meal plan, and customized exercise program - lost 8.6% of their body weight and lowered their triglyceride levels by more than 16%. If you're overweight, find a weight loss plan that works for you and commit to shedding the pounds and getting healthier.  Reduce the amount of saturated fat and trans fat in your diet.  Start by avoiding or dramatically limiting butter, cream cheese, lard, sour cream, doughnuts, cakes, cookies, candy bars, regular ice cream, fried foods, pizza, cheese sauce, cream-based sauces and salad dressings, high-fat meats (including fatty  hamburgers, bologna, pepperoni, sausage, bacon, salami, pastrami, spareribs, and hot dogs), high-fat cuts of beef and pork, and whole-milk dairy products.   Other ways to cut back: Choose lean meats only (including skinless chicken and [place], lean beef, lean pork), fish, and reduced-fat or fat-free dairy products.   Experiment with adding whole soy foods to your diet. Although soy itself may not reduce risk of heart disease, it replaces hazardous animal fats with healthier proteins. Choose high-quality soy foods, such as tofu, tempeh, soy milk, and edamame (whole soybeans).  Always remove skin from poultry.  Prepare foods by baking, roasting, broiling, boiling, poaching, steaming, grilling, or stir-frying in vegetable oil.  Most stick margarines contain trans fats, and trans fats are also found in some packaged baked goods, potato chips, snack foods, fried foods, and fast food that use or create hydrogenated oils.    (All food labels must now list the amount of trans fats, right after the amount of saturated fats - good news for consumers. As a result, many food companies have now reformulated their products to be trans fat free.many, but not all! So it's still just as important to read labels and make sure the packaged foods you buy don't contain trans fats.)     If you use margarine, purchase soft-tub margarine spreads that contain 0 grams trans fats and don't list any partially hydrogenated oils in the ingredients list. By substituting olive oil or vegetable oil for trans fats in just 2 percent of your daily calories, you can reduce your risk of heart disease by 53 percent.   There is no safe amount of trans fats, so try to keep them as far from your plate as possible.  Avoid foods that are concentrated in sugar (even dried fruit and fruit juice). Sugary foods can elevate triglyceride levels in the blood, so keep them to a bare minimum.  Swap out refined carbohydrates for whole grains.  Refined  carbohydrates - like white rice, regular pasta, and anything made with white or "enriched" flour (including white bread, rolls, cereals, buns, and crackers) - raise blood sugar and insulin levels more than fiber-rich whole grains. Higher insulin levels, in turn, can lead to a higher rise in triglycerides after a meal. So, make the switch to whole wheat bread, whole grain pasta, brown or wild rice, and whole grain versions of cereals, crackers, and other bread products. However, it's important to know that individuals with high triglycerides should moderate even their intake of high-quality starches (since all starches raise blood sugar) - I recommend  1 to 2 servings per meal.  Cut way back on alcohol.  If you have high triglycerides, alcohol should be considered a rare treat - if you indulge at all, since even small amounts of alcohol can dramatically increase triglyceride levels.  Incorporate omega-3 fats.  Heart-healthy fish oils are especially rich in omega-3 fatty acids. In multiple studies over the past two decades, people who ate diets high in omega-3s had 30 to 40 percent reductions in heart disease. Although we don't yet know why fish oil works so well, there are several possibilities. Omega-3s seem to reduce inflammation, reduce high blood pressure, decrease triglycerides, raise HDL cholesterol, and make blood thinner and less sticky so it is less likely to clot. It's as close to a food prescription for heart health as it gets. If you have high triglycerides, I recommend eating at least three servings of one of the omega-3-rich fish every week (fatty fish is the most concentrated food form of omega three fats). If you cannot manage to eat that much fish, speak with your physician about taking fish oil capsules, which offer similar benefits.The best foods for omega-3 fatty acids include wild salmon (fresh, canned), herring, mackerel (not king), sardines, anchovies, rainbow trout, and Pacific oysters.  Non-fish sources of omega-3 fats include omega-3-fortified eggs, ground flaxseed, chia seeds, walnuts, butternuts (white walnuts), seaweed, walnut oil, canola oil, and soybeans.  Quit smoking.  Smoking causes inflammation, not just in your lungs, but throughout your body. Inflammation can contribute to atherosclerosis, blood clots, and risk of heart attack. Smoking makes all heart health indicators worse. If you have high cholesterol, high triglycerides, or high blood pressure, smoking magnifies the danger.  Become more physically active.  Even moderate exercise can help improve cholesterol, triglycerides, and blood pressure. Aerobic exercise seems to be able to stop the sharp rise of triglycerides after eating, perhaps because of a decrease in the amount of triglyceride released by the liver, or because active muscle clears triglycerides out of the blood stream more quickly than inactive muscle. If you haven't exercised regularly (or at all) for years, I recommend starting slowly, by walking at an easy pace for 15 minutes a day. Then, as you feel more comfortable, increase the amount. Your ultimate goal should be at least 30 minutes of moderate physical activity, at least five days a week.

## 2016-08-29 NOTE — Progress Notes (Signed)
Impression and Recommendations:    1. Adult hypothyroidism   2. h/o Hypertriglyceridemia   3. h/o Blood glucose elevated   4. Vitamin D deficiency      Refill of thyroid medicine 125. Recheck mid January, 4 months after he was last checked.  Continue niacin CR 1000 nightly. We will recheck also mid January.  Continue with weight loss as this will help with her elevated blood glucose and other medical issues.  Continue your vitamin D supplement. We will also recheck this in January.  Advised exercise daily at least 30 minutes to help with mood stabilization as well as weight loss.  Handouts provided on prudent diet and exercise.    New Prescriptions   No medications on file    Modified Medications   Modified Medication Previous Medication   LEVOTHYROXINE (SYNTHROID, LEVOTHROID) 125 MCG TABLET levothyroxine (SYNTHROID, LEVOTHROID) 125 MCG tablet      Take 1 tablet (125 mcg total) by mouth daily.    Take 1 tablet by mouth daily.   NIACIN (NIASPAN) 1000 MG CR TABLET niacin (NIASPAN) 1000 MG CR tablet      Take 1 tablet (1,000 mg total) by mouth at bedtime.    Take 1 tablet (1,000 mg total) by mouth at bedtime. 1 tablet at bedtime -Only start after your 500mg  tabs are taken    Discontinued Medications   LEVOTHYROXINE (SYNTHROID, LEVOTHROID) 125 MCG TABLET    Take 1 tablet (125 mcg total) by mouth daily.   LEVOTHYROXINE (SYNTHROID, LEVOTHROID) 137 MCG TABLET    Take 1 tablet by mouth daily.   NIACIN 500 MG CR CAPSULE    Take 2 capsules (1,000 mg total) by mouth at bedtime.    The patient was counseled, risk factors were discussed, anticipatory guidance given.  Gross side effects, risk and benefits, and alternatives of medications and treatment plan in general discussed with patient.  Patient is aware that all medications have potential side effects and we are unable to predict every side effect or drug-drug interaction that may occur.   Patient will call with any  questions prior to using medication if they have concerns.  Expresses verbal understanding and consents to current therapy and treatment regimen.  No barriers to understanding were identified.  Red flag symptoms and signs discussed in detail.  Patient expressed understanding regarding what to do in case of emergency\urgent symptoms  Return for Follow-up for physical in November as scheduled then mid January bldwrk.  Please see AVS handed out to patient at the end of our visit for further patient instructions/ counseling done pertaining to today's office visit.    Note: This document was prepared using Dragon voice recognition software and may include unintentional dictation errors.   --------------------------------------------------------------------------------------------------------------------------------------------------------------------------------------------------------------------------------------------    Subjective:    CC:  Chief Complaint  Patient presents with  . Hypothyroidism    HPI: Ariel King is a 45 y.o. female who presents to [place] at [facility] today for issues as discussed below.  - Since we lowered the dose of her Synthroid from 137- to predict just a few weeks 125 she feels less anxiety now but at first she initially felt more. She feels like her heart rate goes up less now and she does feel less anxious now. No energy problems and mood has been stable.  Hypertriglyceridemia: STARTED ON NIACIN LAST OV AS WELL ---> TOL WELL.  walking more.  eating better.  She has actually lost a few  pounds as well.   Wt Readings from Last 3 Encounters:  08/29/16 175 lb (79.4 kg)  06/09/16 177 lb 6.4 oz (80.5 kg)  05/25/16 180 lb 8 oz (81.9 kg)   BP Readings from Last 3 Encounters:  08/29/16 123/87  06/09/16 94/67  05/25/16 113/74   Pulse Readings from Last 3 Encounters:  08/29/16 (!) 103  06/09/16 90  05/25/16 90   BMI Readings from Last  3 Encounters:  08/29/16 30.27 kg/m  06/09/16 30.69 kg/m  05/25/16 31.23 kg/m     Patient Care Team    Relationship Specialty Notifications Start End  Mellody Dance, DO PCP - General Family Medicine  05/08/16   Kem Boroughs, MD Referring Physician Psychiatry  08/29/16    Comment: migraines/ brain atrophy.  Chucky May, MD Consulting Physician Psychiatry  08/29/16      Patient Active Problem List   Diagnosis Date Noted  . Panic attacks 06/09/2016  . Status post hysterectomy, left ovaries, took cervix. 06/09/2016  . Vitamin D deficiency 06/09/2016  . Adult hypothyroidism 12/19/2015  . Obesity (BMI 30-39.9) 11/03/2013  . Fibromyalgia 12/25/2012  . h/o Blood glucose elevated 12/25/2012  . h/o Hypertriglyceridemia 12/25/2012  . Cannot sleep 12/25/2012  . Anxiety 03/14/2012  . Migraines 03/14/2012  . Depression 03/14/2012  . Chronic fatigue disorder 03/14/2012    Past Medical history, Surgical history, Family history, Social history, Allergies and Medications have been entered into the medical record, reviewed and changed as needed.   Allergies:  Allergies  Allergen Reactions  . Naltrexone Other (See Comments)    Panic attack    Review of Systems  Constitutional: Negative.  Negative for chills, diaphoresis, fever, malaise/fatigue and weight loss.  HENT: Negative.  Negative for congestion, sore throat and tinnitus.   Eyes: Negative.  Negative for blurred vision, double vision and photophobia.  Respiratory: Negative.  Negative for cough and wheezing.   Cardiovascular: Negative.  Negative for chest pain and palpitations.  Gastrointestinal: Negative.  Negative for blood in stool, diarrhea, nausea and vomiting.  Genitourinary: Negative.  Negative for dysuria, frequency and urgency.  Musculoskeletal: Negative.  Negative for joint pain and myalgias.  Skin: Negative.  Negative for itching and rash.  Neurological: Negative.  Negative for dizziness, focal weakness,  weakness and headaches.  Endo/Heme/Allergies: Negative.  Negative for environmental allergies and polydipsia. Does not bruise/bleed easily.  Psychiatric/Behavioral: Negative.  Negative for depression and memory loss. The patient is not nervous/anxious and does not have insomnia.      Objective:   Blood pressure 123/87, pulse (!) 103, height 5' 3.75" (1.619 m), weight 175 lb (79.4 kg). Body mass index is 30.27 kg/m. General: Well Developed, well nourished, appropriate for stated age.  Neuro: Alert and oriented x3, extra-ocular muscles intact, sensation grossly intact.  HEENT: Normocephalic, atraumatic, neck supple   Skin: Warm and dry, no gross rash. Cardiac: RRR, S1 S2,  no murmurs rubs or gallops.  Respiratory: ECTA B/L, Not using accessory muscles, speaking in full sentences-unlabored. Vascular:  No gross lower ext edema, cap RF less 2 sec. Psych: No HI/SI, judgement and insight good, Euthymic mood. Full Affect.

## 2016-08-30 ENCOUNTER — Encounter: Payer: Self-pay | Admitting: Family Medicine

## 2016-08-30 ENCOUNTER — Ambulatory Visit (INDEPENDENT_AMBULATORY_CARE_PROVIDER_SITE_OTHER): Payer: Medicare Other | Admitting: Family Medicine

## 2016-08-30 VITALS — BP 132/72 | HR 91 | Temp 98.7°F | Ht 63.75 in | Wt 175.0 lb

## 2016-08-30 DIAGNOSIS — R109 Unspecified abdominal pain: Secondary | ICD-10-CM

## 2016-08-30 DIAGNOSIS — R1030 Lower abdominal pain, unspecified: Secondary | ICD-10-CM

## 2016-08-30 DIAGNOSIS — N3001 Acute cystitis with hematuria: Secondary | ICD-10-CM

## 2016-08-30 LAB — POCT URINALYSIS DIPSTICK
Bilirubin, UA: NEGATIVE
Glucose, UA: NEGATIVE
Ketones, UA: NEGATIVE
Nitrite, UA: NEGATIVE
Protein, UA: 30
Spec Grav, UA: 1.015
Urobilinogen, UA: 0.2
pH, UA: 7.5

## 2016-08-30 MED ORDER — NITROFURANTOIN MONOHYD MACRO 100 MG PO CAPS: 100.0000 mg | ORAL_CAPSULE | Freq: Two times a day (BID) | ORAL | 0 refills | Status: DC

## 2016-08-30 MED ORDER — HYDROCODONE-ACETAMINOPHEN 7.5-325 MG PO TABS: ORAL_TABLET | ORAL | 0 refills | Status: DC

## 2016-08-30 MED ORDER — PHENAZOPYRIDINE HCL 200 MG PO TABS: 200.0000 mg | ORAL_TABLET | Freq: Three times a day (TID) | ORAL | 0 refills | Status: AC

## 2016-08-30 NOTE — Progress Notes (Signed)
Subjective:    HPI: Ariel King is a 45 y.o. female who presents to [place] at [facility] today for c/o dysuria.  Sx for <24hrs.  Woke up this am with pain and pressure, felt like shooting up into her insides. A lot suprapubic pressure.   + freq and urgency but dec amount/ can't get drops out.  When she gets UTI's- usually just odor and sometimes blood. Last UTI- several yrs.   Took 800mg  ibuprofen this am for the pain- didn't touch it.   WOuld like something stronger  C/O: + dysuria + increased frequency + increased urgency - malodorous urination - Nausea - Fever/ + Chills + R Back Pain + one time yellowish vaginal discharge yesterday only- none today NO prior h/o STI Monogamous currently; partner w/o STI- currently one for past month NO ABX usage past 30 d   Urinalysis    Component Value Date/Time   BILIRUBINUR negative 08/30/2016 1427   PROTEINUR 30 08/30/2016 1427   UROBILINOGEN 0.2 08/30/2016 1427   NITRITE negative 08/30/2016 1427   LEUKOCYTESUR small (1+) (A) 08/30/2016 1427    Wt Readings from Last 3 Encounters:  08/30/16 175 lb (79.4 kg)  08/29/16 175 lb (79.4 kg)  06/09/16 177 lb 6.4 oz (80.5 kg)   BP Readings from Last 3 Encounters:  08/30/16 132/72  08/29/16 123/87  06/09/16 94/67   Pulse Readings from Last 3 Encounters:  08/30/16 91  08/29/16 (!) 103  06/09/16 90   BMI Readings from Last 3 Encounters:  08/30/16 30.27 kg/m  08/29/16 30.27 kg/m  06/09/16 30.69 kg/m     Patient Active Problem List   Diagnosis Date Noted  . Panic attacks 06/09/2016  . Status post hysterectomy, left ovaries, took cervix. 06/09/2016  . Vitamin D deficiency 06/09/2016  . Adult hypothyroidism 12/19/2015  . Obesity (BMI 30-39.9) 11/03/2013  . Fibromyalgia 12/25/2012  . h/o Blood glucose elevated 12/25/2012  . h/o Hypertriglyceridemia 12/25/2012  . Cannot sleep 12/25/2012  . Anxiety 03/14/2012  . Migraines 03/14/2012  . Depression  03/14/2012  . Chronic fatigue disorder 03/14/2012    Past Surgical History:  Procedure Laterality Date  . ROBOTIC ASSISTED LAP VAGINAL HYSTERECTOMY    . SYNOVECTOMY WRIST      Family History  Problem Relation Age of Onset  . Diabetes Mother   . Migraines Mother   . Heart attack Mother   . Depression Mother   . Hypertension Mother   . Alcohol abuse Father   . Cancer Father     prostate  . Depression Sister     History  Drug Use No  ,  History  Alcohol Use No  ,  History  Smoking Status  . Former Smoker  . Types: Cigarettes  Smokeless Tobacco  . Never Used    Comment: quit 10 years ago  ,  History  Sexual Activity  . Sexual activity: Yes  . Partners: Male  . Birth control/ protection: Other-see comments    Comment: hyst    Patient's Medications  New Prescriptions   HYDROCODONE-ACETAMINOPHEN (NORCO) 7.5-325 MG TABLET    1 tablet every 6-8 hours when necessary severe pain   NITROFURANTOIN, MACROCRYSTAL-MONOHYDRATE, (MACROBID) 100 MG CAPSULE    Take 1 capsule (100 mg total) by mouth 2 (two) times daily.  Previous Medications   ALPRAZOLAM (XANAX) 0.5 MG TABLET    Take 1-2 tablets by mouth every 6 (six) hours as needed.   AMPHETAMINE-DEXTROAMPHETAMINE (ADDERALL) 20  MG TABLET    Take 1 tablet by mouth 3 (three) times daily.   B COMPLEX VITAMINS (VITAMIN-B COMPLEX) TABS    Take 1 tablet by mouth daily.   LEVOTHYROXINE (SYNTHROID, LEVOTHROID) 125 MCG TABLET    Take 1 tablet (125 mcg total) by mouth daily.   NIACIN (NIASPAN) 1000 MG CR TABLET    Take 1 tablet (1,000 mg total) by mouth at bedtime.   QUETIAPINE (SEROQUEL) 100 MG TABLET    Take 100 mg by mouth as needed.   TRAZODONE (DESYREL) 100 MG TABLET    Take 2 tablets by mouth at bedtime.   VENLAFAXINE XR (EFFEXOR-XR) 75 MG 24 HR CAPSULE    Take 3 capsules by mouth every morning.   VITAMIN D, ERGOCALCIFEROL, (DRISDOL) 50000 UNITS CAPS CAPSULE    Take 1 capsule (50,000 Units total) by mouth every 7 (seven) days.    Modified Medications   No medications on file  Discontinued Medications   No medications on file    Naltrexone  Current Meds  Medication Sig  . ALPRAZolam (XANAX) 0.5 MG tablet Take 1-2 tablets by mouth every 6 (six) hours as needed.  Marland Kitchen amphetamine-dextroamphetamine (ADDERALL) 20 MG tablet Take 1 tablet by mouth 3 (three) times daily.  . B Complex Vitamins (VITAMIN-B COMPLEX) TABS Take 1 tablet by mouth daily.  Marland Kitchen levothyroxine (SYNTHROID, LEVOTHROID) 125 MCG tablet Take 1 tablet (125 mcg total) by mouth daily.  . niacin (NIASPAN) 1000 MG CR tablet Take 1 tablet (1,000 mg total) by mouth at bedtime.  Marland Kitchen QUEtiapine (SEROQUEL) 100 MG tablet Take 100 mg by mouth as needed.  . traZODone (DESYREL) 100 MG tablet Take 2 tablets by mouth at bedtime.  Marland Kitchen venlafaxine XR (EFFEXOR-XR) 75 MG 24 hr capsule Take 3 capsules by mouth every morning.  . Vitamin D, Ergocalciferol, (DRISDOL) 50000 units CAPS capsule Take 1 capsule (50,000 Units total) by mouth every 7 (seven) days.   Review of Systems  Constitutional: Positive for chills. Negative for diaphoresis, fever, malaise/fatigue and weight loss.  HENT: Negative.  Negative for congestion, sore throat and tinnitus.   Eyes: Negative.  Negative for blurred vision, double vision and photophobia.  Respiratory: Negative.  Negative for cough and wheezing.   Cardiovascular: Negative.  Negative for chest pain and palpitations.  Gastrointestinal: Negative.  Negative for blood in stool, diarrhea, nausea and vomiting.  Genitourinary: Positive for dysuria, flank pain, frequency and urgency.       Hesitancy  Musculoskeletal: Negative for joint pain and myalgias.  Skin: Negative.  Negative for itching and rash.  Neurological: Negative.  Negative for dizziness, focal weakness, weakness and headaches.  Endo/Heme/Allergies: Negative.  Negative for environmental allergies and polydipsia. Does not bruise/bleed easily.  Psychiatric/Behavioral: Negative.  Negative for  depression and memory loss. The patient is not nervous/anxious and does not have insomnia.     Objective:  Blood pressure 132/72, pulse 91, temperature 98.7 F (37.1 C), temperature source Oral, height 5' 3.75" (1.619 m), weight 175 lb (79.4 kg). Body mass index is 30.27 kg/m.  General: Well Developed, well nourished, and in no acute distress.  HEENT: Normocephalic, atraumatic Skin: Warm and dry, cap RF less 2 sec, good turgor CV: +S1, S2 Respiratory: ECTA B/L; speaking in full sentences, no conversational dyspnea Abd: Soft, NT, ND, No G/R/R, no SPT, mild flank pain- b/l NeuroM-Sk: Ambulates w/o assistance, moves * 4 Psych: A and O *3    Impression and Recommendations:    1. Acute cystitis with hematuria  2. Flank pain   3. Lower abdominal pain- suprapubic     1. Macrobid, Pyridium, Vicodin when necessary severe pain 2. Supportive care discussed with extra fluids, rest and patient will let us know if it gets worse not better. 3. We will send urine for culture and in 5 or so days we should know what bacteria has grown out and will let patient know if need to change antibiotics or if nothing grew  Orders Placed This Encounter  Procedures  . CULTURE, URINE COMPREHENSIVE  . POCT urinalysis dipstick   The patient was counseled, risk factors were discussed, anticipatory guidance given.  Gross side effects, risk and benefits, and alternatives of medications discussed with patient.  Patient is aware that all medications have potential side effects and we are unable to predict every side effect or drug-drug interaction that may occur.  Expresses verbal understanding and consents to current therapy plan and treatment regimen.   Orders Placed This Encounter  Procedures  . CULTURE, URINE COMPREHENSIVE  . POCT urinalysis dipstick    Meds ordered this encounter  Medications  . phenazopyridine (PYRIDIUM) 200 MG tablet    Sig: Take 1 tablet (200 mg total) by mouth 3 (three) times  daily.    Dispense:  6 tablet    Refill:  0  . nitrofurantoin, macrocrystal-monohydrate, (MACROBID) 100 MG capsule    Sig: Take 1 capsule (100 mg total) by mouth 2 (two) times daily.    Dispense:  14 capsule    Refill:  0  . HYDROcodone-acetaminophen (NORCO) 7.5-325 MG tablet    Sig: 1 tablet every 6-8 hours when necessary severe pain    Dispense:  20 tablet    Refill:  0    New Prescriptions   HYDROCODONE-ACETAMINOPHEN (NORCO) 7.5-325 MG TABLET    1 tablet every 6-8 hours when necessary severe pain   NITROFURANTOIN, MACROCRYSTAL-MONOHYDRATE, (MACROBID) 100 MG CAPSULE    Take 1 capsule (100 mg total) by mouth 2 (two) times daily.    Modified Medications   No medications on file    Discontinued Medications   No medications on file    Return for 3-4 wks for reck Urine-- ensure no more blood.

## 2016-08-30 NOTE — Patient Instructions (Signed)
Please come in 2 weeks after your symptom-free to check to make sure the blood in her urine has resolved.    Interstitial Cystitis Interstitial cystitis is a condition that causes inflammation of the bladder. The bladder is a hollow organ in the lower part of your abdomen. It stores urine after the urine is made by your kidneys. With interstitial cystitis, you may have pain in the bladder area. You may also have a frequent and urgent need to urinate. The severity of interstitial cystitis can vary from person to person. You may have flare-ups of the condition, and then it may go away for a while. For many people who have this condition, it becomes a long-term problem. CAUSES The cause of this condition is not known. RISK FACTORS This condition is more likely to develop in women. SYMPTOMS Symptoms of interstitial cystitis vary, and they can change over time. Symptoms may include:  Discomfort or pain in the bladder area. This can range from mild to severe. The pain may change in intensity as the bladder fills with urine or as it empties.  Pelvic pain.  An urgent need to urinate.  Frequent urination.  Pain during sexual intercourse.  Pinpoint bleeding on the bladder wall. For women, the symptoms often get worse during menstruation. DIAGNOSIS This condition is diagnosed by evaluating your symptoms and ruling out other causes. A physical exam will be done. Various tests may be done to rule out other conditions. Common tests include:  Urine tests.  Cystoscopy. In this test, a tool that is like a very thin telescope is used to look into your bladder.  Biopsy. This involves taking a sample of tissue from the bladder wall to be examined under a microscope. TREATMENT There is no cure for interstitial cystitis, but treatment methods are available to control your symptoms. Work closely with your health care provider to find the treatments that will be most effective for you. Treatment options  may include:  Medicines to relieve pain and to help reduce the number of times that you feel the need to urinate.  Bladder training. This involves learning ways to control when you urinate, such as:  Urinating at scheduled times.  Training yourself to delay urination.  Doing exercises (Kegel exercises) to strengthen the muscles that control urine flow.  Lifestyle changes, such as changing your diet or taking steps to control stress.  Use of a device that provides electrical stimulation in order to reduce pain.  A procedure that stretches your bladder by filling it with air or fluid.  Surgery. This is rare. It is only done for extreme cases if other treatments do not help. HOME CARE INSTRUCTIONS  Take medicines only as directed by your health care provider.  Use bladder training techniques as directed.  Keep a bladder diary to find out which foods, liquids, or activities make your symptoms worse.  Use your bladder diary to schedule bathroom trips. If you are away from home, plan to be near a bathroom at each of your scheduled times.  Make sure you urinate just before you leave the house and just before you go to bed.  Do Kegel exercises as directed by your health care provider.  Do not drink alcohol.  Do not use any tobacco products, including cigarettes, chewing tobacco, or electronic cigarettes. If you need help quitting, ask your health care provider.  Make dietary changes as directed by your health care provider. You may need to avoid spicy foods and foods that contain a  high amount of potassium.  Limit your drinking of beverages that stimulate urination. These include soda, coffee, and tea.  Keep all follow-up visits as directed by your health care provider. This is important. SEEK MEDICAL CARE IF:  Your symptoms do not get better after treatment.  Your pain and discomfort are getting worse.  You have more frequent urges to urinate.  You have a fever. SEEK  IMMEDIATE MEDICAL CARE IF:  You are not able to control your bladder at all.   This information is not intended to replace advice given to you by your health care provider. Make sure you discuss any questions you have with your health care provider.   Document Released: 06/30/2004 Document Revised: 11/20/2014 Document Reviewed: 07/07/2014 Elsevier Interactive Patient Education 2016 [employer]      Urinary Tract Infection Urinary tract infections (UTIs) can develop anywhere along your urinary tract. Your urinary tract is your body's drainage system for removing wastes and extra water. Your urinary tract includes two kidneys, two ureters, a bladder, and a urethra. Your kidneys are a pair of bean-shaped organs. Each kidney is about the size of your fist. They are located below your ribs, one on each side of your spine. CAUSES Infections are caused by microbes, which are microscopic organisms, including fungi, viruses, and bacteria. These organisms are so small that they can only be seen through a microscope. Bacteria are the microbes that most commonly cause UTIs. SYMPTOMS  Symptoms of UTIs may vary by age and gender of the patient and by the location of the infection. Symptoms in young women typically include a frequent and intense urge to urinate and a painful, burning feeling in the bladder or urethra during urination. Older women and men are more likely to be tired, shaky, and weak and have muscle aches and abdominal pain. A fever may mean the infection is in your kidneys. Other symptoms of a kidney infection include pain in your back or sides below the ribs, nausea, and vomiting. DIAGNOSIS To diagnose a UTI, your caregiver will ask you about your symptoms. Your caregiver will also ask you to provide a urine sample. The urine sample will be tested for bacteria and white blood cells. White blood cells are made by your body to help fight infection. TREATMENT  Typically, UTIs can be treated  with medication. Because most UTIs are caused by a bacterial infection, they usually can be treated with the use of antibiotics. The choice of antibiotic and length of treatment depend on your symptoms and the type of bacteria causing your infection. HOME CARE INSTRUCTIONS  If you were prescribed antibiotics, take them exactly as your caregiver instructs you. Finish the medication even if you feel better after you have only taken some of the medication.  Drink enough water and fluids to keep your urine clear or pale yellow.  Avoid caffeine, tea, and carbonated beverages. They tend to irritate your bladder.  Empty your bladder often. Avoid holding urine for long periods of time.  Empty your bladder before and after sexual intercourse.  After a bowel movement, women should cleanse from front to back. Use each tissue only once. SEEK MEDICAL CARE IF:   You have back pain.  You develop a fever.  Your symptoms do not begin to resolve within 3 days. SEEK IMMEDIATE MEDICAL CARE IF:   You have severe back pain or lower abdominal pain.  You develop chills.  You have nausea or vomiting.  You have continued burning or discomfort with  urination. MAKE SURE YOU:   Understand these instructions.  Will watch your condition.  Will get help right away if you are not doing well or get worse.   This information is not intended to replace advice given to you by your health care provider. Make sure you discuss any questions you have with your health care provider.   Document Released: 08/09/2005 Document Revised: 07/21/2015 Document Reviewed: 12/08/2011 Elsevier Interactive Patient Education [employer].

## 2016-09-02 LAB — CULTURE, URINE COMPREHENSIVE

## 2016-09-11 ENCOUNTER — Telehealth: Payer: Self-pay | Admitting: Family Medicine

## 2016-09-11 NOTE — Telephone Encounter (Signed)
wants to discuss her urine results

## 2016-09-18 NOTE — Telephone Encounter (Signed)
Have left 3 messages for pt to return call, which she has not.  Therefore, encounter closed per protocol.  Charyl Bigger, CMA

## 2016-09-27 ENCOUNTER — Encounter: Payer: BC Managed Care – PPO | Admitting: Family Medicine

## 2016-10-25 ENCOUNTER — Ambulatory Visit (INDEPENDENT_AMBULATORY_CARE_PROVIDER_SITE_OTHER): Payer: BC Managed Care – PPO | Admitting: Family Medicine

## 2016-10-25 ENCOUNTER — Encounter: Payer: Self-pay | Admitting: Family Medicine

## 2016-10-25 VITALS — BP 108/72 | HR 109 | Ht 63.75 in | Wt 170.8 lb

## 2016-10-25 DIAGNOSIS — Z113 Encounter for screening for infections with a predominantly sexual mode of transmission: Secondary | ICD-10-CM

## 2016-10-25 DIAGNOSIS — Z124 Encounter for screening for malignant neoplasm of cervix: Secondary | ICD-10-CM

## 2016-10-25 DIAGNOSIS — R059 Cough, unspecified: Secondary | ICD-10-CM

## 2016-10-25 DIAGNOSIS — R05 Cough: Secondary | ICD-10-CM

## 2016-10-25 DIAGNOSIS — J069 Acute upper respiratory infection, unspecified: Secondary | ICD-10-CM

## 2016-10-25 DIAGNOSIS — Z1239 Encounter for other screening for malignant neoplasm of breast: Secondary | ICD-10-CM

## 2016-10-25 DIAGNOSIS — Z1211 Encounter for screening for malignant neoplasm of colon: Secondary | ICD-10-CM

## 2016-10-25 DIAGNOSIS — Z0000 Encounter for general adult medical examination without abnormal findings: Secondary | ICD-10-CM

## 2016-10-25 LAB — POC HEMOCCULT BLD/STL (OFFICE/1-CARD/DIAGNOSTIC): Fecal Occult Blood, POC: NEGATIVE

## 2016-10-25 MED ORDER — HYDROCOD POLST-CPM POLST ER 10-8 MG/5ML PO SUER: 5.0000 mL | Freq: Two times a day (BID) | ORAL | 0 refills | Status: DC | PRN

## 2016-10-25 NOTE — Progress Notes (Signed)
Impression and Recommendations:    1. Encounter for wellness examination   2. Screening for cervical cancer   3. Screening for STD (sexually transmitted disease)   4. Screening for breast cancer   5. Cough   6. Acute upper respiratory infection   7. Screening for colon cancer    -Asked Ariel King CMA to see if we can locate [name], her past GYN's surgical report regarding her hysterectomy as I feel they did most likely take her cervix- not palpable on exam today.  - STD testing today, extensive counseling done, condoms advised each time.  - For URI symptoms for approximately 2 weeks now, Tussionex, Mucinex DM and/or DayQuil and NyQuil around-the-clock. If fever chills and worsening-she will let us know. worked well in the past. Prescription written.  Please see orders section below for further details of actions taken during this office visit.  Gross side effects, risk and benefits, and alternatives of medications discussed with patient.  Patient is aware that all medications have potential side effects and we are unable to predict every side effect or drug-drug interaction that may occur.  Expresses verbal understanding and consents to current therapy plan and treatment regiment.  1) Anticipatory Guidance: Discussed importance of wearing a seatbelt while driving, not texting while driving; sunscreen when outside along with yearly skin surveillance; eating a well balanced and modest diet; physical activity at least 25 minutes per day or 150 min/ week of moderate to intense activity.  2) Immunizations / Screenings / Labs:  All immunizations and screenings that patient agrees to, are up-to-date per recommendations or will be updated today.  Patient understands the needs for q 22mo dental and yearly vision screens which pt will schedule independently. Obtain CBC, CMP, HgA1c, Lipid panel, TSH and vit D when fasting if not already done recently.   3) Weight:   Discussed goal of  losing even 5-10% of current body weight which would improve overall feelings of well being and improve objective health data significantly.   Improve nutrient density of diet through increasing intake of fruits and vegetables and decreasing saturated/trans fats, white flour products and refined sugar products.   F-up preventative CPE in 1 year. F/up sooner for chronic care management as discussed and/or prn.   Orders Placed This Encounter  Procedures  . GC/Chlamydia Probe Amp  . MM Digital Screening  . HIV antibody  . Hepatitis B Surface AntiGEN  . Hepatitis C antibody  . HSV(herpes simplex vrs) 1+2 ab-IgG  . RPR  . POC Hemoccult Bld/Stl (1-Cd Office Dx)     Please see orders placed and AVS handed out to patient at the end of our visit for further patient instructions/ counseling done pertaining to today's office visit.     Subjective:    Chief Complaint  Patient presents with  . Annual Exam   CC:  CPE  HPI: Ariel King is a 45 y.o. female who presents to [facility] today a yearly health maintenance exam.  Health Maintenance Summary Reviewed and updated, unless pt declines services.  About 3 yrs ago was last PAP-.  Age 59--cone bx normal since.   S/p hysterectomy  Husband has run around on her recently and pt worried about std and desires screeening--> no sx.   No known diseases.  Pt has been with two partners past 6-31months or so, not always using protection.    Mammo-- never had one      Colonoscopy:     (  Unnecessary secondary to < 68 or > 13 years old.) Tdap: Up to date less than 10 yrs per pt Alcohol:    No concerns, no excessive use Exercise Habits:   Not meeting AHA guidelines STD concerns:   +  Drug Use:   None Birth control method:   n/a Menses regular:     n/a Lumps or breast concerns:      no Breast Cancer Family History:      No [email or web address]   Wt Readings from Last 3 Encounters:  10/25/16  170 lb 12.8 oz (77.5 kg)  08/30/16 175 lb (79.4 kg)  08/29/16 175 lb (79.4 kg)   BP Readings from Last 3 Encounters:  10/25/16 108/72  08/30/16 132/72  08/29/16 123/87   Pulse Readings from Last 3 Encounters:  10/25/16 (!) 109  08/30/16 91  08/29/16 (!) 103     Past Medical History:  Diagnosis Date  . Abnormal Pap smear   . Adult hypothyroidism 12/19/2015  . Anemia   . Anxiety   . Bladder disorder   . Chronic fatigue and immune dysfunction syndrome ([place])   . Chronic kidney disease   . Depression   . Depression   . EBV infection   . H/O bladder infections   . H/O joint problems   . Hx of migraines   . Incontinence   . Thyroid disease       Past Surgical History:  Procedure Laterality Date  . ROBOTIC ASSISTED LAP VAGINAL HYSTERECTOMY    . SYNOVECTOMY WRIST        Family History  Problem Relation Age of Onset  . Diabetes Mother   . Migraines Mother   . Heart attack Mother   . Depression Mother   . Hypertension Mother   . Alcohol abuse Father   . Cancer Father     prostate  . Depression Sister       History  Drug Use No  ,   History  Alcohol Use No  ,   History  Smoking Status  . Former Smoker  . Types: Cigarettes  Smokeless Tobacco  . Never Used    Comment: quit 10 years ago  ,   History  Sexual Activity  . Sexual activity: Yes  . Partners: Male  . Birth control/ protection: Other-see comments    Comment: hyst    Current Outpatient Prescriptions on File Prior to Visit  Medication Sig Dispense Refill  . ALPRAZolam (XANAX) 0.5 MG tablet Take 1-2 tablets by mouth every 6 (six) hours as needed.    Marland Kitchen amphetamine-dextroamphetamine (ADDERALL) 20 MG tablet Take 1 tablet by mouth 3 (three) times daily.    . B Complex Vitamins (VITAMIN-B COMPLEX) TABS Take 1 tablet by mouth daily.    Marland Kitchen levothyroxine (SYNTHROID, LEVOTHROID) 125 MCG tablet Take 1 tablet (125 mcg total) by mouth daily. 90 tablet 1  . niacin (NIASPAN) 1000 MG CR tablet Take 1  tablet (1,000 mg total) by mouth at bedtime. 90 tablet 1  . QUEtiapine (SEROQUEL) 100 MG tablet Take 100 mg by mouth as needed.    . traZODone (DESYREL) 100 MG tablet Take 2 tablets by mouth at bedtime.    Marland Kitchen venlafaxine XR (EFFEXOR-XR) 75 MG 24 hr capsule Take 3 capsules by mouth every morning.    . Vitamin D, Ergocalciferol, (DRISDOL) 50000 units CAPS capsule Take 1 capsule (50,000 Units total) by mouth every 7 (seven) days. 30 capsule 1   No current facility-administered medications on file prior to  visit.     Allergies: Naltrexone  Review of Systems  Constitutional: Negative for chills and fever.       Night sweats  HENT: Negative for congestion and sinus pain.   Eyes: Negative for blurred vision and double vision.  Respiratory: Negative for shortness of breath and wheezing.   Cardiovascular: Negative for chest pain, palpitations and orthopnea.  Gastrointestinal: Positive for nausea. Negative for constipation, diarrhea, heartburn and vomiting.  Genitourinary: Negative for frequency and hematuria.  Musculoskeletal: Negative for falls and joint pain.       Chronic jt pains  Skin: Negative for rash.  Neurological: Negative for dizziness, sensory change and focal weakness.  Endo/Heme/Allergies: Negative for polydipsia.  Psychiatric/Behavioral: Negative for depression and suicidal ideas. The patient is not nervous/anxious and does not have insomnia.      Objective:    Blood pressure 108/72, pulse (!) 109, height 5' 3.75" (1.619 m), weight 170 lb 12.8 oz (77.5 kg). Body mass index is 29.55 kg/m. General Appearance:    Alert, cooperative, no distress, appears stated age  Head:    Normocephalic, without obvious abnormality, atraumatic  Eyes:    PERRL, conjunctiva/corneas clear, EOM's intact, fundi    benign, both eyes  Ears:    Normal TM's and external ear canals, both ears  Nose:   Nares normal, septum midline, mucosa normal, no drainage    or sinus tenderness  Throat:   Lips w/o  lesion, mucosa moist, and tongue normal; teeth and   gums normal  Neck:   Supple, symmetrical, trachea midline, no adenopathy;    thyroid:  no enlargement/tenderness/nodules; no carotid   bruit or JVD  Back:     Symmetric, no curvature, ROM normal, no CVA tenderness  Lungs:     Clear to auscultation bilaterally, respirations unlabored, no       Wh/ R/ R  Chest Wall:    No tenderness or gross deformity; normal excursion   Heart:    Regular rate and rhythm, S1 and S2 normal, no murmur, rub   or gallop  Breast Exam:    No tenderness, masses, or nipple abnormality b/l; no d/cBilaterally. Bobetta Lime, CMA in room.  Abdomen:     Soft, non-tender, bowel sounds active all four quadrants, NO   G/R/R, no masses, no organomegaly  Genitalia:    Ext genitalia: without lesion, no rash or discharge, No         tenderness;  Cervix: Was not palpable. Vagina without discharge or lesion;        Adnexa:  No tenderness or palpable masses   Rectal:    Normal tone, no masses or tenderness;   guaiac negative stool  Extremities:   Extremities normal, atraumatic, no cyanosis or gross edema  Pulses:   2+ and symmetric all extremities  Skin:   Warm, dry, Skin color, texture, turgor normal, no obvious rashes or lesions Psych: No HI/SI, judgement and insight good, Euthymic mood. Full Affect.  Neurologic:   CNII-XII intact, normal strength, sensation and reflexes    Throughout   DEXA:    [email or web address]   Based on the [place] FRAX tool, a 46 year old white woman with no other risk factors has a 9.3% 10-year risk for any osteoporotic fracture. White women between the ages of 39 and 14 years with equivalent or greater 10-year fracture risks based on specific risk factors include but are not limited to the following persons: 2) a 46 year old current smoker with a BMI less than 21 kg/m2, daily alcohol  use, and parental fracture history; 40) a 46 year old woman with a parental fracture history; 79) a 46 year old  woman with a BMI less than 21 kg/m2 and daily alcohol use; and 45) a 46 year old current smoker with daily alcohol use.   The FRAX tool also predicts 10-year fracture risks for black, Asian, and Hispanic women in the [place]. In general, estimated fracture risks in nonwhite women are lower than those for white women of the same age. Although the USPSTF recommends using a 9.3% 10-year fracture risk threshold to screen women aged 59 to 57 years,

## 2016-10-25 NOTE — Patient Instructions (Signed)
Preventive Care for Adults, Female  A healthy lifestyle and preventive care can promote health and wellness. Preventive health guidelines for women include the following key practices.   A routine yearly physical is a good way to check with your health care provider about your health and preventive screening. It is a chance to share any concerns and updates on your health and to receive a thorough exam.   Visit your dentist for a routine exam and preventive care every 6 months. Brush your teeth twice a day and floss once a day. Good oral hygiene prevents tooth decay and gum disease.   The frequency of eye exams is based on your age, health, family medical history, use of contact lenses, and other factors. Follow your health care provider's recommendations for frequency of eye exams.   Eat a healthy diet. Foods like vegetables, fruits, whole grains, low-fat dairy products, and lean protein foods contain the nutrients you need without too many calories. Decrease your intake of foods high in solid fats, added sugars, and salt. Eat the right amount of calories for you.Get information about a proper diet from your health care provider, if necessary.   Regular physical exercise is one of the most important things you can do for your health. Most adults should get at least 150 minutes of moderate-intensity exercise (any activity that increases your heart rate and causes you to sweat) each week. In addition, most adults need muscle-strengthening exercises on 2 or more days a week.   Maintain a healthy weight. The body mass index (BMI) is a screening tool to identify possible weight problems. It provides an estimate of body fat based on height and weight. Your health care provider can find your BMI, and can help you achieve or maintain a healthy weight.For adults 20 years and older:   - A BMI below 18.5 is considered underweight.   - A BMI of 18.5 to 24.9 is normal.   - A BMI of 25 to 29.9 is  considered overweight.   - A BMI of 30 and above is considered obese.   Maintain normal blood lipids and cholesterol levels by exercising and minimizing your intake of trans and saturated fats.  Eat a balanced diet with plenty of fruit and vegetables. Blood tests for lipids and cholesterol should begin at age 20 and be repeated every 5 years minimum.  If your lipid or cholesterol levels are high, you are over 40, or you are at high risk for heart disease, you may need your cholesterol levels checked more frequently.Ongoing high lipid and cholesterol levels should be treated with medicines if diet and exercise are not working.   If you smoke, find out from your health care provider how to quit. If you do not use tobacco, do not start.   Lung cancer screening is recommended for adults aged 55-80 years who are at high risk for developing lung cancer because of a history of smoking. A yearly low-dose CT scan of the lungs is recommended for people who have at least a 30-pack-year history of smoking and are a current smoker or have quit within the past 15 years. A pack year of smoking is smoking an average of 1 pack of cigarettes a day for 1 year (for example: 1 pack a day for 30 years or 2 packs a day for 15 years). Yearly screening should continue until the smoker has stopped smoking for at least 15 years. Yearly screening should be stopped for people who develop a   health problem that would prevent them from having lung cancer treatment.   If you are pregnant, do not drink alcohol. If you are breastfeeding, be very cautious about drinking alcohol. If you are not pregnant and choose to drink alcohol, do not have more than 1 drink per day. One drink is considered to be 12 ounces (355 mL) of beer, 5 ounces (148 mL) of wine, or 1.5 ounces (44 mL) of liquor.   Avoid use of street drugs. Do not share needles with anyone. Ask for help if you need support or instructions about stopping the use of  drugs.   High blood pressure causes heart disease and increases the risk of stroke. Your blood pressure should be checked at least yearly.  Ongoing high blood pressure should be treated with medicines if weight loss and exercise do not work.   If you are 69-55 years old, ask your health care provider if you should take aspirin to prevent strokes.   Diabetes screening involves taking a blood sample to check your fasting blood sugar level. This should be done once every 3 years, after age 38, if you are within normal weight and without risk factors for diabetes. Testing should be considered at a younger age or be carried out more frequently if you are overweight and have at least 1 risk factor for diabetes.   Breast cancer screening is essential preventive care for women. You should practice "breast self-awareness."  This means understanding the normal appearance and feel of your breasts and may include breast self-examination.  Any changes detected, no matter how small, should be reported to a health care provider.  Women in their 80s and 30s should have a clinical breast exam (CBE) by a health care provider as part of a regular health exam every 1 to 3 years.  After age 66, women should have a CBE every year.  Starting at age 1, women should consider having a mammogram (breast X-ray test) every year.  Women who have a family history of breast cancer should talk to their health care provider about genetic screening.  Women at a high risk of breast cancer should talk to their health care providers about having an MRI and a mammogram every year.   -Breast cancer gene (BRCA)-related cancer risk assessment is recommended for women who have family members with BRCA-related cancers. BRCA-related cancers include breast, ovarian, tubal, and peritoneal cancers. Having family members with these cancers may be associated with an increased risk for harmful changes (mutations) in the breast cancer genes BRCA1 and  BRCA2. Results of the assessment will determine the need for genetic counseling and BRCA1 and BRCA2 testing.   The Pap test is a screening test for cervical cancer. A Pap test can show cell changes on the cervix that might become cervical cancer if left untreated. A Pap test is a procedure in which cells are obtained and examined from the lower end of the uterus (cervix).   - Women should have a Pap test starting at age 57.   - Between ages 90 and 70, Pap tests should be repeated every 2 years.   - Beginning at age 63, you should have a Pap test every 3 years as long as the past 3 Pap tests have been normal.   - Some women have medical problems that increase the chance of getting cervical cancer. Talk to your health care provider about these problems. It is especially important to talk to your health care provider if a  new problem develops soon after your last Pap test. In these cases, your health care provider may recommend more frequent screening and Pap tests.   - The above recommendations are the same for women who have or have not gotten the vaccine for human papillomavirus (HPV).   - If you had a hysterectomy for a problem that was not cancer or a condition that could lead to cancer, then you no longer need Pap tests. Even if you no longer need a Pap test, a regular exam is a good idea to make sure no other problems are starting.   - If you are between ages 36 and 66 years, and you have had normal Pap tests going back 10 years, you no longer need Pap tests. Even if you no longer need a Pap test, a regular exam is a good idea to make sure no other problems are starting.   - If you have had past treatment for cervical cancer or a condition that could lead to cancer, you need Pap tests and screening for cancer for at least 20 years after your treatment.   - If Pap tests have been discontinued, risk factors (such as a new sexual partner) need to be reassessed to determine if screening should  be resumed.   - The HPV test is an additional test that may be used for cervical cancer screening. The HPV test looks for the virus that can cause the cell changes on the cervix. The cells collected during the Pap test can be tested for HPV. The HPV test could be used to screen women aged 70 years and older, and should be used in women of any age who have unclear Pap test results. After the age of 67, women should have HPV testing at the same frequency as a Pap test.   Colorectal cancer can be detected and often prevented. Most routine colorectal cancer screening begins at the age of 57 years and continues through age 26 years. However, your health care provider may recommend screening at an earlier age if you have risk factors for colon cancer. On a yearly basis, your health care provider may provide home test kits to check for hidden blood in the stool.  Use of a small camera at the end of a tube, to directly examine the colon (sigmoidoscopy or colonoscopy), can detect the earliest forms of colorectal cancer. Talk to your health care provider about this at age 23, when routine screening begins. Direct exam of the colon should be repeated every 5 -10 years through age 49 years, unless early forms of pre-cancerous polyps or small growths are found.   People who are at an increased risk for hepatitis B should be screened for this virus. You are considered at high risk for hepatitis B if:  -You were born in a country where hepatitis B occurs often. Talk with your health care provider about which countries are considered high risk.  - Your parents were born in a high-risk country and you have not received a shot to protect against hepatitis B (hepatitis B vaccine).  - You have HIV or AIDS.  - You use needles to inject street drugs.  - You live with, or have sex with, someone who has Hepatitis B.  - You get hemodialysis treatment.  - You take certain medicines for conditions like cancer, organ  transplantation, and autoimmune conditions.   Hepatitis C blood testing is recommended for all people born from [birth year] through 1965 and any individual  with known risks for hepatitis C.   Practice safe sex. Use condoms and avoid high-risk sexual practices to reduce the spread of sexually transmitted infections (STIs). STIs include gonorrhea, chlamydia, syphilis, trichomonas, herpes, HPV, and human immunodeficiency virus (HIV). Herpes, HIV, and HPV are viral illnesses that have no cure. They can result in disability, cancer, and death. Sexually active women aged 25 years and younger should be checked for chlamydia. Older women with new or multiple partners should also be tested for chlamydia. Testing for other STIs is recommended if you are sexually active and at increased risk.   Osteoporosis is a disease in which the bones lose minerals and strength with aging. This can result in serious bone fractures or breaks. The risk of osteoporosis can be identified using a bone density scan. Women ages 65 years and over and women at risk for fractures or osteoporosis should discuss screening with their health care providers. Ask your health care provider whether you should take a calcium supplement or vitamin D to There are also several preventive steps women can take to avoid osteoporosis and resulting fractures or to keep osteoporosis from worsening. -->Recommendations include:  Eat a balanced diet high in fruits, vegetables, calcium, and vitamins.  Get enough calcium. The recommended total intake of is 1,200 mg daily; for best absorption, if taking supplements, divide doses into 250-500 mg doses throughout the day. Of the two types of calcium, calcium carbonate is best absorbed when taken with food but calcium citrate can be taken on an empty stomach.  Get enough vitamin D. NAMS and the National Osteoporosis Foundation recommend at least 1,000 IU per day for women age 50 and over who are at risk of vitamin D  deficiency. Vitamin D deficiency can be caused by inadequate sun exposure (for example, those who live in northern latitudes).  Avoid alcohol and smoking. Heavy alcohol intake (more than 7 drinks per week) increases the risk of falls and hip fracture and women smokers tend to lose bone more rapidly and have lower bone mass than nonsmokers. Stopping smoking is one of the most important changes women can make to improve their health and decrease risk for disease.  Be physically active every day. Weight-bearing exercise (for example, fast walking, hiking, jogging, and weight training) may strengthen bones or slow the rate of bone loss that comes with aging. Balancing and muscle-strengthening exercises can reduce the risk of falling and fracture.  Consider therapeutic medications. Currently, several types of effective drugs are available. Healthcare providers can recommend the type most appropriate for each woman.  Eliminate environmental factors that may contribute to accidents. Falls cause nearly 90% of all osteoporotic fractures, so reducing this risk is an important bone-health strategy. Measures include ample lighting, removing obstructions to walking, using nonskid rugs on floors, and placing mats and/or grab bars in showers.  Be aware of medication side effects. Some common medicines make bones weaker. These include a type of steroid drug called glucocorticoids used for arthritis and asthma, some antiseizure drugs, certain sleeping pills, treatments for endometriosis, and some cancer drugs. An overactive thyroid gland or using too much thyroid hormone for an underactive thyroid can also be a problem. If you are taking these medicines, talk to your doctor about what you can do to help protect your bones.reduce the rate of osteoporosis.    Menopause can be associated with physical symptoms and risks. Hormone replacement therapy is available to decrease symptoms and risks. You should talk to your  health care provider   about whether hormone replacement therapy is right for you.   Use sunscreen. Apply sunscreen liberally and repeatedly throughout the day. You should seek shade when your shadow is shorter than you. Protect yourself by wearing long sleeves, pants, a wide-brimmed hat, and sunglasses year round, whenever you are outdoors.   Once a month, do a whole body skin exam, using a mirror to look at the skin on your back. Tell your health care provider of new moles, moles that have irregular borders, moles that are larger than a pencil eraser, or moles that have changed in shape or color.   -Stay current with required vaccines (immunizations).   Influenza vaccine. All adults should be immunized every year.  Tetanus, diphtheria, and acellular pertussis (Td, Tdap) vaccine. Pregnant women should receive 1 dose of Tdap vaccine during each pregnancy. The dose should be obtained regardless of the length of time since the last dose. Immunization is preferred during the 27th 36th week of gestation. An adult who has not previously received Tdap or who does not know her vaccine status should receive 1 dose of Tdap. This initial dose should be followed by tetanus and diphtheria toxoids (Td) booster doses every 10 years. Adults with an unknown or incomplete history of completing a 3-dose immunization series with Td-containing vaccines should begin or complete a primary immunization series including a Tdap dose. Adults should receive a Td booster every 10 years.  Varicella vaccine. An adult without evidence of immunity to varicella should receive 2 doses or a second dose if she has previously received 1 dose. Pregnant females who do not have evidence of immunity should receive the first dose after pregnancy. This first dose should be obtained before leaving the health care facility. The second dose should be obtained 4 8 weeks after the first dose.  Human papillomavirus (HPV) vaccine. Females aged 13 26  years who have not received the vaccine previously should obtain the 3-dose series. The vaccine is not recommended for use in pregnant females. However, pregnancy testing is not needed before receiving a dose. If a female is found to be pregnant after receiving a dose, no treatment is needed. In that case, the remaining doses should be delayed until after the pregnancy. Immunization is recommended for any person with an immunocompromised condition through the age of 26 years if she did not get any or all doses earlier. During the 3-dose series, the second dose should be obtained 4 8 weeks after the first dose. The third dose should be obtained 24 weeks after the first dose and 16 weeks after the second dose.  Zoster vaccine. One dose is recommended for adults aged 60 years or older unless certain conditions are present.  Measles, mumps, and rubella (MMR) vaccine. Adults born before 1957 generally are considered immune to measles and mumps. Adults born in 1957 or later should have 1 or more doses of MMR vaccine unless there is a contraindication to the vaccine or there is laboratory evidence of immunity to each of the three diseases. A routine second dose of MMR vaccine should be obtained at least 28 days after the first dose for students attending postsecondary schools, health care workers, or international travelers. People who received inactivated measles vaccine or an unknown type of measles vaccine during 1963 1967 should receive 2 doses of MMR vaccine. People who received inactivated mumps vaccine or an unknown type of mumps vaccine before 1979 and are at high risk for mumps infection should consider immunization with 2 doses of   MMR vaccine. For females of childbearing age, rubella immunity should be determined. If there is no evidence of immunity, females who are not pregnant should be vaccinated. If there is no evidence of immunity, females who are pregnant should delay immunization until after pregnancy.  Unvaccinated health care workers born before [birth year] who lack laboratory evidence of measles, mumps, or rubella immunity or laboratory confirmation of disease should consider measles and mumps immunization with 2 doses of MMR vaccine or rubella immunization with 1 dose of MMR vaccine.  Pneumococcal 13-valent conjugate (PCV13) vaccine. When indicated, a person who is uncertain of her immunization history and has no record of immunization should receive the PCV13 vaccine. An adult aged 54 years or older who has certain medical conditions and has not been previously immunized should receive 1 dose of PCV13 vaccine. This PCV13 should be followed with a dose of pneumococcal polysaccharide (PPSV23) vaccine. The PPSV23 vaccine dose should be obtained at least 8 weeks after the dose of PCV13 vaccine. An adult aged 58 years or older who has certain medical conditions and previously received 1 or more doses of PPSV23 vaccine should receive 1 dose of PCV13. The PCV13 vaccine dose should be obtained 1 or more years after the last PPSV23 vaccine dose.  Pneumococcal polysaccharide (PPSV23) vaccine. When PCV13 is also indicated, PCV13 should be obtained first. All adults aged 58 years and older should be immunized. An adult younger than age 65 years who has certain medical conditions should be immunized. Any person who resides in a nursing home or long-term care facility should be immunized. An adult smoker should be immunized. People with an immunocompromised condition and certain other conditions should receive both PCV13 and PPSV23 vaccines. People with human immunodeficiency virus (HIV) infection should be immunized as soon as possible after diagnosis. Immunization during chemotherapy or radiation therapy should be avoided. Routine use of PPSV23 vaccine is not recommended for American Indians, [place] Natives, or people younger than 65 years unless there are medical conditions that require PPSV23 vaccine. When indicated,  people who have unknown immunization and have no record of immunization should receive PPSV23 vaccine. One-time revaccination 5 years after the first dose of PPSV23 is recommended for people aged 70 64 years who have chronic kidney failure, nephrotic syndrome, asplenia, or immunocompromised conditions. People who received 1 2 doses of PPSV23 before age 32 years should receive another dose of PPSV23 vaccine at age 96 years or later if at least 5 years have passed since the previous dose. Doses of PPSV23 are not needed for people immunized with PPSV23 at or after age 55 years.  Meningococcal vaccine. Adults with asplenia or persistent complement component deficiencies should receive 2 doses of quadrivalent meningococcal conjugate (MenACWY-D) vaccine. The doses should be obtained at least 2 months apart. Microbiologists working with certain meningococcal bacteria, [employer] recruits, people at risk during an outbreak, and people who travel to or live in countries with a high rate of meningitis should be immunized. A first-year college student up through age 58 years who is living in a residence hall should receive a dose if she did not receive a dose on or after her 16th birthday. Adults who have certain high-risk conditions should receive one or more doses of vaccine.  Hepatitis A vaccine. Adults who wish to be protected from this disease, have certain high-risk conditions, work with hepatitis A-infected animals, work in hepatitis A research labs, or travel to or work in countries with a high rate of hepatitis A should be  immunized. Adults who were previously unvaccinated and who anticipate close contact with an international adoptee during the first 60 days after arrival in the [place] from a country with a high rate of hepatitis A should be immunized.  Hepatitis B vaccine.  Adults who wish to be protected from this disease, have certain high-risk conditions, may be exposed to blood or other infectious  body fluids, are household contacts or sex partners of hepatitis B positive people, are clients or workers in certain care facilities, or travel to or work in countries with a high rate of hepatitis B should be immunized.  Haemophilus influenzae type b (Hib) vaccine. A previously unvaccinated person with asplenia or sickle cell disease or having a scheduled splenectomy should receive 1 dose of Hib vaccine. Regardless of previous immunization, a recipient of a hematopoietic stem cell transplant should receive a 3-dose series 6 12 months after her successful transplant. Hib vaccine is not recommended for adults with HIV infection.  Preventive Services / Frequency Ages 6 to 39years  Blood pressure check.** / Every 1 to 2 years.  Lipid and cholesterol check.** / Every 5 years beginning at age 39.  Clinical breast exam.** / Every 3 years for women in their 61s and 62s.  BRCA-related cancer risk assessment.** / For women who have family members with a BRCA-related cancer (breast, ovarian, tubal, or peritoneal cancers).  Pap test.** / Every 2 years from ages 47 through 85. Every 3 years starting at age 34 through age 12 or 74 with a history of 3 consecutive normal Pap tests.  HPV screening.** / Every 3 years from ages 46 through ages 43 to 54 with a history of 3 consecutive normal Pap tests.  Hepatitis C blood test.** / For any individual with known risks for hepatitis C.  Skin self-exam. / Monthly.  Influenza vaccine. / Every year.  Tetanus, diphtheria, and acellular pertussis (Tdap, Td) vaccine.** / Consult your health care provider. Pregnant women should receive 1 dose of Tdap vaccine during each pregnancy. 1 dose of Td every 10 years.  Varicella vaccine.** / Consult your health care provider. Pregnant females who do not have evidence of immunity should receive the first dose after pregnancy.  HPV vaccine. / 3 doses over 6 months, if 64 and younger. The vaccine is not recommended for use in  pregnant females. However, pregnancy testing is not needed before receiving a dose.  Measles, mumps, rubella (MMR) vaccine.** / You need at least 1 dose of MMR if you were born in 1957 or later. You may also need a 2nd dose. For females of childbearing age, rubella immunity should be determined. If there is no evidence of immunity, females who are not pregnant should be vaccinated. If there is no evidence of immunity, females who are pregnant should delay immunization until after pregnancy.  Pneumococcal 13-valent conjugate (PCV13) vaccine.** / Consult your health care provider.  Pneumococcal polysaccharide (PPSV23) vaccine.** / 1 to 2 doses if you smoke cigarettes or if you have certain conditions.  Meningococcal vaccine.** / 1 dose if you are age 71 to 36 years and a Market researcher living in a residence hall, or have one of several medical conditions, you need to get vaccinated against meningococcal disease. You may also need additional booster doses.  Hepatitis A vaccine.** / Consult your health care provider.  Hepatitis B vaccine.** / Consult your health care provider.  Haemophilus influenzae type b (Hib) vaccine.** / Consult your health care provider.  Ages 55 to 64years  Blood pressure check.** / Every 1 to 2 years.  Lipid and cholesterol check.** / Every 5 years beginning at age 20 years.  Lung cancer screening. / Every year if you are aged 55 80 years and have a 30-pack-year history of smoking and currently smoke or have quit within the past 15 years. Yearly screening is stopped once you have quit smoking for at least 15 years or develop a health problem that would prevent you from having lung cancer treatment.  Clinical breast exam.** / Every year after age 40 years.  BRCA-related cancer risk assessment.** / For women who have family members with a BRCA-related cancer (breast, ovarian, tubal, or peritoneal cancers).  Mammogram.** / Every year beginning at age 40  years and continuing for as long as you are in good health. Consult with your health care provider.  Pap test.** / Every 3 years starting at age 30 years through age 65 or 70 years with a history of 3 consecutive normal Pap tests.  HPV screening.** / Every 3 years from ages 30 years through ages 65 to 70 years with a history of 3 consecutive normal Pap tests.  Fecal occult blood test (FOBT) of stool. / Every year beginning at age 50 years and continuing until age 75 years. You may not need to do this test if you get a colonoscopy every 10 years.  Flexible sigmoidoscopy or colonoscopy.** / Every 5 years for a flexible sigmoidoscopy or every 10 years for a colonoscopy beginning at age 50 years and continuing until age 75 years.  Hepatitis C blood test.** / For all people born from 1945 through 1965 and any individual with known risks for hepatitis C.  Skin self-exam. / Monthly.  Influenza vaccine. / Every year.  Tetanus, diphtheria, and acellular pertussis (Tdap/Td) vaccine.** / Consult your health care provider. Pregnant women should receive 1 dose of Tdap vaccine during each pregnancy. 1 dose of Td every 10 years.  Varicella vaccine.** / Consult your health care provider. Pregnant females who do not have evidence of immunity should receive the first dose after pregnancy.  Zoster vaccine.** / 1 dose for adults aged 60 years or older.  Measles, mumps, rubella (MMR) vaccine.** / You need at least 1 dose of MMR if you were born in 1957 or later. You may also need a 2nd dose. For females of childbearing age, rubella immunity should be determined. If there is no evidence of immunity, females who are not pregnant should be vaccinated. If there is no evidence of immunity, females who are pregnant should delay immunization until after pregnancy.  Pneumococcal 13-valent conjugate (PCV13) vaccine.** / Consult your health care provider.  Pneumococcal polysaccharide (PPSV23) vaccine.** / 1 to 2 doses if  you smoke cigarettes or if you have certain conditions.  Meningococcal vaccine.** / Consult your health care provider.  Hepatitis A vaccine.** / Consult your health care provider.  Hepatitis B vaccine.** / Consult your health care provider.  Haemophilus influenzae type b (Hib) vaccine.** / Consult your health care provider.  Ages 65 years and over  Blood pressure check.** / Every 1 to 2 years.  Lipid and cholesterol check.** / Every 5 years beginning at age 20 years.  Lung cancer screening. / Every year if you are aged 55 80 years and have a 30-pack-year history of smoking and currently smoke or have quit within the past 15 years. Yearly screening is stopped once you have quit smoking for at least 15 years or develop a health problem that   would prevent you from having lung cancer treatment.  Clinical breast exam.** / Every year after age 103 years.  BRCA-related cancer risk assessment.** / For women who have family members with a BRCA-related cancer (breast, ovarian, tubal, or peritoneal cancers).  Mammogram.** / Every year beginning at age 36 years and continuing for as long as you are in good health. Consult with your health care provider.  Pap test.** / Every 3 years starting at age 5 years through age 85 or 10 years with 3 consecutive normal Pap tests. Testing can be stopped between 65 and 70 years with 3 consecutive normal Pap tests and no abnormal Pap or HPV tests in the past 10 years.  HPV screening.** / Every 3 years from ages 93 years through ages 70 or 45 years with a history of 3 consecutive normal Pap tests. Testing can be stopped between 65 and 70 years with 3 consecutive normal Pap tests and no abnormal Pap or HPV tests in the past 10 years.  Fecal occult blood test (FOBT) of stool. / Every year beginning at age 8 years and continuing until age 45 years. You may not need to do this test if you get a colonoscopy every 10 years.  Flexible sigmoidoscopy or colonoscopy.** /  Every 5 years for a flexible sigmoidoscopy or every 10 years for a colonoscopy beginning at age 69 years and continuing until age 68 years.  Hepatitis C blood test.** / For all people born from [birth year] through 1966 and any individual with known risks for hepatitis C.  Osteoporosis screening.** / A one-time screening for women ages 7 years and over and women at risk for fractures or osteoporosis.  Skin self-exam. / Monthly.  Influenza vaccine. / Every year.  Tetanus, diphtheria, and acellular pertussis (Tdap/Td) vaccine.** / 1 dose of Td every 10 years.  Varicella vaccine.** / Consult your health care provider.  Zoster vaccine.** / 1 dose for adults aged 5 years or older.  Pneumococcal 13-valent conjugate (PCV13) vaccine.** / Consult your health care provider.  Pneumococcal polysaccharide (PPSV23) vaccine.** / 1 dose for all adults aged 74 years and older.  Meningococcal vaccine.** / Consult your health care provider.  Hepatitis A vaccine.** / Consult your health care provider.  Hepatitis B vaccine.** / Consult your health care provider.  Haemophilus influenzae type b (Hib) vaccine.** / Consult your health care provider. ** Family history and personal history of risk and conditions may change your health care provider's recommendations. Document Released: 12/26/2001 Document Revised: 08/20/2013  [facility] Patient Information 2014 [place], [place].   EXERCISE AND DIET:  We recommended that you start or continue a regular exercise program for good health. Regular exercise means any activity that makes your heart beat faster and makes you sweat.  We recommend exercising at least 30 minutes per day at least 3 days a week, preferably 5.  We also recommend a diet low in fat and sugar / carbohydrates.  Inactivity, poor dietary choices and obesity can cause diabetes, heart attack, stroke, and kidney damage, among others.     ALCOHOL AND SMOKING:  Women should limit their alcohol intake to no  more than 7 drinks/beers/glasses of wine (combined, not each!) per week. Moderation of alcohol intake to this level decreases your risk of breast cancer and liver damage.  ( And of course, no recreational drugs are part of a healthy lifestyle.)  Also, you should not be smoking at all or even being exposed to second hand smoke. Most people know smoking can  cause cancer, and various heart and lung diseases, but did you know it also contributes to weakening of your bones?  Aging of your skin?  Yellowing of your teeth and nails?   CALCIUM AND VITAMIN D:  Adequate intake of calcium and Vitamin D are recommended.  The recommendations for exact amounts of these supplements seem to change often, but generally speaking 600 mg of calcium (either carbonate or citrate) and 800 units of Vitamin D per day seems prudent. Certain women may benefit from higher intake of Vitamin D.  If you are among these women, your doctor will have told you during your visit.     PAP SMEARS:  Pap smears, to check for cervical cancer or precancers,  have traditionally been done yearly, although recent scientific advances have shown that most women can have pap smears less often.  However, every woman still should have a physical exam from her gynecologist or primary care physician every year. It will include a breast check, inspection of the vulva and vagina to check for abnormal growths or skin changes, a visual exam of the cervix, and then an exam to evaluate the size and shape of the uterus and ovaries.  And after 46 years of age, a rectal exam is indicated to check for rectal cancers. We will also provide age appropriate advice regarding health maintenance, like when you should have certain vaccines, screening for sexually transmitted diseases, bone density testing, colonoscopy, mammograms, etc.    MAMMOGRAMS:  All women over 71 years old should have a yearly mammogram. Many facilities now offer a "3D" mammogram, which may cost  around $50 extra out of pocket. If possible,  we recommend you accept the option to have the 3D mammogram performed.  It both reduces the number of women who will be called back for extra views which then turn out to be normal, and it is better than the routine mammogram at detecting truly abnormal areas.     COLONOSCOPY:  Colonoscopy to screen for colon cancer is recommended for all women at age 52.  We know, you hate the idea of the prep.  We agree, BUT, having colon cancer and not knowing it is worse!!  Colon cancer so often starts as a polyp that can be seen and removed at colonscopy, which can quite literally save your life!  And if your first colonoscopy is normal and you have no family history of colon cancer, most women don't have to have it again for 10 years.  Once every ten years, you can do something that may end up saving your life, right?  We will be happy to help you get it scheduled when you are ready.  Be sure to check your insurance coverage so you understand how much it will cost.  It may be covered as a preventative service at no cost, but you should check your particular policy.

## 2016-10-26 LAB — HSV(HERPES SIMPLEX VRS) I + II AB-IGG
HSV 1 Glycoprotein G Ab, IgG: 33.8 Index — ABNORMAL HIGH (ref <0.90)
HSV 2 Glycoprotein G Ab, IgG: 12.9 Index — ABNORMAL HIGH (ref <0.90)

## 2016-10-26 LAB — HEPATITIS C ANTIBODY: HCV Ab: NEGATIVE

## 2016-10-26 LAB — HIV ANTIBODY (ROUTINE TESTING W REFLEX): HIV 1&2 Ab, 4th Generation: NONREACTIVE

## 2016-10-26 LAB — HEPATITIS B SURFACE ANTIGEN: Hepatitis B Surface Ag: NEGATIVE

## 2016-10-26 LAB — RPR

## 2016-10-27 LAB — GC/CHLAMYDIA PROBE AMP
CT Probe RNA: DETECTED — AB
GC Probe RNA: NOT DETECTED

## 2016-10-30 ENCOUNTER — Telehealth: Payer: Self-pay | Admitting: Family Medicine

## 2016-10-30 NOTE — Telephone Encounter (Signed)
PLEASE ADVISE.  Ariel King, CMA

## 2016-10-30 NOTE — Telephone Encounter (Signed)
One time is OK.  No further RF's- will need to be assessed if she needs it.

## 2016-10-30 NOTE — Telephone Encounter (Signed)
Patient is requesting a refill of the Tussionex

## 2016-10-31 MED ORDER — HYDROCOD POLST-CPM POLST ER 10-8 MG/5ML PO SUER
5.0000 mL | Freq: Two times a day (BID) | ORAL | 0 refills | Status: DC | PRN
Start: 2016-10-31 — End: 2017-03-22

## 2016-10-31 NOTE — Addendum Note (Signed)
Addended by: Fonnie Mu on: 10/31/2016 08:38 AM   Modules accepted: Orders

## 2016-10-31 NOTE — Telephone Encounter (Signed)
Pt informed RX ready for pick up.  Charyl Bigger, CMA

## 2016-11-01 ENCOUNTER — Other Ambulatory Visit: Payer: Self-pay

## 2016-11-01 NOTE — Progress Notes (Unsigned)
Opened in Error.

## 2016-11-02 ENCOUNTER — Other Ambulatory Visit: Payer: Self-pay

## 2016-11-02 MED ORDER — AZITHROMYCIN 1 G PO PACK: 1.0000 g | PACK | Freq: Once | ORAL | 0 refills | Status: AC

## 2016-11-02 NOTE — Progress Notes (Unsigned)
11/02/16 Azithromycin 1 gram powder was called in to pharmacy. Called pharmacy to change 1 gram powder to tablet. Bobetta Lime CMA, RT

## 2016-11-10 ENCOUNTER — Ambulatory Visit: Payer: Self-pay | Admitting: Family Medicine

## 2016-11-22 ENCOUNTER — Other Ambulatory Visit: Payer: BC Managed Care – PPO

## 2016-11-22 ENCOUNTER — Other Ambulatory Visit: Payer: Self-pay

## 2016-11-22 DIAGNOSIS — E039 Hypothyroidism, unspecified: Secondary | ICD-10-CM

## 2016-11-22 DIAGNOSIS — Z8619 Personal history of other infectious and parasitic diseases: Secondary | ICD-10-CM

## 2017-01-17 ENCOUNTER — Ambulatory Visit: Payer: BC Managed Care – PPO | Admitting: Family Medicine

## 2017-02-28 ENCOUNTER — Other Ambulatory Visit: Payer: BC Managed Care – PPO

## 2017-03-05 ENCOUNTER — Ambulatory Visit: Payer: BC Managed Care – PPO | Admitting: Family Medicine

## 2017-03-05 DIAGNOSIS — F332 Major depressive disorder, recurrent severe without psychotic features: Secondary | ICD-10-CM | POA: Insufficient documentation

## 2017-03-15 ENCOUNTER — Telehealth: Payer: Self-pay

## 2017-03-15 ENCOUNTER — Other Ambulatory Visit: Payer: Self-pay | Admitting: Family Medicine

## 2017-03-15 NOTE — Telephone Encounter (Signed)
Please call pt to schedule thyroid follow up labs and OV with Dr. Raliegh Scarlet.  Refill only approved for 30 days.  Must have this done before any further refills.  Charyl Bigger, CMA

## 2017-03-21 ENCOUNTER — Other Ambulatory Visit: Payer: BC Managed Care – PPO

## 2017-03-22 ENCOUNTER — Encounter: Payer: Self-pay | Admitting: Family Medicine

## 2017-03-22 ENCOUNTER — Ambulatory Visit (INDEPENDENT_AMBULATORY_CARE_PROVIDER_SITE_OTHER): Payer: Medicare Other | Admitting: Family Medicine

## 2017-03-22 VITALS — BP 121/84 | HR 97 | Ht 63.75 in | Wt 161.8 lb

## 2017-03-22 DIAGNOSIS — E669 Obesity, unspecified: Secondary | ICD-10-CM

## 2017-03-22 DIAGNOSIS — R8290 Unspecified abnormal findings in urine: Secondary | ICD-10-CM | POA: Diagnosis not present

## 2017-03-22 DIAGNOSIS — F32A Depression, unspecified: Secondary | ICD-10-CM

## 2017-03-22 DIAGNOSIS — F419 Anxiety disorder, unspecified: Secondary | ICD-10-CM | POA: Diagnosis not present

## 2017-03-22 DIAGNOSIS — Z8619 Personal history of other infectious and parasitic diseases: Secondary | ICD-10-CM | POA: Diagnosis not present

## 2017-03-22 DIAGNOSIS — R109 Unspecified abdominal pain: Secondary | ICD-10-CM

## 2017-03-22 DIAGNOSIS — E039 Hypothyroidism, unspecified: Secondary | ICD-10-CM

## 2017-03-22 DIAGNOSIS — E559 Vitamin D deficiency, unspecified: Secondary | ICD-10-CM

## 2017-03-22 DIAGNOSIS — F329 Major depressive disorder, single episode, unspecified: Secondary | ICD-10-CM | POA: Diagnosis not present

## 2017-03-22 DIAGNOSIS — R739 Hyperglycemia, unspecified: Secondary | ICD-10-CM

## 2017-03-22 DIAGNOSIS — S39012A Strain of muscle, fascia and tendon of lower back, initial encounter: Secondary | ICD-10-CM

## 2017-03-22 DIAGNOSIS — T148XXA Other injury of unspecified body region, initial encounter: Secondary | ICD-10-CM | POA: Diagnosis not present

## 2017-03-22 DIAGNOSIS — E781 Pure hyperglyceridemia: Secondary | ICD-10-CM | POA: Diagnosis not present

## 2017-03-22 LAB — POCT URINALYSIS DIPSTICK
Bilirubin, UA: NEGATIVE
Blood, UA: NEGATIVE
Glucose, UA: NEGATIVE
Ketones, UA: NEGATIVE
Leukocytes, UA: NEGATIVE
Nitrite, UA: POSITIVE
Protein, UA: NEGATIVE
Spec Grav, UA: 1.015 (ref 1.010–1.025)
Urobilinogen, UA: 0.2 E.U./dL
pH, UA: 7.5 (ref 5.0–8.0)

## 2017-03-22 MED ORDER — IBUPROFEN 600 MG PO TABS: 600.0000 mg | ORAL_TABLET | Freq: Three times a day (TID) | ORAL | 0 refills | Status: DC | PRN

## 2017-03-22 MED ORDER — CYCLOBENZAPRINE HCL 10 MG PO TABS: 10.0000 mg | ORAL_TABLET | Freq: Three times a day (TID) | ORAL | 0 refills | Status: DC | PRN

## 2017-03-22 NOTE — Patient Instructions (Addendum)
If Vit D level is not in the 50-60 range, I rec a daily supp as well as the wkly.  2-5,000 IU daily OTC.   Pt prefer f/up to discuss labs uin person- make appt   Mid-Back Strain Rehab Ask your health care provider which exercises are safe for you. Do exercises exactly as told by your health care provider and adjust them as directed. It is normal to feel mild stretching, pulling, tightness, or discomfort as you do these exercises, but you should stop right away if you feel sudden pain or your pain gets worse. Do not begin these exercises until told by your health care provider. Stretching and range of motion exercises This exercise warms up your muscles and joints and improves the movement and flexibility of your back and shoulders. This exercise also help to relieve pain. Exercise A: Chest and spine stretch   1. Lie down on your back on a firm surface. 2. Roll a towel or a small blanket so it is about 4 inches (10 cm) in diameter. 3. Put the towel lengthwise under the middle of your back so it is under your spine, but not under your shoulder blades. 4. To increase the stretch, you may put your hands behind your head and let your elbows fall to your sides. 5. Hold for __________ seconds. Repeat exercise __________ times. Complete this exercise __________ times a day. Strengthening exercises These exercises build strength and endurance in your back and your shoulder blade muscles. Endurance is the ability to use your muscles for a long time, even after they get tired. Exercise B: Alternating arm and leg raises   1. Get on your hands and knees on a firm surface. If you are on a hard floor, you may want to use padding to cushion your knees, such as an exercise mat. 2. Line up your arms and legs. Your hands should be below your shoulders, and your knees should be below your hips. 3. Lift your left leg behind you. At the same time, raise your right arm and straighten it in front of you.  Do not  lift your leg higher than your hip.  Do not lift your arm higher than your shoulder.  Keep your abdominal and back muscles tight.  Keep your hips facing the ground.  Do not arch your back.  Keep your balance carefully, and do not hold your breath. 4. Hold for __________ seconds. 5. Slowly return to the starting position and repeat with your right leg and your left arm. Repeat __________ times. Complete this exercise __________ times a day. Exercise C: Straight arm rows (  shoulder extension) 1. Stand with your feet shoulder width apart. 2. Secure an exercise band to a stable object in front of you so the band is at or above shoulder height. 3. Hold one end of the exercise band in each hand. 4. Straighten your elbows and lift your hands up to shoulder height. 5. Step back, away from the secured end of the exercise band, until the band stretches. 6. Squeeze your shoulder blades together and pull your hands down to the sides of your thighs. Stop when your hands are straight down by your sides. Do not let your hands go behind your body. 7. Hold for __________ seconds. 8. Slowly return to the starting position. Repeat __________ times. Complete this exercise __________ times a day. Exercise D: Shoulder external rotation, prone 1. Lie on your abdomen on a firm bed so your left / right  forearm hangs over the edge of the bed and your upper arm is on the bed, straight out from your body.  Your elbow should be bent.  Your palm should be facing your feet. 2. If instructed, hold a __________ weight in your hand. 3. Squeeze your shoulder blade toward the middle of your back. Do not let your shoulder lift toward your ear. 4. Keep your elbow bent in an "L" shape (90 degrees) while you slowly move your forearm up toward the ceiling. Move your forearm up to the height of the bed, toward your head.  Your upper arm should not move.  At the top of the movement, your palm should face the  floor. 5. Hold for __________ seconds. 6. Slowly return to the starting position and relax your muscles. Repeat __________ times. Complete this exercise __________ times a day. Exercise E: Scapular retraction and external rotation, rowing  1. Sit in a stable chair without armrests, or stand. 2. Secure an exercise band to a stable object in front of you so it is at shoulder height. 3. Hold one end of the exercise band in each hand. 4. Bring your arms out straight in front of you. 5. Step back, away from the secured end of the exercise band, until the band stretches. 6. Pull the band backward. As you do this, bend your elbows and squeeze your shoulder blades together, but avoid letting the rest of your body move. Do not let your shoulders lift up toward your ears. 7. Stop when your elbows are at your sides or slightly behind your body. 8. Hold for __________ seconds. 9. Slowly straighten your arms to return to the starting position. Repeat __________ times. Complete this exercise __________ times a day. Posture and body mechanics  Body mechanics refers to the movements and positions of your body while you do your daily activities. Posture is part of body mechanics. Good posture and healthy body mechanics can help to relieve stress in your body's tissues and joints. Good posture means that your spine is in its natural S-curve position (your spine is neutral), your shoulders are pulled back slightly, and your head is not tipped forward. The following are general guidelines for applying improved posture and body mechanics to your everyday activities. Standing   When standing, keep your spine neutral and your feet about hip-width apart. Keep a slight bend in your knees. Your ears, shoulders, and hips should line up.  When you do a task in which you lean forward while standing in one place for a long time, place one foot up on a stable object that is 2-4 inches (5-10 cm) high, such as a footstool.  This helps keep your spine neutral. Sitting   When sitting, keep your spine neutral and keep your feet flat on the floor. Use a footrest, if necessary, and keep your thighs parallel to the floor. Avoid rounding your shoulders, and avoid tilting your head forward.  When working at a desk or a computer, keep your desk at a height where your hands are slightly lower than your elbows. Slide your chair under your desk so you are close enough to maintain good posture.  When working at a computer, place your monitor at a height where you are looking straight ahead and you do not have to tilt your head forward or downward to look at the screen. Resting  When lying down and resting, avoid positions that are most painful for you.  If you have pain with activities  such as sitting, bending, stooping, or squatting (flexion-based activities), lie in a position in which your body does not bend very much. For example, avoid curling up on your side with your arms and knees near your chest (fetal position).  If you have pain with activities such as standing for a long time or reaching with your arms (extension-based activities), lie with your spine in a neutral position and bend your knees slightly. Try the following positions:  Lying on your side with a pillow between your knees.  Lying on your back with a pillow under your knees. Lifting   When lifting objects, keep your feet at least shoulder-width apart and tighten your abdominal muscles.  Bend your knees and hips and keep your spine neutral. It is important to lift using the strength of your legs, not your back. Do not lock your knees straight out.  Always ask for help to lift heavy or awkward objects. This information is not intended to replace advice given to you by your health care provider. Make sure you discuss any questions you have with your health care provider. Document Released: 10/30/2005 Document Revised: 07/06/2016 Document Reviewed:  08/11/2015 Elsevier Interactive Patient Education  2017 [employer] Ask your health care provider which exercises are safe for you. Do exercises exactly as told by your health care provider and adjust them as directed. It is normal to feel mild stretching, pulling, tightness, or discomfort as you do these exercises, but you should stop right away if you feel sudden pain or your pain gets worse. Do not begin these exercises until told by your health care provider. Stretching and range of motion exercises These exercises warm up your muscles and joints and improve the movement and flexibility of your back. These exercises also help to relieve pain, numbness, and tingling. Exercise A: Lumbar rotation   6. Lie on your back on a firm surface and bend your knees. 7. Straighten your arms out to your sides so each arm forms an "L" shape with a side of your body (a 90 degree angle). 8. Slowly move both of your knees to one side of your body until you feel a stretch in your lower back. Try not to let your shoulders move off of the floor. 9. Hold for __________ seconds. 10. Tense your abdominal muscles and slowly move your knees back to the starting position. 11. Repeat this exercise on the other side of your body. Repeat __________ times. Complete this exercise __________ times a day. Exercise B: Prone extension on elbows   6. Lie on your abdomen on a firm surface. 7. Prop yourself up on your elbows. 8. Use your arms to help lift your chest up until you feel a gentle stretch in your abdomen and your lower back.  This will place some of your body weight on your elbows. If this is uncomfortable, try stacking pillows under your chest.  Your hips should stay down, against the surface that you are lying on. Keep your hip and back muscles relaxed. 9. Hold for __________ seconds. 10. Slowly relax your upper body and return to the starting position. Repeat __________ times.  Complete this exercise __________ times a day. Strengthening exercises These exercises build strength and endurance in your back. Endurance is the ability to use your muscles for a long time, even after they get tired. Exercise C: Pelvic tilt  1. Lie on your back on a firm surface. Bend your knees and  keep your feet flat. 2. Tense your abdominal muscles. Tip your pelvis up toward the ceiling and flatten your lower back into the floor.  To help with this exercise, you may place a small towel under your lower back and try to push your back into the towel. 3. Hold for __________ seconds. 4. Let your muscles relax completely before you repeat this exercise. Repeat __________ times. Complete this exercise __________ times a day. Exercise D: Alternating arm and leg raises   7. Get on your hands and knees on a firm surface. If you are on a hard floor, you may want to use padding to cushion your knees, such as an exercise mat. 8. Line up your arms and legs. Your hands should be below your shoulders, and your knees should be below your hips. 9. Lift your left leg behind you. At the same time, raise your right arm and straighten it in front of you.  Do not lift your leg higher than your hip.  Do not lift your arm higher than your shoulder.  Keep your abdominal and back muscles tight.  Keep your hips facing the ground.  Do not arch your back.  Keep your balance carefully, and do not hold your breath. 10. Hold for __________ seconds. 11. Slowly return to the starting position and repeat with your right leg and your left arm. Repeat __________ times. Complete this exercise __________ times a day. Exercise E: Abdominal set with straight leg raise   1. Lie on your back on a firm surface. 2. Bend one of your knees and keep your other leg straight. 3. Tense your abdominal muscles and lift your straight leg up, 4-6 inches (10-15 cm) off the ground. 4. Keep your abdominal muscles tight and hold for  __________ seconds.  Do not hold your breath.  Do not arch your back. Keep it flat against the ground. 5. Keep your abdominal muscles tense as you slowly lower your leg back to the starting position. 6. Repeat with your other leg. Repeat __________ times. Complete this exercise __________ times a day. Posture and body mechanics   Body mechanics refers to the movements and positions of your body while you do your daily activities. Posture is part of body mechanics. Good posture and healthy body mechanics can help to relieve stress in your body's tissues and joints. Good posture means that your spine is in its natural S-curve position (your spine is neutral), your shoulders are pulled back slightly, and your head is not tipped forward. The following are general guidelines for applying improved posture and body mechanics to your everyday activities. Standing    When standing, keep your spine neutral and your feet about hip-width apart. Keep a slight bend in your knees. Your ears, shoulders, and hips should line up.  When you do a task in which you stand in one place for a long time, place one foot up on a stable object that is 2-4 inches (5-10 cm) high, such as a footstool. This helps keep your spine neutral. Sitting    When sitting, keep your spine neutral and keep your feet flat on the floor. Use a footrest, if necessary, and keep your thighs parallel to the floor. Avoid rounding your shoulders, and avoid tilting your head forward.  When working at a desk or a computer, keep your desk at a height where your hands are slightly lower than your elbows. Slide your chair under your desk so you are close enough to maintain good  posture.  When working at a computer, place your monitor at a height where you are looking straight ahead and you do not have to tilt your head forward or downward to look at the screen. Resting    When lying down and resting, avoid positions that are most painful for  you.  If you have pain with activities such as sitting, bending, stooping, or squatting (flexion-based activities), lie in a position in which your body does not bend very much. For example, avoid curling up on your side with your arms and knees near your chest (fetal position).  If you have pain with activities such as standing for a long time or reaching with your arms (extension-based activities), lie with your spine in a neutral position and bend your knees slightly. Try the following positions:  Lying on your side with a pillow between your knees.  Lying on your back with a pillow under your knees. Lifting    When lifting objects, keep your feet at least shoulder-width apart and tighten your abdominal muscles.  Bend your knees and hips and keep your spine neutral. It is important to lift using the strength of your legs, not your back. Do not lock your knees straight out.  Always ask for help to lift heavy or awkward objects. This information is not intended to replace advice given to you by your health care provider. Make sure you discuss any questions you have with your health care provider. Document Released: 10/30/2005 Document Revised: 07/06/2016 Document Reviewed: 08/11/2015 Elsevier Interactive Patient Education  2017 [employer].

## 2017-03-22 NOTE — Progress Notes (Signed)
Impression and Recommendations:    1. Lumbar spine strain, initial encounter   2. Muscle strain   3. Flank pain/ back pain   4. Urine malodor   5. Abnormal urinalysis   6. Vitamin D deficiency   7. Obesity (BMI 30-39.9)   8. h/o Hypertriglyceridemia   9. h/o Blood glucose elevated   10. Depression, unspecified depression type   11. Adult hypothyroidism   12. Anxiety   13. History of chlamydia    - handouts for lumbar strain; walking daily, avoid aggravating activities, Flexeril/ ibuprofen prn - repeat urine Chylmidia--> h/o recent positive STD; check for clearance - for urine--> send for Cx since only + nitrates and no hx UTI sx, WBC, RBC ---> tx only if Cx + - repeat labs--> thyroid, chol etc.--> see orders below - pt will consider obtaining counselor ; has f/up w psychiatry upcoming- no SI and she is stable.   - cont wt loss- good job  The patient was counseled, risk factors were discussed, anticipatory guidance given.   Meds ordered this encounter  Medications  . cyclobenzaprine (FLEXERIL) 10 MG tablet    Sig: Take 1 tablet (10 mg total) by mouth 3 (three) times daily as needed for muscle spasms.    Dispense:  30 tablet    Refill:  0  . ibuprofen (ADVIL,MOTRIN) 600 MG tablet    Sig: Take 1 tablet (600 mg total) by mouth every 8 (eight) hours as needed for moderate pain (short term use only).    Dispense:  30 tablet    Refill:  0    Orders Placed This Encounter  Procedures  . CULTURE, URINE COMPREHENSIVE  . GC/Chlamydia Probe Amp  . Lipid panel  . TSH  . T4, free  . VITAMIN D 25 Hydroxy (Vit-D Deficiency, Fractures)  . GC/chlamydia probe amp, urine  . POCT urinalysis dipstick    Gross side effects, risk and benefits, and alternatives of medications and treatment plan in general discussed with patient.  Patient is aware that all medications have potential side effects and we are unable to predict every side effect or drug-drug interaction that may occur.    Patient will call with any questions prior to using medication if they have concerns.  Expresses verbal understanding and consents to current therapy and treatment regimen.  No barriers to understanding were identified.  Red flag symptoms and signs discussed in detail.  Patient expressed understanding regarding what to do in case of emergency\urgent symptoms  Please see AVS handed out to patient at the end of our visit for further patient instructions/ counseling done pertaining to today's office visit.   Return for f/up near future.     Note: This document was prepared using Dragon voice recognition software and may include unintentional dictation errors.  Mellody Dance 9:08 PM --------------------------------------------------------------------------------------------------------------------------------------------------------------------------------------------------------------------------------------------    Subjective:    CC:  Chief Complaint  Patient presents with  . Hyperlipidemia  . Flank Pain  . malodorous urine    HPI: Ariel King is a 46 y.o. female who presents to [place] [facility] today for issues as discussed below.   Back pain: going on 2 wks now. Hurts all time b/l lower back.  Achy pain- W with bending.   No exercise in 3 wks now.   no raditation down leg.  No loss B/Bl.  Feels like the lumbar strains she has had in the past. No inury or insulting event.  -  No vag d/c or abd pain,  Smelly urine 1st of the am- fine rest of day.    "Nerves are bad"  --> family is irritating her bad.   Mom and Dad live upstairs and pt tries to confide in her and pt upset that Mom is too controlling. Tells people pt's business.   Sister backstabbing as well.  Doesn;t go for counseling- not for many yrs.   HyperTG - on niacin- taking it daily, did not reck since July  '17.   Pt wants to know if she can come off the meds since she has been working out and  losing wt  Vit D:  5ok iu wkly; tol well, no s-e  Wt:  Lost 20 lbs almost since coming to [place] in July '17- working out with trainer and eating a little better.   Hasn;t done anything 3 wks since back starting hurting her- frustrated today   Wt Readings from Last 3 Encounters:  03/22/17 161 lb 12.8 oz (73.4 kg)  10/25/16 170 lb 12.8 oz (77.5 kg)  08/30/16 175 lb (79.4 kg)   BP Readings from Last 3 Encounters:  03/22/17 121/84  10/25/16 108/72  08/30/16 132/72   Pulse Readings from Last 3 Encounters:  03/22/17 97  10/25/16 (!) 109  08/30/16 91   BMI Readings from Last 3 Encounters:  03/22/17 27.99 kg/m  10/25/16 29.55 kg/m  08/30/16 30.27 kg/m     Patient Care Team    Relationship Specialty Notifications Start End  Mellody Dance, DO PCP - General Family Medicine  05/08/16   Chucky May, [place] Physician Psychiatry  08/29/16   Delsa Bern, MD Consulting Physician Obstetrics and Gynecology  03/22/17      Patient Active Problem List   Diagnosis Date Noted  . History of chlamydia 03/22/2017  . Muscle strain 03/22/2017  . Lumbar spine strain, initial encounter 03/22/2017  . Encounter for wellness examination 10/25/2016  . Panic attacks 06/09/2016  . Status post hysterectomy, left ovaries, took cervix. 06/09/2016  . Vitamin D deficiency 06/09/2016  . Adult hypothyroidism 12/19/2015  . Obesity (BMI 30-39.9) 11/03/2013  . Fibromyalgia 12/25/2012  . h/o Blood glucose elevated 12/25/2012  . h/o Hypertriglyceridemia 12/25/2012  . Cannot sleep 12/25/2012  . Anxiety 03/14/2012  . Migraines 03/14/2012  . Depression 03/14/2012  . Chronic fatigue disorder 03/14/2012    Past Medical history, Surgical history, Family history, Social history, Allergies and Medications have been entered into the medical record, reviewed and changed as needed.    Current Meds  Medication Sig  . ALPRAZolam (XANAX) 0.5 MG tablet Take 1-2 tablets by mouth every 6 (six) hours as  needed.  Marland Kitchen amphetamine-dextroamphetamine (ADDERALL) 20 MG tablet Take 1 tablet by mouth 3 (three) times daily.  . B Complex Vitamins (VITAMIN-B COMPLEX) TABS Take 1 tablet by mouth daily.  . QUEtiapine (SEROQUEL) 100 MG tablet Take 100 mg by mouth as needed.  . traZODone (DESYREL) 100 MG tablet Take 2 tablets by mouth at bedtime.  Marland Kitchen venlafaxine XR (EFFEXOR-XR) 75 MG 24 hr capsule Take 3 capsules by mouth every morning.  . Vitamin D, Ergocalciferol, (DRISDOL) 50000 units CAPS capsule Take 1 capsule (50,000 Units total) by mouth every 7 (seven) days.  . [DISCONTINUED] levothyroxine (SYNTHROID, LEVOTHROID) 125 MCG tablet TAKE 1 TABLET (125 MCG TOTAL) BY MOUTH DAILY.  . [DISCONTINUED] niacin (NIASPAN) 1000 MG CR tablet Take 1 tablet (1,000 mg total) by mouth at bedtime.    Allergies:  Allergies  Allergen Reactions  .  Naltrexone Other (See Comments)    Panic attack     Review of Systems: General:   Denies fever, chills, unexplained weight loss.  Optho/Auditory:   Denies visual changes, blurred vision/LOV Respiratory:   Denies wheeze, DOE more than baseline levels.  Cardiovascular:   Denies chest pain, palpitations, new onset peripheral edema  Gastrointestinal:   Denies nausea, vomiting, diarrhea, abd pain.  Genitourinary: Denies dysuria, freq/ urgency, flank pain or discharge from genitals.  Endocrine:     Denies hot or cold intolerance, polyuria, polydipsia. Musculoskeletal:   Denies unexplained myalgias, joint swelling, unexplained arthralgias, gait problems.  Skin:  Denies new onset rash, suspicious lesions Neurological:     Denies dizziness, unexplained weakness, numbness  Psychiatric/Behavioral:   Denies mood changes, suicidal or homicidal ideations, hallucinations    Objective:   Blood pressure 121/84, pulse 97, height 5' 3.75" (1.619 m), weight 161 lb 12.8 oz (73.4 kg). Body mass index is 27.99 kg/m. General:  Well Developed, well nourished, appropriate for stated age.    Neuro:  Alert and oriented,  extra-ocular muscles intact  HEENT:  Normocephalic, atraumatic, neck supple, no carotid bruits appreciated  Skin:  no gross rash, warm, pink. Cardiac:  RRR, S1 S2 Respiratory:  ECTA B/L and A/P, Not using accessory muscles, speaking in full sentences- unlabored. M-SK;  + lumbar muscle spasms L 2-4 on L >er R, no boney TTP Back:  B/l paravert muscle tightness B/L  R>er L, no boney TTP, FROM w/o diff, NVI distally, no SLR, no piriformis signs Vascular:  Ext warm, no cyanosis apprec.; cap RF less 2 sec. Psych:  No HI/SI, judgement and insight good, Euthymic mood. Full Affect.

## 2017-03-23 LAB — TSH: TSH: 0.993 u[IU]/mL (ref 0.450–4.500)

## 2017-03-23 LAB — LIPID PANEL
Chol/HDL Ratio: 2.8 ratio (ref 0.0–4.4)
Cholesterol, Total: 193 mg/dL (ref 100–199)
HDL: 70 mg/dL (ref >39)
LDL Calculated: 94 mg/dL (ref 0–99)
Triglycerides: 144 mg/dL (ref 0–149)
VLDL Cholesterol Cal: 29 mg/dL (ref 5–40)

## 2017-03-23 LAB — T4, FREE: Free T4: 1.38 ng/dL (ref 0.82–1.77)

## 2017-03-23 LAB — VITAMIN D 25 HYDROXY (VIT D DEFICIENCY, FRACTURES): Vit D, 25-Hydroxy: 107 ng/mL — ABNORMAL HIGH (ref 30.0–100.0)

## 2017-03-24 ENCOUNTER — Other Ambulatory Visit: Payer: Self-pay | Admitting: Family Medicine

## 2017-03-25 LAB — CULTURE, URINE COMPREHENSIVE

## 2017-03-26 LAB — GC/CHLAMYDIA PROBE AMP
Chlamydia trachomatis, NAA: NEGATIVE
Neisseria gonorrhoeae by PCR: NEGATIVE

## 2017-04-03 ENCOUNTER — Telehealth: Payer: Self-pay | Admitting: Family Medicine

## 2017-04-03 NOTE — Telephone Encounter (Signed)
Any other recommendations for patient.

## 2017-04-03 NOTE — Telephone Encounter (Signed)
Besides the flexeril and ibuprofen, I rec P.T. as we discussed before.  This is the best next step to help her with her sx.      She is on the max dose flexeril, so remind her to take as prescribed and remind her this is not a chornic med-> just for short term use.

## 2017-04-03 NOTE — Telephone Encounter (Signed)
Patient called states she doesn't believe the medication/ cycobenzaprine 10MG  (takes 3 times daily) is working-- ask that provider increase Dosage perhaps to make it work a little better--forwarding request to nurse--call pt if questions or to discuss. --glh

## 2017-04-04 NOTE — Telephone Encounter (Signed)
Left message informing patient we can refer to physical therapy.  Advised patient to take flexeril as directed and she can take ibuprofen as well.  Patient will call back if she wants to pursue physical therapy.

## 2017-04-05 ENCOUNTER — Ambulatory Visit: Payer: BC Managed Care – PPO | Admitting: Family Medicine

## 2017-04-13 ENCOUNTER — Other Ambulatory Visit: Payer: Self-pay | Admitting: Family Medicine

## 2017-04-30 ENCOUNTER — Telehealth: Payer: Self-pay | Admitting: Family Medicine

## 2017-04-30 NOTE — Telephone Encounter (Signed)
Informed patient of lab results

## 2017-04-30 NOTE — Telephone Encounter (Signed)
Pt called states she never got her lab results

## 2017-05-22 DIAGNOSIS — M5441 Lumbago with sciatica, right side: Secondary | ICD-10-CM

## 2017-05-22 DIAGNOSIS — G8929 Other chronic pain: Secondary | ICD-10-CM | POA: Insufficient documentation

## 2017-06-20 ENCOUNTER — Ambulatory Visit: Payer: Medicare Other | Attending: Adult Health | Admitting: Physical Therapy

## 2017-06-20 ENCOUNTER — Encounter: Payer: Self-pay | Admitting: Physical Therapy

## 2017-06-20 DIAGNOSIS — M6281 Muscle weakness (generalized): Secondary | ICD-10-CM

## 2017-06-20 DIAGNOSIS — G8929 Other chronic pain: Secondary | ICD-10-CM

## 2017-06-20 DIAGNOSIS — M5441 Lumbago with sciatica, right side: Secondary | ICD-10-CM | POA: Insufficient documentation

## 2017-06-20 NOTE — Patient Instructions (Addendum)
    Back Hyperextension: Using Arms    Lying face down with arms bent, inhale. Then while exhaling, straighten arms. Hold __2__ seconds. Slowly return to starting position. Repeat _10___ times per set. Do _2___ sets per session. Do __5__ sessions per day. AVOID PAINFUL RANGE OF MOTION- if hurting, don't push up as high;   Copyright  VHI. All rights reserved.  HIP: Extension / KNEE: Flexion - Prone    Lying on stomach, alternate bending knee to point of comfort- avoid painful range of motion; . _10 reps per set, _2__ sets per day, _5__ days per week   Copyright  VHI. All rights reserved.

## 2017-06-20 NOTE — Therapy (Addendum)
[place] [facility] [address] [place], [place], [address] Phone: [phone]   Fax:  [phone]  Physical Therapy Evaluation  Patient Details  Name: Ariel King MRN: [id number] Date of Birth: 1971/04/08 Referring Provider: Delfino Lovett  Encounter Date: 06/20/2017      PT End of Session - 06/20/17 1214    Visit Number 1   Number of Visits 17   Date for PT Re-Evaluation 08/15/17   Authorization Type BC[employer]   PT Start Time 1103   PT Stop Time 1207   PT Time Calculation (min) 64 min   Activity Tolerance Patient tolerated treatment well;Patient limited by pain   Behavior During Therapy [facility] for tasks assessed/performed;Anxious      Past Medical History:  Diagnosis Date  . Abnormal Pap smear   . Adult hypothyroidism 12/19/2015  . Anemia   . Anxiety   . Bladder disorder   . Chronic fatigue and immune dysfunction syndrome ([place])   . Chronic kidney disease   . Depression   . Depression   . EBV infection   . H/O bladder infections   . H/O joint problems   . Hx of migraines   . Incontinence   . Thyroid disease     Past Surgical History:  Procedure Laterality Date  . ROBOTIC ASSISTED LAP VAGINAL HYSTERECTOMY    . SYNOVECTOMY WRIST      There were no vitals filed for this visit.       Subjective Assessment - 06/20/17 1113    Subjective Pt presents to therapy with increased back pain for months now; Pt states the frustration of not being able to do the things she can especially working out;    Pertinent History 46 yo Female referred for back pain with RLE radiculopathy; MRI shows mild spondylosis of lumbar spine; Pt reports back pain starting 4 months ago; Pt afterwards starting shooting pain down leg and numbness in foot; Pt then reported numbness down arm and hand; Pt denies that working out was when the pain started; Pt was working out 6 x week and now is not working out; Pt reports N/T just sitting down; Pt reports  trouble finding position to fall asleep at night but pain does not wake her at night; Pt reports pain is the same starting in the morning and continues all day long; Doctor in may stated pain was from spasms and was given muscle relaxer;  Pt was also given exercises that she has not completed; Pt reports doing some stretches but with little relief; Pt reports pain brings on migraines;    Limitations Sitting;Standing;Walking;Lifting   How long can you sit comfortably? <30 min   How long can you stand comfortably? <30 min; will stand with weight through L LE   How long can you walk comfortably? 300-500 feet   Diagnostic tests Lumbar MRI (05/2017): Mild spondylosis of the lumbar spine, as above. Mild to moderate right-sided subarticular zone and foraminal narrowing at L4/5 due to disc osteophyte and facet degenerative changes.   Patient Stated Goals pain free and return gym    Currently in Pain? Yes   Pain Score 9    Pain Location Back   Pain Orientation Right;Lower   Pain Descriptors / Indicators Sharp;Shooting;Constant;Numbness   Pain Type Chronic pain   Pain Radiating Towards R LE    Pain Onset More than a month ago   Pain Frequency Constant   Aggravating Factors  sitting, standing, walking, lifting  Pain Relieving Factors gabapentin has helped with numbness; prednisone; baths with essential oils    Effect of Pain on Daily Activities decreased activity level    Multiple Pain Sites Yes   Pain Score 7   Pain Location Head   Pain Orientation Anterior   Pain Descriptors / Indicators Tender   Pain Type Chronic pain   Pain Frequency Intermittent   Aggravating Factors  back pain   Pain Relieving Factors medication    Effect of Pain on Daily Activities decreased activity tolerance            [facility] PT Assessment - 06/20/17 0001      Assessment   Medical Diagnosis Low back pain   Referring Provider Delfino Lovett   Onset Date/Surgical Date 03/13/17   Hand Dominance Right   Next MD Visit  NP- 6 weeks; Hermelinda Dellen 8/9   Prior Therapy 12 years ago for back- resolved didnt like therapy      Precautions   Precautions None     Restrictions   Weight Bearing Restrictions No     Balance Screen   Has the patient fallen in the past 6 months Yes   How many times? 1  hit head    Has the patient had a decrease in activity level because of a fear of falling?  Yes   Is the patient reluctant to leave their home because of a fear of falling?  No     Home Social worker Private residence   Living Arrangements Other (Comment)   Available Help at Discharge Friend(s);Family   Type of Home Apartment   Home Access Stairs to enter   Entrance Stairs-Number of Steps 35   Entrance Stairs-Rails Can reach both   Home Layout One level   Home Equipment None     Prior Function   Level of Independence Independent   Vocation On disability   Leisure working out     Charity fundraiser Status Within Functional Limits for tasks assessed     Observation/Other Assessments   Observations Pelvis is level; PSIS level   Other Surveys  Other Surveys   Modified [place] 70% disabled (35pts)-extreme disability     Sensation   Light Touch Impaired by gross assessment  limited in RLE lower leg into foot; less than LLE;    Additional Comments Pt reports no sensation in R lower leg and foot     Coordination   Gross Motor Movements are Fluid and Coordinated Yes     Posture/Postural Control   Posture/Postural Control Postural limitations   Postural Limitations Weight shift left   Posture Comments Seated and standing pt unable to bear weight through right side; standing decreased lumbar lordosis; sitting exhibits overall slumped posture with decreased lumbar lordosis and forward flexed head;      AROM   Overall AROM Comments Repeated extension increase ROM and pain was central to back and not radiating   Lumbar Flexion 35   Lumbar Extension 5   Lumbar - Right  Side Bend 10   Lumbar - Left Side Bend 15     Strength   Overall Strength Comments All strength limited by back pain    Right Hip Flexion 4/5   Right Hip ABduction 5/5   Right Hip ADduction 4/5   Left Hip Flexion 4/5   Left Hip ABduction 5/5   Left Hip ADduction 4/5   Right Knee Flexion 4/5   Right Knee Extension 4/5  Left Knee Flexion 4/5   Left Knee Extension 4/5   Right Ankle Dorsiflexion 5/5   Right Ankle Plantar Flexion 5/5   Left Ankle Dorsiflexion 5/5   Left Ankle Plantar Flexion 5/5     Flexibility   Soft Tissue Assessment /Muscle Length --  Piriformis tightness bilat; Hamstring tightness on R     Palpation   SI assessment  Distraction at sacrum and on R iliac crest- improved patients symptoms    Palpation comment tender to palpation along PSIS and posterior iliac crests     Special Tests    Special Tests Sacrolliac Tests;Lumbar   Lumbar Tests Slump Test;Straight Leg Raise   Sacroiliac Tests  --     Slump test   Findings Positive   Comment positive bilat     Straight Leg Raise   Findings Positive   Comment bilat     Pelvic Dictraction   Findings Positive   Side  --  bilaterally;    Comment decreased back pain     Pelvic Compression   Findings Negative   Side --  bilaterally;     Transfers   Transfers Independent with all Transfers     Ambulation/Gait   Ambulation/Gait Yes   Ambulation/Gait Assistance 7: Independent   Gait Pattern Within Functional Limits   Gait velocity decreased gait speed because pain     High Level Balance   High Level Balance Comments Standing balance: normal static and dynamic; Sitting: normal static and dynamic; does have pain when reaching or moving outside base of support;             Objective measurements completed on examination: See above findings.    PT TREATMENT;  Instruct and have pt demonstrate HEP; Prone press ups on elbows x 10; to increase lumbar ROM and abolish radicular symptoms  Prone Knee  bends x 10 ea to activate hamstring musculature;  Patient required min-moderate verbal/tactile cues for correct exercise technique.               PT Education - 06/20/17 1213    Education provided Yes   Education Details HEP; Recommendations/Plan of care   Person(s) Educated Patient   Methods Explanation;Verbal cues   Comprehension Verbalized understanding;Verbal cues required;Returned demonstration          PT Short Term Goals - 06/20/17 1707      PT SHORT TERM GOAL #1   Title Pt will report no radicular symptoms in feet and lower leg over last 48 hours, in order to improve function and activity tolerance.   Time 4   Period Weeks   Status New   Target Date 07/18/17     PT SHORT TERM GOAL #2   Title Pt will increase lumbar ROM flexibility to 15 degrees of extension and lateral flexion and 50 degrees of flexion to help with daily activities of dressing, picking up groceries, etc.    Baseline flexion-35; extension-5; lateral flexion R-10 and L-15   Time 4   Period Weeks   Status New   Target Date 07/18/17     PT SHORT TERM GOAL #3   Title Pt will report no pain with 15 min of standing, walking, or sitting in order to cook in kitchen, go to store, and eat a meal;   Baseline constant   Time 4   Period Weeks   Status New   Target Date 07/18/17           PT Long Term Goals -  06/20/17 1702      PT LONG TERM GOAL #1   Title Pt will be independent with HEP in order to decrease pain and improve function in daily activities   Baseline --   Time 8   Period Weeks   Status New   Target Date 08/15/17     PT LONG TERM GOAL #2   Title Pt will perform a squat with good technique and form, as well as no pain, in order to return to prior level of function with exercise program.    Baseline no exercise   Time 8   Period Weeks   Status New   Target Date 08/15/17     PT LONG TERM GOAL #3   Title Pt will increase LE hip and knee strength to 5/5 bilat to improve  mobility and activity tolerance   Baseline 4/5   Time 8   Period Weeks   Status New   Target Date 08/15/17     PT LONG TERM GOAL #4   Title Pt will increase gait speed to 1.49m/s with no pain for crossing street safely and other community ambulation   Time 8   Period Weeks   Status New   Target Date 08/15/17     PT LONG TERM GOAL #5   Title Pt will decrease modified oswestry score by 12.5 points (22.5-moderate disabled) to show significant improvement in back pain disability   Baseline 35- 70% disabled   Time 8   Period Weeks   Status New   Target Date 08/15/17                Plan - 06/20/17 1218    Clinical Impression Statement Pt is 46 y.o woman that present to therapy with low back pain and radicular symptoms; pt presents to therapy with anxiousness and depression over the length of time of back pain and its effect on activity level; Pt today was limited in activities because of pain; Pt demonstrated decrease ROM of lumbar spine; decreased strength in some of the LE hip and knee musculature; Pt shows a decreased gait when entering therapy because of pain and her posture tends to favor the Left side to relieve pain on right; Pt had a positive bilat slump test with radiating symptoms into both legs; Pt also exhibits positive bilat SLR; When assessing SIJ patient reported symptom relief in low back with decompression; Pt also exhibits tightness in piriformis as well as R hamstring; All these findings contribute to her low back pain with radiating symptoms; Pt tightness, and compression can be causing iritation to the nerves leading to the radicular symptoms; Pt would benefit from skilled PT in order to decrease pain, increase strength, and improve mobility;    History and Personal Factors relevant to plan of care: depression; anxiety; amount of stairs at apartment (#35); obese;    Clinical Presentation Evolving   Clinical Presentation due to: radiating constant pain, chronic pain,  that has little alleviating factors   Clinical Decision Making Moderate   Rehab Potential Fair   Clinical Impairments Affecting Rehab Potential good; motivated, age, support at home (negative: psychosocial, constant pain, prior neg thoughts of therapy, obese)    PT Frequency 2x / week   PT Duration 8 weeks   PT Treatment/Interventions ADLs/Self Care Home Management;Aquatic Therapy;Cryotherapy;Electrical Stimulation;Moist Heat;Traction;Ultrasound;Gait training;Stair training;Functional mobility training;Therapeutic activities;Therapeutic exercise;Neuromuscular re-education;Patient/family education;Manual techniques;Passive range of motion;Dry needling   PT Next Visit Plan transverse abdominus MMT; multifidi MMT; asses spine mobility; educated on  sitting posture(not crossing legs); distraction in prone;    PT Home Exercise Plan see pt instructions   Consulted and Agree with Plan of Care Patient      Patient will benefit from skilled therapeutic intervention in order to improve the following deficits and impairments:  Impaired sensation, Improper body mechanics, Pain, Postural dysfunction, Decreased mobility, Decreased activity tolerance, Decreased endurance, Decreased range of motion, Decreased strength, Difficulty walking, Impaired flexibility, Obesity  Visit Diagnosis: Chronic bilateral low back pain with right-sided sciatica - Plan: PT plan of care cert/re-cert  Muscle weakness (generalized) - Plan: PT plan of care cert/re-cert        G-Codes - 06-27-2017 1141    Functional Assessment Tool Used (Outpatient Only) Oswestry, clinical judgement, Lumbar ROM/strength   Functional Limitation Changing and maintaining body position   Changing and Maintaining Body Position Current Status (I3704) At least 40 percent but less than 60 percent impaired, limited or restricted   Changing and Maintaining Body Position Goal Status (U8891) At least 20 percent but less than 40 percent impaired, limited or  restricted        Problem List Patient Active Problem List   Diagnosis Date Noted  . History of chlamydia 03/22/2017  . Muscle strain 03/22/2017  . Lumbar spine strain, initial encounter 03/22/2017  . Encounter for wellness examination 10/25/2016  . Panic attacks 06/09/2016  . Status post hysterectomy, left ovaries, took cervix. 06/09/2016  . Vitamin D deficiency 06/09/2016  . Adult hypothyroidism 12/19/2015  . Obesity (BMI 30-39.9) 11/03/2013  . Fibromyalgia 12/25/2012  . h/o Blood glucose elevated 12/25/2012  . h/o Hypertriglyceridemia 12/25/2012  . Cannot sleep 12/25/2012  . Anxiety 03/14/2012  . Migraines 03/14/2012  . Depression 03/14/2012  . Chronic fatigue disorder 03/14/2012   Doreene Nest SPT This entire session was performed under direct supervision and direction of a licensed therapist/therapist assistant . I have personally read, edited and approve of the note as written.  Trotter,Margaret PT, DPT 27-Jun-2017, 5:53 PM  [place] [facility] [address] [place], [place], [address] Phone: [phone]   Fax:  [phone]  Name: Ariel King MRN: [id number] Date of Birth: [birth year]

## 2017-06-26 ENCOUNTER — Ambulatory Visit: Payer: Medicare Other | Admitting: Physical Therapy

## 2017-06-26 ENCOUNTER — Encounter: Payer: Self-pay | Admitting: Physical Therapy

## 2017-06-26 ENCOUNTER — Telehealth: Payer: Self-pay | Admitting: Family Medicine

## 2017-06-26 DIAGNOSIS — M5441 Lumbago with sciatica, right side: Principal | ICD-10-CM

## 2017-06-26 DIAGNOSIS — M6281 Muscle weakness (generalized): Secondary | ICD-10-CM

## 2017-06-26 DIAGNOSIS — G8929 Other chronic pain: Secondary | ICD-10-CM

## 2017-06-26 NOTE — Telephone Encounter (Signed)
Elberta Fortis called on behalf of the patient because they faxed a prescription request over for a brace on 06/22/17 and have not heard anything back. If You can call him back at [phone] to let him know that it was received and processed that would be great. Thanks

## 2017-06-26 NOTE — Telephone Encounter (Signed)
Called and spoke to patient and notified that she needs to get this done by the doctor treating her for her back.  MPulliam, CMA/RT(R)

## 2017-06-26 NOTE — Therapy (Signed)
[place] MAIN [facility] SERVICES [address] [place], [place], [address] Phone: [phone]   Fax:  [phone]  Physical Therapy Treatment  Patient Details  Name: Ariel King MRN: [id number] Date of Birth: 09-19-1971 Referring Provider: Delfino Lovett  Encounter Date: 06/26/2017      PT End of Session - 06/26/17 1725    Visit Number 2   Number of Visits 17   Date for PT Re-Evaluation 08/15/17   Authorization Type BCBS   PT Start Time 1730   PT Stop Time 1815   PT Time Calculation (min) 45 min   Activity Tolerance Patient tolerated treatment well;Patient limited by pain   Behavior During Therapy [facility] for tasks assessed/performed;Anxious      Past Medical History:  Diagnosis Date  . Abnormal Pap smear   . Adult hypothyroidism 12/19/2015  . Anemia   . Anxiety   . Bladder disorder   . Chronic fatigue and immune dysfunction syndrome ([place])   . Chronic kidney disease   . Depression   . Depression   . EBV infection   . H/O bladder infections   . H/O joint problems   . Hx of migraines   . Incontinence   . Thyroid disease     Past Surgical History:  Procedure Laterality Date  . ROBOTIC ASSISTED LAP VAGINAL HYSTERECTOMY    . SYNOVECTOMY WRIST      There were no vitals filed for this visit.      Subjective Assessment - 06/26/17 1724    Subjective Pt went to doctor and will get steroid injection and asked about TENS; Pt states she has been walking in the pool every day    Pertinent History 46 yo Female referred for back pain with RLE radiculopathy; MRI shows mild spondylosis of lumbar spine; Pt reports back pain starting 4 months ago; Pt afterwards starting shooting pain down leg and numbness in foot; Pt then reported numbness down arm and hand; Pt denies that working out was when the pain started; Pt was working out 6 x week and now is not working out; Pt reports N/T just sitting down; Pt reports trouble finding position to fall  asleep at night but pain does not wake her at night; Pt reports pain is the same starting in the morning and continues all day long; Doctor in may stated pain was from spasms and was given muscle relaxer;  Pt was also given exercises that she has not completed; Pt reports doing some stretches but with little relief; Pt reports pain brings on migraines;    Limitations Sitting;Standing;Walking;Lifting   How long can you sit comfortably? <30 min   How long can you stand comfortably? <30 min; will stand with weight through L LE   How long can you walk comfortably? 300-500 feet   Diagnostic tests Lumbar MRI (05/2017): Mild spondylosis of the lumbar spine, as above. Mild to moderate right-sided subarticular zone and foraminal narrowing at L4/5 due to disc osteophyte and facet degenerative changes.   Patient Stated Goals pain free and return gym    Currently in Pain? Yes   Pain Score 7    Pain Location Back   Pain Orientation Right;Left;Lower   Pain Descriptors / Indicators Sharp;Shooting;Constant   Pain Type Chronic pain   Pain Radiating Towards hip and RLE   Pain Onset More than a month ago   Pain Frequency Constant   Multiple Pain Sites Yes   Pain Score 8   Pain Location  Head   Pain Orientation Anterior   Pain Descriptors / Indicators Tender   Pain Type Chronic pain   Pain Onset More than a month ago   Pain Frequency Intermittent     PT TREATMENT;  supine;  Pt unable to perform and hold sit up because weak transverse abdominus 3/5 MMT  Piriformis stretch x 30 ea   HS stretch 2 x 30 sec sec L; R unable to stretch  Pt could not tolerate stretches for long because increase in back pain and did not allow pt to get in correct position to perform stretch;   Prone;  assess passive accessory movement of spine mid thoracic to sacrum; Pt is tender in Lumbar spine which made it difficult to assess mobility; mild hypomobility in lower thoracic spine; Pt demonstrated muscle guarding throughout  passive accessory motions.   assess soft tissue; Pt had tenderness and tightness in L and R erector spinae muscles; quadratus lumborum did not appear tight or tender;    distraction in prone;  Inferior Distraction on pelvis R/L 2 x 30 sec; with relief of pressure in back;    PT applied TENS concurrent with heat applied to low back, prone position, modulation II setting  for 15 min at level 14 intensity; Patient tolerated well with less back pain; skin showed no redness or breakdown;    Educated pt throughout session on back pain and reassured pt that therapy can be successful and will help decrease some of her pain;                                  PT Education - 06/26/17 1724    Education provided Yes   Education Details stretching, tens;    Person(s) Educated Patient   Methods Explanation;Verbal cues   Comprehension Verbalized understanding;Returned demonstration;Verbal cues required          PT Short Term Goals - 06/20/17 1707      PT SHORT TERM GOAL #1   Title Pt will report no radicular symptoms in feet and lower leg over last 48 hours, in order to improve function and activity tolerance.   Time 4   Period Weeks   Status New   Target Date 07/18/17     PT SHORT TERM GOAL #2   Title Pt will increase lumbar ROM flexibility to 15 degrees of extension and lateral flexion and 50 degrees of flexion to help with daily activities of dressing, picking up groceries, etc.    Baseline flexion-35; extension-5; lateral flexion R-10 and L-15   Time 4   Period Weeks   Status New   Target Date 07/18/17     PT SHORT TERM GOAL #3   Title Pt will report no pain with 15 min of standing, walking, or sitting in order to cook in kitchen, go to store, and eat a meal;   Baseline constant   Time 4   Period Weeks   Status New   Target Date 07/18/17           PT Long Term Goals - 06/20/17 1702      PT LONG TERM GOAL #1   Title Pt will be independent with  HEP in order to decrease pain and improve function in daily activities   Baseline --   Time 8   Period Weeks   Status New   Target Date 08/15/17     PT LONG TERM GOAL #  2   Title Pt will perform a squat with good technique and form, as well as no pain, in order to return to prior level of function with exercise program.    Baseline no exercise   Time 8   Period Weeks   Status New   Target Date 08/15/17     PT LONG TERM GOAL #3   Title Pt will increase LE hip and knee strength to 5/5 bilat to improve mobility and activity tolerance   Baseline 4/5   Time 8   Period Weeks   Status New   Target Date 08/15/17     PT LONG TERM GOAL #4   Title Pt will increase gait speed to 1.56m/s with no pain for crossing street safely and other community ambulation   Time 8   Period Weeks   Status New   Target Date 08/15/17     PT LONG TERM GOAL #5   Title Pt will decrease modified oswestry score by 12.5 points (22.5-moderate disabled) to show significant improvement in back pain disability   Baseline 35- 70% disabled   Time 8   Period Weeks   Status New   Target Date 08/15/17               Plan - 06/26/17 1725    Clinical Impression Statement Pt presents to therapy today in about 7/10 pain in low back; PT assessed core abdominal strength and was unable to hold a trunk flexion position; Pt was instructed in LE stretching and had difficulty getting into positions because of low back pain, also had difficulty relaxing in the position because guarding; Pt spine is very tender in the lumbar region and unable to fully assess mobility, lower thoracic region tender as well and has mild hypomobilty; Pt paraspinals showed decrease extensibility and tender; Applied TENS unit to see if pain and muscle guarding will decrease, will assess efficiacy next visit; Pt will continue to benefit from skilled PT to improve strength, decrease pain, and increase mobility;    Rehab Potential Fair   Clinical  Impairments Affecting Rehab Potential good; motivated, age, support at home (negative: psychosocial, constant pain, prior neg thoughts of therapy, obese)    PT Frequency 2x / week   PT Duration 8 weeks   PT Treatment/Interventions ADLs/Self Care Home Management;Aquatic Therapy;Cryotherapy;Electrical Stimulation;Moist Heat;Traction;Ultrasound;Gait training;Stair training;Functional mobility training;Therapeutic activities;Therapeutic exercise;Neuromuscular re-education;Patient/family education;Manual techniques;Passive range of motion;Dry needling   PT Next Visit Plan transverse abdominus MMT; multifidi MMT; asses spine mobility; educated on sitting posture(not crossing legs); distraction in prone;    PT Home Exercise Plan see pt instructions   Consulted and Agree with Plan of Care Patient      Patient will benefit from skilled therapeutic intervention in order to improve the following deficits and impairments:  Impaired sensation, Improper body mechanics, Pain, Postural dysfunction, Decreased mobility, Decreased activity tolerance, Decreased endurance, Decreased range of motion, Decreased strength, Difficulty walking, Impaired flexibility, Obesity  Visit Diagnosis: Chronic bilateral low back pain with right-sided sciatica  Muscle weakness (generalized)     Problem List Patient Active Problem List   Diagnosis Date Noted  . History of chlamydia 03/22/2017  . Muscle strain 03/22/2017  . Lumbar spine strain, initial encounter 03/22/2017  . Encounter for wellness examination 10/25/2016  . Panic attacks 06/09/2016  . Status post hysterectomy, left ovaries, took cervix. 06/09/2016  . Vitamin D deficiency 06/09/2016  . Adult hypothyroidism 12/19/2015  . Obesity (BMI 30-39.9) 11/03/2013  . Fibromyalgia 12/25/2012  . h/o  Blood glucose elevated 12/25/2012  . h/o Hypertriglyceridemia 12/25/2012  . Cannot sleep 12/25/2012  . Anxiety 03/14/2012  . Migraines 03/14/2012  . Depression 03/14/2012   . Chronic fatigue disorder 03/14/2012   Doreene Nest SPT This entire session was performed under direct supervision and direction of a licensed therapist/therapist assistant . I have personally read, edited and approve of the note as written.  Trotter,Margaret PT, DPT 06/27/2017, 8:53 AM   [place] MAIN [facility] SERVICES [address] [place], [place], [address] Phone: [phone]   Fax:  [phone]  Name: Ariel King MRN: [id number] Date of Birth: 04/11/1971

## 2017-06-28 ENCOUNTER — Ambulatory Visit: Payer: Medicare Other | Admitting: Physical Therapy

## 2017-07-03 ENCOUNTER — Ambulatory Visit: Payer: Medicare Other | Admitting: Physical Therapy

## 2017-07-05 ENCOUNTER — Ambulatory Visit: Payer: Medicare Other | Admitting: Physical Therapy

## 2017-07-05 ENCOUNTER — Encounter: Payer: Self-pay | Admitting: Physical Therapy

## 2017-07-05 DIAGNOSIS — M5441 Lumbago with sciatica, right side: Secondary | ICD-10-CM | POA: Diagnosis not present

## 2017-07-05 DIAGNOSIS — M6281 Muscle weakness (generalized): Secondary | ICD-10-CM

## 2017-07-05 DIAGNOSIS — G8929 Other chronic pain: Secondary | ICD-10-CM

## 2017-07-05 NOTE — Therapy (Signed)
[place] MAIN [facility] SERVICES [address] [place], [place], [address] Phone: [phone]   Fax:  [phone]  Physical Therapy Treatment  Patient Details  Name: Ariel King MRN: [id number] Date of Birth: October 28, 1971 Referring Provider: Delfino Lovett  Encounter Date: 07/05/2017      PT End of Session - 07/05/17 0817    Visit Number 3   Number of Visits 17   Date for PT Re-Evaluation 08/15/17   Authorization Type BCBS   PT Start Time 0805   PT Stop Time 0845   PT Time Calculation (min) 40 min   Activity Tolerance Patient tolerated treatment well;Patient limited by pain   Behavior During Therapy [facility] for tasks assessed/performed;Anxious      Past Medical History:  Diagnosis Date  . Abnormal Pap smear   . Adult hypothyroidism 12/19/2015  . Anemia   . Anxiety   . Bladder disorder   . Chronic fatigue and immune dysfunction syndrome ([place])   . Chronic kidney disease   . Depression   . Depression   . EBV infection   . H/O bladder infections   . H/O joint problems   . Hx of migraines   . Incontinence   . Thyroid disease     Past Surgical History:  Procedure Laterality Date  . ROBOTIC ASSISTED LAP VAGINAL HYSTERECTOMY    . SYNOVECTOMY WRIST      There were no vitals filed for this visit.      Subjective Assessment - 07/05/17 0805    Subjective Pt received the injection last tuesday and has been feeling better and has had decreased symptoms down the leg; Pt has follow-up next tuesday for injection   Pertinent History 46 yo Female referred for back pain with RLE radiculopathy; MRI shows mild spondylosis of lumbar spine; Pt reports back pain starting 4 months ago; Pt afterwards starting shooting pain down leg and numbness in foot; Pt then reported numbness down arm and hand; Pt denies that working out was when the pain started; Pt was working out 6 x week and now is not working out; Pt reports N/T just sitting down; Pt reports  trouble finding position to fall asleep at night but pain does not wake her at night; Pt reports pain is the same starting in the morning and continues all day long; Doctor in may stated pain was from spasms and was given muscle relaxer;  Pt was also given exercises that she has not completed; Pt reports doing some stretches but with little relief; Pt reports pain brings on migraines;    Limitations Sitting;Standing;Walking;Lifting   How long can you sit comfortably? <30 min   How long can you stand comfortably? <30 min; will stand with weight through L LE   How long can you walk comfortably? 300-500 feet   Diagnostic tests Lumbar MRI (05/2017): Mild spondylosis of the lumbar spine, as above. Mild to moderate right-sided subarticular zone and foraminal narrowing at L4/5 due to disc osteophyte and facet degenerative changes.   Patient Stated Goals pain free and return gym    Currently in Pain? Yes   Pain Score 7    Pain Location Back   Pain Orientation Lower   Pain Descriptors / Indicators Sharp;Constant   Pain Type Chronic pain   Pain Radiating Towards R hip   Pain Onset More than a month ago   Pain Frequency Intermittent   Aggravating Factors  movement    Pain Relieving Factors injection;  Effect of Pain on Daily Activities decreased activity   Multiple Pain Sites No   Pain Onset More than a month ago     PT TREATMENT;    PT applied TENS concurrent with heat applied to low back, prone position, modulation II setting  for 15 min at level 16 intensity; Patient tolerated well with less back pain; no adverse skin reaction;  PT applied heat to lower cervical and upper thoracic as well;    Central PA's to thoracic and lumbar spine (T7-L2) 2 x 20 sec ea; Grade II to thoracic and Grade I-II to Lumbar; Pt has increased stiffness towards lumbar spine;    Press ups with 2 deep breathes at top; painful on L Low back; Pt stopped exercise;   Posterior pelvic tilts; x 10 with 3 sec hold; Pt  reports that she feels a stretch and some pain but not shooting pain; As pt completes more reps pt reports less pain; Pt required tactile cues to rotate pelvis posteriorly;   Lumbar rotations with pball; added to HEP; 2 x 10 reps; Pt was able to increase stretch with more reps and had no pain with movement;   With pball under LE bring knee to chest; Pt can get to about 90 degree hip flexion before pain; x 10;                               PT Education - 07/05/17 0817    Education provided Yes   Education Details TENS; joint mobilizations; stretching   Person(s) Educated Patient   Methods Explanation;Verbal cues;Tactile cues   Comprehension Verbalized understanding;Returned demonstration;Verbal cues required          PT Short Term Goals - 06/20/17 1707      PT SHORT TERM GOAL #1   Title Pt will report no radicular symptoms in feet and lower leg over last 48 hours, in order to improve function and activity tolerance.   Time 4   Period Weeks   Status New   Target Date 07/18/17     PT SHORT TERM GOAL #2   Title Pt will increase lumbar ROM flexibility to 15 degrees of extension and lateral flexion and 50 degrees of flexion to help with daily activities of dressing, picking up groceries, etc.    Baseline flexion-35; extension-5; lateral flexion R-10 and L-15   Time 4   Period Weeks   Status New   Target Date 07/18/17     PT SHORT TERM GOAL #3   Title Pt will report no pain with 15 min of standing, walking, or sitting in order to cook in kitchen, go to store, and eat a meal;   Baseline constant   Time 4   Period Weeks   Status New   Target Date 07/18/17           PT Long Term Goals - 06/20/17 1702      PT LONG TERM GOAL #1   Title Pt will be independent with HEP in order to decrease pain and improve function in daily activities   Baseline --   Time 8   Period Weeks   Status New   Target Date 08/15/17     PT LONG TERM GOAL #2   Title Pt  will perform a squat with good technique and form, as well as no pain, in order to return to prior level of function with exercise program.  Baseline no exercise   Time 8   Period Weeks   Status New   Target Date 08/15/17     PT LONG TERM GOAL #3   Title Pt will increase LE hip and knee strength to 5/5 bilat to improve mobility and activity tolerance   Baseline 4/5   Time 8   Period Weeks   Status New   Target Date 08/15/17     PT LONG TERM GOAL #4   Title Pt will increase gait speed to 1.56m/s with no pain for crossing street safely and other community ambulation   Time 8   Period Weeks   Status New   Target Date 08/15/17     PT LONG TERM GOAL #5   Title Pt will decrease modified oswestry score by 12.5 points (22.5-moderate disabled) to show significant improvement in back pain disability   Baseline 35- 70% disabled   Time 8   Period Weeks   Status New   Target Date 08/15/17               Plan - 07/05/17 0818    Clinical Impression Statement Pt presents to therapy with 7/10 pain in back but reports feeling more movement and in less pain overall since the injection; PT applied TENS and patient reports more relaxation and movement once removed; Pt seems to respond better to flexion movement than extension; Pt requires easing into painfree movements and as she does it improves her range and tolerance; Pt will continue to benefit from skilled PT in order to improve mobility, strength, and activity tolerance;    Rehab Potential Fair   Clinical Impairments Affecting Rehab Potential good; motivated, age, support at home (negative: psychosocial, constant pain, prior neg thoughts of therapy, obese)    PT Frequency 2x / week   PT Duration 8 weeks   PT Treatment/Interventions ADLs/Self Care Home Management;Aquatic Therapy;Cryotherapy;Electrical Stimulation;Moist Heat;Traction;Ultrasound;Gait training;Stair training;Functional mobility training;Therapeutic activities;Therapeutic  exercise;Neuromuscular re-education;Patient/family education;Manual techniques;Passive range of motion;Dry needling   PT Next Visit Plan transverse abdominus MMT; multifidi MMT; asses spine mobility; educated on sitting posture(not crossing legs); distraction in prone;    PT Home Exercise Plan see pt instructions   Consulted and Agree with Plan of Care Patient      Patient will benefit from skilled therapeutic intervention in order to improve the following deficits and impairments:  Impaired sensation, Improper body mechanics, Pain, Postural dysfunction, Decreased mobility, Decreased activity tolerance, Decreased endurance, Decreased range of motion, Decreased strength, Difficulty walking, Impaired flexibility, Obesity  Visit Diagnosis: Chronic bilateral low back pain with right-sided sciatica  Muscle weakness (generalized)     Problem List Patient Active Problem List   Diagnosis Date Noted  . History of chlamydia 03/22/2017  . Muscle strain 03/22/2017  . Lumbar spine strain, initial encounter 03/22/2017  . Encounter for wellness examination 10/25/2016  . Panic attacks 06/09/2016  . Status post hysterectomy, left ovaries, took cervix. 06/09/2016  . Vitamin D deficiency 06/09/2016  . Adult hypothyroidism 12/19/2015  . Obesity (BMI 30-39.9) 11/03/2013  . Fibromyalgia 12/25/2012  . h/o Blood glucose elevated 12/25/2012  . h/o Hypertriglyceridemia 12/25/2012  . Cannot sleep 12/25/2012  . Anxiety 03/14/2012  . Migraines 03/14/2012  . Depression 03/14/2012  . Chronic fatigue disorder 03/14/2012   Doreene Nest SPT This entire session was performed under direct supervision and direction of a licensed therapist/therapist assistant . I have personally read, edited and approve of the note as written.  Trotter,Margaret PT, DPT 07/05/2017, 11:36 AM  [place] [facility]IN [facility] SERVICES [address] [place], [place], [address] Phone: [phone]    Fax:  [phone]  Name: Ariel King MRN: [id number] Date of Birth: August 18, 1971

## 2017-07-05 NOTE — Patient Instructions (Addendum)
(  Home) Flexion: Pelvic Tilt    Lie with neck supported, knees bent, feet flat. Tighten and suck stomach in, pushing back down against surface. Do not push down with legs. Repeat ____ times per set. Do ____ sets per session. Do ____ sessions per week.  Copyright  VHI. All rights reserved.

## 2017-07-10 ENCOUNTER — Ambulatory Visit: Payer: Medicare Other | Admitting: Physical Therapy

## 2017-07-18 ENCOUNTER — Ambulatory Visit: Payer: Medicare Other | Admitting: Physical Therapy

## 2017-07-19 ENCOUNTER — Ambulatory Visit: Payer: Medicare Other | Attending: Adult Health | Admitting: Physical Therapy

## 2017-07-19 DIAGNOSIS — M6281 Muscle weakness (generalized): Secondary | ICD-10-CM | POA: Insufficient documentation

## 2017-07-19 DIAGNOSIS — G8929 Other chronic pain: Secondary | ICD-10-CM | POA: Insufficient documentation

## 2017-07-19 DIAGNOSIS — M5441 Lumbago with sciatica, right side: Secondary | ICD-10-CM | POA: Insufficient documentation

## 2017-07-22 ENCOUNTER — Other Ambulatory Visit: Payer: Self-pay | Admitting: Family Medicine

## 2017-07-23 ENCOUNTER — Telehealth: Payer: Self-pay | Admitting: Family Medicine

## 2017-07-23 ENCOUNTER — Other Ambulatory Visit: Payer: Self-pay

## 2017-07-23 DIAGNOSIS — E559 Vitamin D deficiency, unspecified: Secondary | ICD-10-CM

## 2017-07-23 MED ORDER — VITAMIN D (ERGOCALCIFEROL) 1.25 MG (50000 UNIT) PO CAPS: 50000.0000 [IU] | ORAL_CAPSULE | ORAL | 1 refills | Status: DC

## 2017-07-23 NOTE — Telephone Encounter (Signed)
Called left message for patient to call the office. MPulliam, CMA/RT(R)  

## 2017-07-23 NOTE — Telephone Encounter (Signed)
Called patient - Vitamin refill was called in to the pharmacy. MPulliam, CMA/RT(R)

## 2017-07-23 NOTE — Telephone Encounter (Signed)
Refill on Vitamin D sent to pharmacy

## 2017-07-23 NOTE — Telephone Encounter (Signed)
Patient called states Ariel King left message for her to call office. --Pt reachable at [phone]-- Message can be left on confidential voicemail per patient. --glh

## 2017-07-23 NOTE — Telephone Encounter (Signed)
Called patient left message that RX sent into pharmacy.  MPulliam, CMA/RT(R)

## 2017-07-23 NOTE — Telephone Encounter (Signed)
Patient wants to speak with someone about her Vit D refill request

## 2017-07-24 ENCOUNTER — Ambulatory Visit: Payer: Medicare Other | Admitting: Physical Therapy

## 2017-07-25 ENCOUNTER — Encounter: Payer: Self-pay | Admitting: Physical Therapy

## 2017-07-25 ENCOUNTER — Ambulatory Visit: Payer: Medicare Other | Admitting: Physical Therapy

## 2017-07-25 DIAGNOSIS — M5441 Lumbago with sciatica, right side: Principal | ICD-10-CM

## 2017-07-25 DIAGNOSIS — G8929 Other chronic pain: Secondary | ICD-10-CM | POA: Diagnosis present

## 2017-07-25 DIAGNOSIS — M6281 Muscle weakness (generalized): Secondary | ICD-10-CM

## 2017-07-25 NOTE — Patient Instructions (Addendum)
Hip Flexion - Supine    Lying on back, knees bent, with red band around both legs, LIFT ONE LEG AT A TIME, tightening your core muscles.  Repeat __10-15_ times. Do __2_ times per day.  Copyright  VHI. All rights reserved.  Hip Abduction / Adduction: with Knee Flexion (Supine)    With both knees bent and red band tied around both legs, push both knees apart like a butterfly and then slowly bring back to starting position;  Repeat _10-15___ times per set. Do __2__ sets per session. Do _2___ sessions per day.  http://orth.exer.us/683   Copyright  VHI. All rights reserved.  Abduction: Clam (Eccentric) - Side-Lying    Lie on side with knees bent. With red band around both legs, Lift top knee, keeping feet together. Keep trunk steady. Slowly lower for 3-5 seconds. 10___ reps per set, __2_ sets per day, _5__ days per week. Add ___ lbs when you achieve ___ repetitions.  http://ecce.exer.us/65   Copyright  VHI. All rights reserved.    ABDUCTION: Sitting - Exercise Ball: Resistance Band (Active)   Sit with feet flat. With band tied around both legs, Lift right leg slightly and, against resistance band, draw it out to side. Complete __2_ sets of __10_ repetitions. Perform _2__ sessions per day.  Copyright  VHI. All rights reserved.  FLEXION: Sitting - Resistance Band (Active)   Sit, both feet flat. Have band tied around both legs above knees, lift right knee toward ceiling.Repeat with other knee Complete _2__ sets of _10__ repetitions. Perform _2__ sessions per day.  http://gtsc.exer.us/21   Copyright  VHI. All rights reserved.  FLEXION: Sitting - Resistance Band (Active)   Sit with right foot flat. Have band tied around both feet, bend ankle, bringing toes toward head. Complete __2_ sets of __10_ repetitions. Perform _2__ sessions per day.   Pelvic Anterior / Posterior Tilt / Pelvic Rock    Stand with upper back and buttocks touching wall, feet ____ inches from  wall, knees relaxed. OR YOU CAN DO SITTING IN CHAIR A.Anterior tilt: Raise chest and arch back slightly. B.Posterior tilt: Contract abdominals and flatten back. Rock _10-15___ times per session. Do __2__ sessions per week. Progression: Perform pelvic tilt away from wall.  Copyright  VHI. All rights reserved.

## 2017-07-25 NOTE — Therapy (Signed)
[place] MAIN [facility] SERVICES [address] [place], [place], [address] Phone: [phone]   Fax:  [phone]  Physical Therapy Treatment/Progress Note  Patient Details  Name: Ariel King MRN: [id number] Date of Birth: Mar 06, 1971 Referring Provider: Delfino Lovett  Encounter Date: 07/25/2017      PT End of Session - 07/25/17 1404    Visit Number 4   Number of Visits 17   Date for PT Re-Evaluation 08/15/17   Authorization Type BCBS   PT Start Time 3875   PT Stop Time 1430   PT Time Calculation (min) 45 min   Activity Tolerance Patient tolerated treatment well;Patient limited by pain   Behavior During Therapy [facility] for tasks assessed/performed;Anxious      Past Medical History:  Diagnosis Date  . Abnormal Pap smear   . Adult hypothyroidism 12/19/2015  . Anemia   . Anxiety   . Bladder disorder   . Chronic fatigue and immune dysfunction syndrome ([place])   . Chronic kidney disease   . Depression   . Depression   . EBV infection   . H/O bladder infections   . H/O joint problems   . Hx of migraines   . Incontinence   . Thyroid disease     Past Surgical History:  Procedure Laterality Date  . ROBOTIC ASSISTED LAP VAGINAL HYSTERECTOMY    . SYNOVECTOMY WRIST      There were no vitals filed for this visit.      Subjective Assessment - 07/25/17 1402    Subjective Patient reports continued back pain with increased discomfort through thoracic and lumbar back; She reports that she is compliant with HEP at least 3x a week; She reports that when she does the exercise it does help with her pain;    Pertinent History 46 yo Female referred for back pain with RLE radiculopathy; MRI shows mild spondylosis of lumbar spine; Pt reports back pain starting 4 months ago; Pt afterwards starting shooting pain down leg and numbness in foot; Pt then reported numbness down arm and hand; Pt denies that working out was when the pain started; Pt was working  out 6 x week and now is not working out; Pt reports N/T just sitting down; Pt reports trouble finding position to fall asleep at night but pain does not wake her at night; Pt reports pain is the same starting in the morning and continues all day long; Doctor in may stated pain was from spasms and was given muscle relaxer;  Pt was also given exercises that she has not completed; Pt reports doing some stretches but with little relief; Pt reports pain brings on migraines;    Limitations Sitting;Standing;Walking;Lifting   How long can you sit comfortably? <30 min   How long can you stand comfortably? <30 min; will stand with weight through L LE   How long can you walk comfortably? 300-500 feet   Diagnostic tests Lumbar MRI (05/2017): Mild spondylosis of the lumbar spine, as above. Mild to moderate right-sided subarticular zone and foraminal narrowing at L4/5 due to disc osteophyte and facet degenerative changes.   Patient Stated Goals pain free and return gym    Currently in Pain? Yes   Pain Score 8    Pain Location Back   Pain Orientation Lower   Pain Descriptors / Indicators Shooting;Aching   Pain Type Chronic pain   Pain Onset More than a month ago   Pain Frequency Intermittent   Aggravating Factors  worse  with standing, lifting;    Pain Relieving Factors rest, heat, exercise   Effect of Pain on Daily Activities decreased activity tolerance;    Multiple Pain Sites No   Pain Onset More than a month ago            [facility] PT Assessment - 07/25/17 0001      Observation/Other Assessments   Modified [place] 74% disabled (37 raw score), indicating extreme disability     AROM   Lumbar Flexion 22   Lumbar Extension 2   Lumbar - Right Side Bend 10   Lumbar - Left Side Bend 15     Strength   Right Hip Flexion 4-/5   Right Hip ABduction 4+/5   Right Hip ADduction 4+/5   Left Hip Flexion 4/5   Left Hip ABduction 4+/5   Left Hip ADduction 4+/5   Right Knee Flexion 4/5   Right Knee  Extension 4/5   Left Knee Flexion 4/5   Left Knee Extension 4/5   Right Ankle Dorsiflexion 4/5   Left Ankle Dorsiflexion 4/5     Standardized Balance Assessment   10 Meter Walk 1.2 m/s without AD, demonstrates community ambulator speed;         TREATMENT: Sitting with moist heat to low back concurrent with filling out modified oswestry (x5 min unbilled);  Patient instructed in lumbar ROM/LE strength to assess progress towards goals, see above;   Hooklying with moist heat to low back: Lumbar trunk rotation x10 each direction with cues to avoid painful ROM; Single knee to chest 15 sec hold x2 bilaterally; Posterior pelvic rock x10 reps with cues to improve diaphragmatic breathing for better core abdominal contraction;  Advanced HEP with instruction in tband exercise for core and LE strengthening: Hooklying red tband alternate hip flexion march x10 bilaterally; Hooklying hip abduction/ER red tband x15 bilaterally; Patient required min-moderate verbal/tactile cues for correct exercise technique and to increase core abdominal stabilization with LE movement  Sidelying red tband clamshells x10 with cues for positioning for comfort and to isolate hip abductors for better strengthening;   Patient able to demonstrate good log roll technique but still reports pain with transitioning on bed; Instructed patient to do HEP in bed rather than on floor for better tolerance;  Patient educated on how to do similar hip strengthening exercise in sitting with hip flexion/abduction etc; Sitting red tband ankle DF x15 bilaterally with min VCs for band placement;  Patient also instructed in seated pelvic rocks x10 reps for better core abdominal stabilization; Patient instructed to avoid painful ROM but to isolate core abdominal activation with diaphragmatic breathing;     Advanced HEP- see patient instructions               PT Education - 07/25/17 1453    Education provided Yes    Education Details HEP advanced, progress towards goals, recommendations   Person(s) Educated Patient   Methods Explanation;Demonstration;Verbal cues   Comprehension Verbalized understanding;Returned demonstration;Verbal cues required;Need further instruction          PT Short Term Goals - 07/25/17 1404      PT SHORT TERM GOAL #1   Title Pt will report no radicular symptoms in feet and lower leg over last 48 hours, in order to improve function and activity tolerance.   Baseline not as much in feet, isolated to low back and hips;    Time 4   Period Weeks   Status Partially Met     PT SHORT TERM  GOAL #2   Title Pt will increase lumbar ROM flexibility to 15 degrees of extension and lateral flexion and 50 degrees of flexion to help with daily activities of dressing, picking up groceries, etc.    Baseline flexion-35; extension-5; lateral flexion R-10 and L-15   Time 4   Period Weeks   Status Not Met     PT SHORT TERM GOAL #3   Title Pt will report no pain with 15 min of standing, walking, or sitting in order to cook in kitchen, go to store, and eat a meal;   Baseline able to stand for about 10 min before increase in pain;    Time 4   Period Weeks   Status Partially Met           PT Long Term Goals - 07/25/17 1404      PT LONG TERM GOAL #1   Title Pt will be independent with HEP in order to decrease pain and improve function in daily activities   Time 8   Period Weeks   Status On-going     PT LONG TERM GOAL #2   Title Pt will perform a squat with good technique and form, as well as no pain, in order to return to prior level of function with exercise program.    Baseline not able to do at this time;    Time 8   Period Weeks   Status Not Met     PT LONG TERM GOAL #3   Title Pt will increase LE hip and knee strength to 5/5 bilat to improve mobility and activity tolerance   Baseline 4/5   Time 8   Period Weeks   Status New     PT LONG TERM GOAL #4   Title Pt will  increase gait speed to 1.27ms with no pain for crossing street safely and other community ambulation   Time 8   Period Weeks   Status New     PT LONG TERM GOAL #5   Title Pt will decrease modified oswestry score by 12.5 points (22.5-moderate disabled) to show significant improvement in back pain disability   Baseline 35- 70% disabled   Time 8   Period Weeks   Status Not Met               Plan - 07/25/17 1456    Clinical Impression Statement Patient reports continued low back pain which is worse with activity. She reports compliance with HEP a few days a week with some pain relief. Recommended patient do exercises daily to help reduce stiffness and improve core control. Instructed patient in strength, gait speed and lumbar ROM to address goals. Patient demonstrates no significant change. However, this could be related to missed appointments and lack of attendance to rehab. she is curently scheduling one appointment at a time to improve compliance. Patient is also seeking a home TENs unit for pain control. She would benefit from additional skilled PT intervention to improve strength, reduce back pain and improve lumbar ROM.    Rehab Potential Fair   Clinical Impairments Affecting Rehab Potential good; motivated, age, support at home (negative: psychosocial, constant pain, prior neg thoughts of therapy, obese)    PT Frequency 2x / week   PT Duration 8 weeks   PT Treatment/Interventions ADLs/Self Care Home Management;Aquatic Therapy;Cryotherapy;Electrical Stimulation;Moist Heat;Traction;Ultrasound;Gait training;Stair training;Functional mobility training;Therapeutic activities;Therapeutic exercise;Neuromuscular re-education;Patient/family education;Manual techniques;Passive range of motion;Dry needling   PT Next Visit Plan transverse abdominus MMT; multifidi MMT; asses spine  mobility; educated on sitting posture(not crossing legs); distraction in prone;    PT Home Exercise Plan see pt  instructions   Consulted and Agree with Plan of Care Patient      Patient will benefit from skilled therapeutic intervention in order to improve the following deficits and impairments:  Impaired sensation, Improper body mechanics, Pain, Postural dysfunction, Decreased mobility, Decreased activity tolerance, Decreased endurance, Decreased range of motion, Decreased strength, Difficulty walking, Impaired flexibility, Obesity  Visit Diagnosis: Chronic bilateral low back pain with right-sided sciatica  Muscle weakness (generalized)     Problem List Patient Active Problem List   Diagnosis Date Noted  . History of chlamydia 03/22/2017  . Muscle strain 03/22/2017  . Lumbar spine strain, initial encounter 03/22/2017  . Encounter for wellness examination 10/25/2016  . Panic attacks 06/09/2016  . Status post hysterectomy, left ovaries, took cervix. 06/09/2016  . Vitamin D deficiency 06/09/2016  . Adult hypothyroidism 12/19/2015  . Obesity (BMI 30-39.9) 11/03/2013  . Fibromyalgia 12/25/2012  . h/o Blood glucose elevated 12/25/2012  . h/o Hypertriglyceridemia 12/25/2012  . Cannot sleep 12/25/2012  . Anxiety 03/14/2012  . Migraines 03/14/2012  . Depression 03/14/2012  . Chronic fatigue disorder 03/14/2012    Trotter,Margaret PT, DPT 07/25/2017, 2:59 PM  [place] [employer] MAIN [facility] SERVICES [address] [place], [place], [address] Phone: [phone]   Fax:  [phone]  Name: Ariel King MRN: [id number] Date of Birth: Dec 17, 1970

## 2017-07-26 ENCOUNTER — Ambulatory Visit: Payer: Medicare Other | Admitting: Physical Therapy

## 2017-07-31 ENCOUNTER — Ambulatory Visit: Payer: Medicare Other | Admitting: Physical Therapy

## 2017-08-02 ENCOUNTER — Ambulatory Visit: Payer: Medicare Other | Admitting: Physical Therapy

## 2017-08-07 ENCOUNTER — Encounter: Payer: BC Managed Care – PPO | Admitting: Physical Therapy

## 2017-08-07 ENCOUNTER — Telehealth: Payer: Self-pay | Admitting: Family Medicine

## 2017-08-07 NOTE — Telephone Encounter (Signed)
Patient is requesting a refill of her levothyroxine sent to [facility] [place]. Phone number [phone].

## 2017-08-08 ENCOUNTER — Other Ambulatory Visit: Payer: Self-pay | Admitting: Family Medicine

## 2017-08-08 ENCOUNTER — Telehealth: Payer: Self-pay | Admitting: Family Medicine

## 2017-08-08 NOTE — Telephone Encounter (Signed)
[facility] got medication last filled in [place] on 07/12/2017 for 90 days.  Called the patient to find out why she needs refill earl, left message for patient to call the office back. MPulliam, CMA/RT(R)

## 2017-08-08 NOTE — Telephone Encounter (Signed)
Called patient left message to call back.  Please see other note. MPulliam, CMA/RT(R)

## 2017-08-08 NOTE — Telephone Encounter (Signed)
Patient cld states send Rx [facility] MG [facility] in [place], -- pt states spk w/ MA earlier today & was told to call back w/ informations----  If questions call [facility] at [phone]--- or call patient at [phone]. --glh

## 2017-08-08 NOTE — Telephone Encounter (Signed)
Please call the patient let her know that I'm refilling only first 60 days.  She needs to be seen in an office visit for review of her chronic conditions.  Last seen in May for acute problem.

## 2017-08-09 ENCOUNTER — Telehealth: Payer: Self-pay | Admitting: Family Medicine

## 2017-08-09 ENCOUNTER — Encounter: Payer: BC Managed Care – PPO | Admitting: Physical Therapy

## 2017-08-09 NOTE — Telephone Encounter (Signed)
Dr Raliegh Scarlet filled for 60 days, patient needs office visit.  Called patient to notify left message to call back.  MPulliam, CMA/RT(R)

## 2017-08-09 NOTE — Telephone Encounter (Signed)
I have spoken to the patient multiple times please see other notes.  MPulliam, CMA/RT(R)

## 2017-08-09 NOTE — Telephone Encounter (Signed)
Pt called wanted to speak w/ MA(unavailable MA w/patient)--advised pt MA will call her back --glh

## 2017-08-09 NOTE — Telephone Encounter (Signed)
Called the patient and informed her that she should not be out of the medication.  [facility] [place] [facility] picked up a 90 day supply on 07/12/2017 and that it was a stock bottle. Patient states that she only received 30 tablets from the pharmacy.  MPulliam, CMA/RT(R)

## 2017-08-16 NOTE — Telephone Encounter (Signed)
Patient is aware.  MPulliam, CMA/RT(R)

## 2017-08-17 ENCOUNTER — Telehealth: Payer: Self-pay | Admitting: Family Medicine

## 2017-08-29 ENCOUNTER — Ambulatory Visit: Payer: BC Managed Care – PPO | Admitting: Family Medicine

## 2017-08-31 ENCOUNTER — Ambulatory Visit: Payer: BC Managed Care – PPO | Admitting: Family Medicine

## 2017-09-18 ENCOUNTER — Other Ambulatory Visit: Payer: Self-pay | Admitting: Family Medicine

## 2017-10-02 ENCOUNTER — Telehealth: Payer: Self-pay | Admitting: Family Medicine

## 2017-10-02 ENCOUNTER — Other Ambulatory Visit: Payer: Self-pay | Admitting: Family Medicine

## 2017-10-02 NOTE — Telephone Encounter (Signed)
Provider refilled Rx for 30 dys only requires pPt to have OV before any others granted--left pt voicemail to call office to set up appointment.  --glh

## 2017-10-03 ENCOUNTER — Other Ambulatory Visit: Payer: Self-pay

## 2017-10-03 DIAGNOSIS — E039 Hypothyroidism, unspecified: Secondary | ICD-10-CM

## 2017-10-03 MED ORDER — LEVOTHYROXINE SODIUM 125 MCG PO TABS: 125.0000 ug | ORAL_TABLET | Freq: Every day | ORAL | 0 refills | Status: DC

## 2017-10-03 NOTE — Telephone Encounter (Signed)
Resent 30 day refill for levothyroxine to [facility] in [place] medication was sent to the wrong pharmacy.  Other pharmacy was called to cancel out the pervious refill. Patient needs office visit for further refills. MPulliam, CMA/RT(R)

## 2017-10-15 ENCOUNTER — Ambulatory Visit (INDEPENDENT_AMBULATORY_CARE_PROVIDER_SITE_OTHER): Payer: Medicare Other | Admitting: Family Medicine

## 2017-10-15 DIAGNOSIS — E559 Vitamin D deficiency, unspecified: Secondary | ICD-10-CM

## 2017-10-15 DIAGNOSIS — E781 Pure hyperglyceridemia: Secondary | ICD-10-CM

## 2017-10-15 DIAGNOSIS — E669 Obesity, unspecified: Secondary | ICD-10-CM

## 2017-10-15 DIAGNOSIS — E039 Hypothyroidism, unspecified: Secondary | ICD-10-CM | POA: Diagnosis not present

## 2017-10-15 MED ORDER — LEVOTHYROXINE SODIUM 125 MCG PO TABS: 125.0000 ug | ORAL_TABLET | Freq: Every day | ORAL | 1 refills | Status: DC

## 2017-10-15 MED ORDER — NIACIN ER (ANTIHYPERLIPIDEMIC) 1000 MG PO TBCR: 1000.0000 mg | EXTENDED_RELEASE_TABLET | Freq: Every day | ORAL | 1 refills | Status: DC

## 2017-10-15 NOTE — Patient Instructions (Signed)
-  Also we need to transition your brain into thinking more positively.  These tasks below are some things I want you to do every day 1)  write 3 new things that you are grateful for every day for 21 days  2)  exercise daily- walk for 15 minutes twice a day every day 3)  you are going to journal every day about one positive experience that you had 4)  meditate every day.  You can go on YouTube and look for 15-minute relaxation meditation or what ever.  But we need to make sure that you are in the moment and relaxing and deep breathing every day 5)  Write 1 positive email every day to praise someone in your life     - If you have insomnia or difficulty sleeping, this information is for you:  - Avoid caffeinated beverages after lunch,  no alcoholic beverages,  no eating within 2-3 hours of lying down,  avoid exposure to blue light before bed,  avoid daytime naps, and  needs to maintain a regular sleep schedule- go to sleep and wake up around the same time every night.   - Resolve concerns or worries before entering bedroom:  Discussed relaxation techniques with patient and to keep a journal to write down fears\ worries.  I suggested seeing a counselor for CBT.   - Recommend patient meditate or do deep breathing exercises to help relax.   Incorporate the use of white noise machines or listen to "sleep meditation music", or recordings of guided meditations for sleep from YouTube which are free, such as  "guided meditation for detachment from over thinking"  by Michael Sealey.     

## 2017-10-15 NOTE — Progress Notes (Signed)
Impression and Recommendations:    1. Vitamin D deficiency   2. Obesity (BMI 30-39.9)   3. Adult hypothyroidism   4. h/o Hypertriglyceridemia     Orders Placed This Encounter  Procedures  . T4, free  . TSH  . VITAMIN D 25 Hydroxy (Vit-D Deficiency, Fractures)  . Specimen Status    1. Vitamin D deficiency   2. Obesity (BMI 30-39.9)   3. Adult hypothyroidism   4. h/o Hypertriglyceridemia      Plan   Gross side effects, risk and benefits, and alternatives of medications and treatment plan in general discussed with patient.  Patient is aware that all medications have potential side effects and we are unable to predict every side effect or drug-drug interaction that King occur.   Patient will call with any questions prior to using medication if they have concerns.  Expresses verbal understanding and consents to current therapy and treatment regimen.  No barriers to understanding were identified.  Red flag symptoms and signs discussed in detail.  Patient expressed understanding regarding what to do in case of emergency\urgent symptoms  Please see AVS handed out to patient at the end of our visit for further patient instructions/ counseling done pertaining to today's office visit.   Return for CPE/ yrly physical in January.    Note: This note was prepared with assistance of Dragon voice recognition software. Occasional wrong-word or sound-a-like substitutions King have occurred due to the inherent limitations of voice recognition software.  Ariel King 2:00 PM --------------------------------------------------------------------------------------------------------------------------------------------------------------------------------------------------------------------------------------------    Subjective:    CC:  Chief Complaint  Patient presents with  . Follow-up  . Depression    HPI: Ariel King is a 46 y.o. female who presents to [place] [facility] today for issues as discussed below.  - has not been seen for over 6 mo and last ov was acute ov for back pain and rpior for CPE.   - Mood  patient has been struggling a little bit with her mood lately.  She sees Dr. Toy Care of psychiatry.   - Hypothyroidism:  Feels levels are good-patient does not feel tired nor too excited etc.  She feels like her levels are probably okay.  - H/o HyperTG: Last checked was about 6-1/2 months ago.  Much improved from prior since she was exercising and doing weight loss etc.  On niacin.  Wt Readings from Last 3 Encounters:  01/01/18 200 lb (90.7 kg)  03/22/17 161 lb 12.8 oz (73.4 kg)  10/25/16 170 lb 12.8 oz (77.5 kg)   BP Readings from Last 3 Encounters:  01/01/18 99/64  10/17/17 111/69  03/22/17 121/84   Pulse Readings from Last 3 Encounters:  01/01/18 (!) 103  10/17/17 100  03/22/17 97   BMI Readings from Last 3 Encounters:  01/01/18 34.60 kg/m  03/22/17 27.99 kg/m  10/25/16 29.55 kg/m     Patient Care Team    Relationship Specialty Notifications Start End  Ariel Dance, DO PCP - General Family Medicine  05/08/16   Ariel May, MD Consulting Physician Psychiatry  08/29/16   Ariel King, DPM Consulting Physician Podiatry  01/01/18   [facility] [place] Physician Dermatology  01/01/18    Comment: does yrly skin screening.      Dr.  Terrall Laity, DO Consulting Physician Rehabilitation  01/01/18    Comment: spine and back pain     Patient Active Problem List   Diagnosis Date  Noted  . Chronic right-sided low back pain with right-sided sciatica 05/22/2017  . History of chlamydia 03/22/2017  . Muscle strain 03/22/2017  . Lumbar spine strain, initial encounter 03/22/2017  . Severe episode of recurrent major depressive disorder, without psychotic features ([place]) 03/05/2017  . Encounter for wellness examination 10/25/2016  . Panic attacks 06/09/2016  . Status post hysterectomy, left ovaries,  took cervix. 06/09/2016  . Vitamin D deficiency 06/09/2016  . Adult hypothyroidism 12/19/2015  . Obesity (BMI 30-39.9) 11/03/2013  . Chronic pain syndrome 12/25/2012  . h/o Blood glucose elevated 12/25/2012  . h/o Hypertriglyceridemia 12/25/2012  . Other insomnia 12/25/2012  . Chronic fatigue, unspecified 12/25/2012  . Migraine without aura and without status migrainosus, not intractable 12/25/2012  . Anxiety 03/14/2012  . Migraines 03/14/2012  . Depression 03/14/2012  . Chronic fatigue disorder 03/14/2012    Past Medical history, Surgical history, Family history, Social history, Allergies and Medications have been entered into the medical record, reviewed and changed as needed.    Current Meds  Medication Sig  . ALPRAZolam (XANAX) 0.5 MG tablet Take 1-2 tablets by mouth every 6 (six) hours as needed.  Marland Kitchen amphetamine-dextroamphetamine (ADDERALL) 20 MG tablet Take 1 tablet by mouth 3 (three) times daily.  . B Complex Vitamins (VITAMIN-B COMPLEX) TABS Take 1 tablet by mouth daily.  Marland Kitchen gabapentin (NEURONTIN) 300 MG capsule Take 300 mg by mouth 3 (three) times daily.  Marland Kitchen ibuprofen (ADVIL,MOTRIN) 600 MG tablet Take 1 tablet (600 mg total) by mouth every 8 (eight) hours as needed for moderate pain (short term use only).  Marland Kitchen levothyroxine (SYNTHROID, LEVOTHROID) 125 MCG tablet Take 1 tablet (125 mcg total) by mouth daily. Patient need follow up for additional refills  . niacin (NIASPAN) 1000 MG CR tablet Take 1 tablet (1,000 mg total) by mouth at bedtime. PATIENT NEEDS OFFICE VISIT PRIOR TO ANY FURTHER REFILLS  . QUEtiapine (SEROQUEL) 100 MG tablet Take 100 mg by mouth as needed.  . traZODone (DESYREL) 100 MG tablet Take 2 tablets by mouth at bedtime.  Marland Kitchen venlafaxine XR (EFFEXOR-XR) 75 MG 24 hr capsule Take 3 capsules by mouth every morning.  . Vitamin D, Ergocalciferol, (DRISDOL) 50000 units CAPS capsule Take 1 capsule (50,000 Units total) by mouth every 7 (seven) days.  . [DISCONTINUED]  cyclobenzaprine (FLEXERIL) 10 MG tablet Take 1 tablet (10 mg total) by mouth 3 (three) times daily as needed for muscle spasms.  . [DISCONTINUED] levothyroxine (SYNTHROID, LEVOTHROID) 125 MCG tablet Take 1 tablet (125 mcg total) by mouth daily. Patient need follow up for additional refills  . [DISCONTINUED] niacin (NIASPAN) 1000 MG CR tablet Take 1 tablet (1,000 mg total) at bedtime by mouth. PATIENT NEEDS OFFICE VISIT PRIOR TO ANY FURTHER REFILLS  . [DISCONTINUED] predniSONE (DELTASONE) 1 MG tablet Take 1 mg by mouth daily with breakfast.    Allergies:  Allergies  Allergen Reactions  . Naltrexone Other (See Comments)    Panic attack     Review of Systems: General:   Denies fever, chills, unexplained weight loss.  Optho/Auditory:   Denies visual changes, blurred vision/LOV Respiratory:   Denies wheeze, DOE more than baseline levels.  Cardiovascular:   Denies chest pain, palpitations, new onset peripheral edema  Gastrointestinal:   Denies nausea, vomiting, diarrhea, abd pain.  Genitourinary: Denies dysuria, freq/ urgency, flank pain or discharge from genitals.  Endocrine:     Denies hot or cold intolerance, polyuria, polydipsia. Musculoskeletal:   Denies unexplained myalgias, joint swelling, unexplained arthralgias, gait problems.  Skin:  Denies new onset rash, suspicious lesions Neurological:     Denies dizziness, unexplained weakness, numbness  Psychiatric/Behavioral:   Denies mood changes, suicidal or homicidal ideations, hallucinations    Objective:   There were no vitals taken for this visit. There is no height or weight on file to calculate BMI. General:  Well Developed, well nourished, appropriate for stated age.  Neuro:  Alert and oriented,  extra-ocular muscles intact  HEENT:  Normocephalic, atraumatic, neck supple, no carotid bruits appreciated  Skin:  no gross rash, warm, pink. Cardiac:  RRR, S1 S2 Respiratory:  ECTA B/L and A/P, Not using accessory muscles, speaking  in full sentences- unlabored. Vascular:  Ext warm, no cyanosis apprec.; cap RF less 2 sec. Psych:  No HI/SI, judgement and insight good, Euthymic mood. Full Affect.

## 2017-10-16 LAB — SPECIMEN STATUS

## 2017-10-17 ENCOUNTER — Ambulatory Visit: Payer: BC Managed Care – PPO

## 2017-10-17 ENCOUNTER — Ambulatory Visit (INDEPENDENT_AMBULATORY_CARE_PROVIDER_SITE_OTHER): Payer: Medicare Other | Admitting: Podiatry

## 2017-10-17 ENCOUNTER — Ambulatory Visit (INDEPENDENT_AMBULATORY_CARE_PROVIDER_SITE_OTHER): Payer: BC Managed Care – PPO

## 2017-10-17 ENCOUNTER — Other Ambulatory Visit: Payer: Self-pay | Admitting: Podiatry

## 2017-10-17 VITALS — BP 111/69 | HR 100 | Resp 16

## 2017-10-17 DIAGNOSIS — S9032XA Contusion of left foot, initial encounter: Secondary | ICD-10-CM

## 2017-10-17 MED ORDER — MELOXICAM 15 MG PO TABS: 15.0000 mg | ORAL_TABLET | Freq: Every day | ORAL | 3 refills | Status: DC

## 2017-10-17 NOTE — Progress Notes (Signed)
Subjective:  Patient ID: Ariel King, female    DOB: [birth year],  MRN: [id number] HPI Chief Complaint  Patient presents with  . Foot Injury    Lateral foot and ankle left - injury on Sep 05, 2017-was getting off the toilet and leg was numb and fell and twisted foot, went to [facility]-xrayed, said sprain, gave splint, Rx Tramadol, remommended ice, elevation and ROM exercises-no help, returned to clinic-gave a different ankle brace and referred to ortho-wearing boot today and still in pain-friend recommended our office    46 y.o. female presents with the above complaint.     Past Medical History:  Diagnosis Date  . Abnormal Pap smear   . Adult hypothyroidism 12/19/2015  . Anemia   . Anxiety   . Bladder disorder   . Chronic fatigue and immune dysfunction syndrome ([place])   . Chronic kidney disease   . Depression   . Depression   . EBV infection   . H/O bladder infections   . H/O joint problems   . Hx of migraines   . Incontinence   . Thyroid disease    Past Surgical History:  Procedure Laterality Date  . ROBOTIC ASSISTED LAP VAGINAL HYSTERECTOMY    . SYNOVECTOMY WRIST      Current Outpatient Medications:  .  ALPRAZolam (XANAX) 0.5 MG tablet, Take 1-2 tablets by mouth every 6 (six) hours as needed., Disp: , Rfl:  .  amphetamine-dextroamphetamine (ADDERALL) 20 MG tablet, Take 1 tablet by mouth 3 (three) times daily., Disp: , Rfl:  .  B Complex Vitamins (VITAMIN-B COMPLEX) TABS, Take 1 tablet by mouth daily., Disp: , Rfl:  .  gabapentin (NEURONTIN) 300 MG capsule, Take 300 mg by mouth 3 (three) times daily., Disp: , Rfl:  .  ibuprofen (ADVIL,MOTRIN) 600 MG tablet, Take 1 tablet (600 mg total) by mouth every 8 (eight) hours as needed for moderate pain (short term use only)., Disp: 30 tablet, Rfl: 0 .  levothyroxine (SYNTHROID, LEVOTHROID) 125 MCG tablet, Take 1 tablet (125 mcg total) by mouth daily. Patient need follow up for additional refills, Disp: 90 tablet, Rfl: 1 .  niacin  (NIASPAN) 1000 MG CR tablet, Take 1 tablet (1,000 mg total) by mouth at bedtime. PATIENT NEEDS OFFICE VISIT PRIOR TO ANY FURTHER REFILLS, Disp: 90 tablet, Rfl: 1 .  QUEtiapine (SEROQUEL) 100 MG tablet, Take 100 mg by mouth as needed., Disp: , Rfl:  .  traZODone (DESYREL) 100 MG tablet, Take 2 tablets by mouth at bedtime., Disp: , Rfl:  .  venlafaxine XR (EFFEXOR-XR) 75 MG 24 hr capsule, Take 3 capsules by mouth every morning., Disp: , Rfl:  .  Vitamin D, Ergocalciferol, (DRISDOL) 50000 units CAPS capsule, Take 1 capsule (50,000 Units total) by mouth every 7 (seven) days., Disp: 12 capsule, Rfl: 1  Allergies  Allergen Reactions  . Naltrexone Other (See Comments)    Panic attack   Review of Systems  All other systems reviewed and are negative.  Objective:   Vitals:   10/17/17 1038  BP: 111/69  Pulse: 100  Resp: 16    General: Well developed, nourished, in no acute distress, alert and oriented x3   Dermatological: Skin is warm, dry and supple bilateral. Nails x 10 are well maintained; remaining integument appears unremarkable at this time. There are no open sores, no preulcerative lesions, no rash or signs of infection present.  Vascular: Dorsalis Pedis artery and Posterior Tibial artery pedal pulses are 2/4 bilateral with immedate capillary fill time.  Pedal hair growth present. No varicosities and no lower extremity edema present bilateral.   Neruologic: Grossly intact via light touch bilateral. Vibratory intact via tuning fork bilateral. Protective threshold with Semmes Wienstein monofilament intact to all pedal sites bilateral. Patellar and Achilles deep tendon reflexes 2+ bilateral. No Babinski or clonus noted bilateral.   Musculoskeletal: No gross boney pedal deformities bilateral. No pain, crepitus, or limitation noted with foot and ankle range of motion bilateral. Muscular strength 5/5 in all groups tested bilateral.  There is no ecchymosis no edema no cellulitis drainage or odor.   She has pain on palpation of the anterior talofibular ligament with what appears to be a partial anterior drawer sign.  She also has tenderness on palpation of the sinus tarsi and of the navicular tuberosity.  She has no pain on medial lateral compression of the calcaneus and all of the tendons appear to be intact with exception of possible erroneous longus.  Gait: Unassisted, Nonantalgic.    Radiographs:  Radiographs 3 views left foot taken today demonstrate no osseous abnormalities.  There appears to be a possible fracture of the navicular though it is nondisplaced and it does correspond to tenderness on physical exam.  Also she has pain around the lateral aspect of the anterior calcaneus and again there is one area that appears to be a possible fracture of the anterior process of the calcaneus.  Assessment & Plan:   Assessment: Sprained ankle no ankle fracture.  Cannot rule out fractures of the navicular tuberosity and the navicular body.  Possibly a fracture of the anterior process of the calcaneus.  Plan: At this point I encouraged her to stay in the Cam walker and we will request an MRI since she has had no improvement since the date of injury.  I feel that there may be a tear of the anterior talofibular ligament and possible fracture that is not easily visualized on any of the previous radiographs.  We are requesting an MRI of the midfoot to the rear foot and ankle.  Placed on a compression anklet.     Lavana Huckeba T. Nedrow, [place]

## 2017-10-18 ENCOUNTER — Telehealth: Payer: Self-pay

## 2017-10-18 NOTE — Telephone Encounter (Addendum)
Per [employer], MRI has been approved from 10/18/17 to 11/16/17 Auth # [id number]  Patient has been notified of approval and will call scheduling department to set up own appt

## 2017-10-19 LAB — TSH: TSH: 1.06 u[IU]/mL (ref 0.450–4.500)

## 2017-10-19 LAB — VITAMIN D 25 HYDROXY (VIT D DEFICIENCY, FRACTURES): Vit D, 25-Hydroxy: 66.5 ng/mL (ref 30.0–100.0)

## 2017-10-19 LAB — T4, FREE: Free T4: 1.09 ng/dL (ref 0.82–1.77)

## 2017-10-25 ENCOUNTER — Ambulatory Visit
Admission: RE | Admit: 2017-10-25 | Discharge: 2017-10-25 | Disposition: A | Discharge status: Home / Self Care | Payer: Medicare Other | Source: Ambulatory Visit | Attending: Podiatry | Admitting: Podiatry

## 2017-10-25 DIAGNOSIS — X58XXXA Exposure to other specified factors, initial encounter: Secondary | ICD-10-CM | POA: Insufficient documentation

## 2017-10-25 DIAGNOSIS — S9032XA Contusion of left foot, initial encounter: Secondary | ICD-10-CM

## 2017-10-26 ENCOUNTER — Telehealth: Payer: Self-pay | Admitting: *Deleted

## 2017-10-26 NOTE — Telephone Encounter (Addendum)
-----   Message from Garrel Ridgel, [place] sent at 10/25/2017  4:44 PM EST ----- Send for an over read and inform patient of the delay please. 10/26/2017-I informed pt of Dr. Stephenie Acres request to send copy of MRI disc to a radiology specialist for more indepth information for treatment planning. Faxed request for copy of MRI disc to [facility].

## 2017-11-01 NOTE — Telephone Encounter (Signed)
Mailed copy of MRI disc to [facility]. 

## 2017-11-07 ENCOUNTER — Telehealth: Payer: Self-pay | Admitting: Family Medicine

## 2017-11-07 NOTE — Telephone Encounter (Signed)
Patient called to request these (2) Rx refills be called into pharmacy  levothyroxine (SYNTHROID, LEVOTHROID) 125 MCG tablet [[id number]]  Order Details  Dose: 125 mcg Route: Oral Frequency: Daily  Note to Pharmacy:  Please consider 90 day supplies to promote better adherence      Dispense Quantity: 90 tablet Refills: 1 Fills remaining: --        Sig: Take 1 tablet (125 mcg total) by mouth daily. Patient need follow up for additional refills     &   niacin (NIASPAN) 1000 MG CR tablet [[id number]]  Order Details  Dose: 1,000 mg Route: Oral Frequency: Daily at bedtime  Indications of Use: Hypertriglyceridemia  Dispense Quantity: 90 tablet Refills: 1 Fills remaining: --        Sig: Take 1 tablet (1,000 mg total) by mouth at bedtime. PATIENT NEEDS OFFICE VISIT PRIOR TO ANY FURTHER REFILLS      ---Patient uses:  [id number] [address], [place]

## 2017-11-07 NOTE — Telephone Encounter (Signed)
Pt informed that both prescriptions were refilled on 10/15/17 for 90 day supply with one refill.  Too early to request refills.  Pt expressed understanding.  Charyl Bigger, CMA

## 2017-11-15 ENCOUNTER — Encounter: Payer: Self-pay | Admitting: Podiatry

## 2017-11-21 ENCOUNTER — Encounter: Payer: Self-pay | Admitting: Podiatry

## 2017-11-21 ENCOUNTER — Ambulatory Visit: Payer: BC Managed Care – PPO | Admitting: Podiatry

## 2017-11-21 DIAGNOSIS — S93402D Sprain of unspecified ligament of left ankle, subsequent encounter: Secondary | ICD-10-CM

## 2017-11-21 NOTE — Progress Notes (Signed)
She presents today for follow-up of her painful left foot and ankle from an injury she incurred back in October when she fell.  She states that she is noticed some sensation back in her left foot recently.  Objective: Vital signs are stable alert and oriented x3.  Pulses are palpable.  Minimal swelling in the foot.  She has much decrease in pain on palpation of the anterior lateral ankle.  MRI demonstrates old ankle injury and some mild peroneal tendinitis.  I also demonstrate some mild edema to the fibula.  Assessment: Slowly healing ankle sprain left.  Plan: I instructed her to wear her cam walker for another month.  She is at that time to transition into her ankle stabilizer and tennis shoes.  I will follow-up with her at that time.

## 2017-12-05 ENCOUNTER — Encounter: Payer: Medicare Other | Admitting: Family Medicine

## 2017-12-26 ENCOUNTER — Encounter: Payer: Medicare Other | Admitting: Family Medicine

## 2018-01-01 ENCOUNTER — Encounter: Payer: Self-pay | Admitting: Family Medicine

## 2018-01-01 ENCOUNTER — Ambulatory Visit (INDEPENDENT_AMBULATORY_CARE_PROVIDER_SITE_OTHER): Payer: Medicare Other | Admitting: Family Medicine

## 2018-01-01 ENCOUNTER — Other Ambulatory Visit: Payer: Self-pay

## 2018-01-01 VITALS — BP 99/64 | HR 103 | Ht 63.75 in | Wt 200.0 lb

## 2018-01-01 DIAGNOSIS — Z1231 Encounter for screening mammogram for malignant neoplasm of breast: Secondary | ICD-10-CM | POA: Diagnosis not present

## 2018-01-01 DIAGNOSIS — R0982 Postnasal drip: Secondary | ICD-10-CM

## 2018-01-01 DIAGNOSIS — E559 Vitamin D deficiency, unspecified: Secondary | ICD-10-CM

## 2018-01-01 DIAGNOSIS — Z719 Counseling, unspecified: Secondary | ICD-10-CM | POA: Diagnosis not present

## 2018-01-01 DIAGNOSIS — Z0000 Encounter for general adult medical examination without abnormal findings: Secondary | ICD-10-CM

## 2018-01-01 DIAGNOSIS — H6983 Other specified disorders of Eustachian tube, bilateral: Secondary | ICD-10-CM

## 2018-01-01 DIAGNOSIS — J329 Chronic sinusitis, unspecified: Secondary | ICD-10-CM

## 2018-01-01 DIAGNOSIS — Z1239 Encounter for other screening for malignant neoplasm of breast: Secondary | ICD-10-CM

## 2018-01-01 MED ORDER — VITAMIN D (ERGOCALCIFEROL) 1.25 MG (50000 UNIT) PO CAPS: 50000.0000 [IU] | ORAL_CAPSULE | ORAL | 1 refills | Status: DC

## 2018-01-01 NOTE — Progress Notes (Signed)
Impression and Recommendations:    1. Encounter for wellness examination   2. Health education/counseling   3. Screening for breast cancer   4. Vitamin D deficiency      1) Anticipatory Guidance: Discussed importance of wearing a seatbelt while driving, not texting while driving; sunscreen when outside along with yearly skin surveillance; eating a well balanced and modest diet; physical activity at least 25 minutes per day or 150 min/ week of moderate to intense activity.  2) Immunizations / Screenings / Labs:  All immunizations and screenings that patient agrees to, are up-to-date per recommendations or will be updated today.  Patient understands the needs for q 50mo dental and yearly vision screens which pt will schedule independently. Obtain CBC, CMP, HgA1c, Lipid panel, TSH and vit D when fasting if not already done recently.   3) Weight:   Discussed goal of losing even 5-10% of current body weight which would improve overall feelings of well being and improve objective health data significantly.   Improve nutrient density of diet through increasing intake of fruits and vegetables and decreasing saturated/trans fats, white flour products and refined sugar products.   4) Vitamin D deficiency- start supplements today.  Meds ordered this encounter  Medications  . Vitamin D, Ergocalciferol, (DRISDOL) 50000 units CAPS capsule    Sig: Take 1 capsule (50,000 Units total) by mouth every 7 (seven) days.    Dispense:  12 capsule    Refill:  1    Orders Placed This Encounter  Procedures  . MM DIGITAL SCREENING BILATERAL    Gross side effects, risk and benefits, and alternatives of medications discussed with patient.  Patient is aware that all medications have potential side effects and we are unable to predict every side effect or drug-drug interaction that may occur.  Expresses verbal understanding and consents to current therapy plan and treatment regimen.  F-up preventative CPE  in 1 year. F/up sooner for chronic care management as discussed and/or prn.  Please see orders placed and AVS handed out to patient at the end of our visit for further patient instructions/ counseling done pertaining to today's office visit.  This document serves as a record of services personally performed by Mellody Dance, DO. It was created on her behalf by Mayer Masker, a trained medical scribe. The creation of this record is based on the scribe's personal observations and the provider's statements to them.  I have reviewed the above medical documentation for accuracy and completeness and I concur.  Mellody Dance 01/19/18 5:28 PM    Subjective:    Chief Complaint  Patient presents with  . Annual Exam   CC: CPE  HPI: Ariel King is a 47 y.o. female who presents to [place] [facility] today a yearly health maintenance exam.  Health Maintenance Summary Reviewed and updated, unless pt declines services.  Colonoscopy:   unnecessary Tobacco History Reviewed:   Y, she is still smoking "a little bit"  CT scan for screening lung CA:   NA Abdominal Ultrasound:     unnecessary Alcohol:    No concerns, no excessive use Exercise Habits:   Not meeting AHA guidelines STD concerns:   None- pt is monogamous, no concerns at this time. Drug Use:   None Birth control method:   n/a Menses regular:     N/a- pt had a hysterectomy  Lumps or breast concerns:      No- but pt has not had a recent mammogram.  She does regular breast exams. Breast Cancer Family History:      No, no other h/o CA uterine or ovarian either. Bone/ DEXA scan:    Unnecessary    Skin: Pt had a recent full body scan with results WNL. Dr. Nehemiah Massed at [facility] is her dermatologist.  Eye: UTD, pt goes Q 12 months for dilated eye exams.  Dental: UTD, pt goes Q 6 months. She has another appointment soon, within the next few weeks.  Sinuses: pt has chronic stuffiness/congestion in her  ears and frontal sinuses. She feels stuffy and states she usually has to go yearly to get her ears cleared out. She is requesting a referral to ENT in [place].  L leg: Dr Milinda Pointer, podiatrist. She has an injury with extensive workup. She states this injury is limiting her ability to exercise regularly.   OBGYN: NA, pt does not have one.  Mental health: Dr. Toy Care, psychiatrist. She has been feeling well recently.   Diet: she has not been eating as well as she should have.  Immunization History  Administered Date(s) Administered  . Tdap 06/15/2014    Health Maintenance  Topic Date Due  . INFLUENZA VACCINE  06/17/2018 (Originally 06/13/2017)  . TETANUS/TDAP  06/15/2024  . HIV Screening  Completed  . PAP SMEAR  Discontinued     Wt Readings from Last 3 Encounters:  01/01/18 200 lb (90.7 kg)  03/22/17 161 lb 12.8 oz (73.4 kg)  10/25/16 170 lb 12.8 oz (77.5 kg)   BP Readings from Last 3 Encounters:  01/01/18 99/64  10/17/17 111/69  03/22/17 121/84   Pulse Readings from Last 3 Encounters:  01/01/18 (!) 103  10/17/17 100  03/22/17 97     Past Medical History:  Diagnosis Date  . Abnormal Pap smear   . Adult hypothyroidism 12/19/2015  . Anemia   . Anxiety   . Bladder disorder   . Chronic fatigue and immune dysfunction syndrome ([place])   . Chronic kidney disease   . Depression   . Depression   . EBV infection   . H/O bladder infections   . H/O joint problems   . Hx of migraines   . Incontinence   . Thyroid disease       Past Surgical History:  Procedure Laterality Date  . ROBOTIC ASSISTED LAP VAGINAL HYSTERECTOMY    . SYNOVECTOMY WRIST        Family History  Problem Relation Age of Onset  . Diabetes Mother   . Migraines Mother   . Heart attack Mother   . Depression Mother   . Hypertension Mother   . Alcohol abuse Father   . Cancer Father        prostate  . Depression Sister       Social History   Substance and Sexual Activity  Drug Use No  ,    Social History   Substance and Sexual Activity  Alcohol Use No  ,   Social History   Tobacco Use  Smoking Status Former Smoker  . Types: Cigarettes  Smokeless Tobacco Never Used  Tobacco Comment   quit 10 years ago  ,   Social History   Substance and Sexual Activity  Sexual Activity Yes  . Partners: Male  . Birth control/protection: Other-see comments   Comment: hyst    Current Outpatient Medications on File Prior to Visit  Medication Sig Dispense Refill  . ALPRAZolam (XANAX) 0.5 MG tablet Take 1-2 tablets by mouth every 6 (six)  hours as needed.    Marland Kitchen amphetamine-dextroamphetamine (ADDERALL) 20 MG tablet Take 1 tablet by mouth 3 (three) times daily.    . B Complex Vitamins (VITAMIN-B COMPLEX) TABS Take 1 tablet by mouth daily.    Marland Kitchen gabapentin (NEURONTIN) 300 MG capsule Take 300 mg by mouth 3 (three) times daily.    Marland Kitchen ibuprofen (ADVIL,MOTRIN) 600 MG tablet Take 1 tablet (600 mg total) by mouth every 8 (eight) hours as needed for moderate pain (short term use only). 30 tablet 0  . levothyroxine (SYNTHROID, LEVOTHROID) 125 MCG tablet Take 1 tablet (125 mcg total) by mouth daily. Patient need follow up for additional refills 90 tablet 1  . meloxicam (MOBIC) 15 MG tablet Take 1 tablet (15 mg total) by mouth daily. 30 tablet 3  . niacin (NIASPAN) 1000 MG CR tablet Take 1 tablet (1,000 mg total) by mouth at bedtime. PATIENT NEEDS OFFICE VISIT PRIOR TO ANY FURTHER REFILLS 90 tablet 1  . QUEtiapine (SEROQUEL) 100 MG tablet Take 100 mg by mouth as needed.    . traZODone (DESYREL) 100 MG tablet Take 2 tablets by mouth at bedtime.    Marland Kitchen venlafaxine XR (EFFEXOR-XR) 75 MG 24 hr capsule Take 3 capsules by mouth every morning.     No current facility-administered medications on file prior to visit.     Allergies: Naltrexone  Review of Systems: General:   Denies fever, chills, unexplained weight loss.  Optho/Auditory:   Denies visual changes, blurred vision/LOV Respiratory:   Denies  SOB, DOE more than baseline levels.  Cardiovascular:   Denies chest pain, palpitations, new onset peripheral edema  Gastrointestinal:   Denies nausea, vomiting, diarrhea.  Genitourinary: Denies dysuria, freq/ urgency, flank pain or discharge from genitals.  Endocrine:     Denies hot or cold intolerance, polyuria, polydipsia. Musculoskeletal:   Denies unexplained myalgias, joint swelling, unexplained arthralgias, gait problems.  Skin:  Denies rash, suspicious lesions Neurological:     Denies dizziness, unexplained weakness, numbness  Psychiatric/Behavioral:   Denies mood changes, suicidal or homicidal ideations, hallucinations    Objective:    Blood pressure 99/64, pulse (!) 103, height 5' 3.75" (1.619 m), weight 200 lb (90.7 kg), SpO2 99 %. Body mass index is 34.6 kg/m. General Appearance:    Alert, cooperative, no distress, appears stated age  Head:    Normocephalic, without obvious abnormality, atraumatic  Eyes:    PERRL, conjunctiva/corneas clear, EOM's intact, fundi    benign, both eyes  Ears:    Normal TM's and external ear canals, both ears  Nose:   Nares normal, septum midline, mucosa normal, no drainage    or sinus tenderness  Throat:   Lips w/o lesion, mucosa moist, and tongue normal; teeth and   gums normal  Neck:   Supple, symmetrical, trachea midline, no adenopathy;    thyroid:  no enlargement/tenderness/nodules; no carotid   bruit or JVD  Back:     Symmetric, no curvature, ROM normal, no CVA tenderness  Lungs:     Clear to auscultation bilaterally, respirations unlabored, no       Wh/ R/ R  Chest Wall:    No tenderness or gross deformity; normal excursion   Heart:    Regular rate and rhythm, S1 and S2 normal, no murmur, rub   or gallop  Breast Exam:    No tenderness, masses, or nipple abnormality b/l; no d/c  Abdomen:     Soft, non-tender, bowel sounds active all four quadrants, NO   G/R/R, no masses,  no organomegaly  Genitalia:    Ext genitalia: without lesion, no  rash or discharge, No         tenderness;  Cervix: WNL's w/o discharge or lesion;        Adnexa:  No tenderness or palpable masses   Rectal:    Normal tone, no masses or tenderness;   guaiac negative stool. No blood in stool or hemorrhoid appreciated.   Extremities:   Extremities normal, atraumatic, no cyanosis or gross edema  Pulses:   2+ and symmetric all extremities  Skin:   Warm, dry, Skin color, texture, turgor normal, no obvious rashes or lesions Psych: No HI/SI, judgement and insight good, Euthymic mood. Full Affect.  Neurologic:   CNII-XII intact, normal strength, sensation and reflexes    Throughout   DEXA:    [email or web address]   Based on the [place] FRAX tool, a 48 year old white woman with no other risk factors has a 9.3% 10-year risk for any osteoporotic fracture. White women between the ages of 58 and 37 years with equivalent or greater 10-year fracture risks based on specific risk factors include but are not limited to the following persons: 71) a 48 year old current smoker with a BMI less than 21 kg/m2, daily alcohol use, and parental fracture history; 41) a 48 year old woman with a parental fracture history; 33) a 48 year old woman with a BMI less than 21 kg/m2 and daily alcohol use; and 24) a 48 year old current smoker with daily alcohol use.   The FRAX tool also predicts 10-year fracture risks for black, Asian, and Hispanic women in the [place]. In general, estimated fracture risks in nonwhite women are lower than those for white women of the same age. Although the USPSTF recommends using a 9.3% 10-year fracture risk threshold to screen women aged 71 to 63 years,

## 2018-01-01 NOTE — Progress Notes (Signed)
Patient requesting a referral to ENT, Dr. Raliegh Scarlet approved based on chronic sinuitis, ETD, and chronic PND with cough. MPulliam, CMA/RT(R)

## 2018-01-01 NOTE — Patient Instructions (Signed)
Lose it  app or My Fitness pal    Preventive Care for Adults, Female  A healthy lifestyle and preventive care can promote health and wellness. Preventive health guidelines for women include the following key practices.   A routine yearly physical is a good way to check with your health care provider about your health and preventive screening. It is a chance to share any concerns and updates on your health and to receive a thorough exam.   Visit your dentist for a routine exam and preventive care every 6 months. Brush your teeth twice a day and floss once a day. Good oral hygiene prevents tooth decay and gum disease.   The frequency of eye exams is based on your age, health, family medical history, use of contact lenses, and other factors. Follow your health care provider's recommendations for frequency of eye exams.   Eat a healthy diet. Foods like vegetables, fruits, whole grains, low-fat dairy products, and lean protein foods contain the nutrients you need without too many calories. Decrease your intake of foods high in solid fats, added sugars, and salt. Eat the right amount of calories for you.Get information about a proper diet from your health care provider, if necessary.   Regular physical exercise is one of the most important things you can do for your health. Most adults should get at least 150 minutes of moderate-intensity exercise (any activity that increases your heart rate and causes you to sweat) each week. In addition, most adults need muscle-strengthening exercises on 2 or more days a week.   Maintain a healthy weight. The body mass index (BMI) is a screening tool to identify possible weight problems. It provides an estimate of body fat based on height and weight. Your health care provider can find your BMI, and can help you achieve or maintain a healthy weight.For adults 20 years and older:   - A BMI below 18.5 is considered underweight.   - A BMI of 18.5 to 24.9 is  normal.   - A BMI of 25 to 29.9 is considered overweight.   - A BMI of 30 and above is considered obese.   Maintain normal blood lipids and cholesterol levels by exercising and minimizing your intake of trans and saturated fats.  Eat a balanced diet with plenty of fruit and vegetables. Blood tests for lipids and cholesterol should begin at age 7 and be repeated every 5 years minimum.  If your lipid or cholesterol levels are high, you are over 40, or you are at high risk for heart disease, you may need your cholesterol levels checked more frequently.Ongoing high lipid and cholesterol levels should be treated with medicines if diet and exercise are not working.   If you smoke, find out from your health care provider how to quit. If you do not use tobacco, do not start.   Lung cancer screening is recommended for adults aged 76-80 years who are at high risk for developing lung cancer because of a history of smoking. A yearly low-dose CT scan of the lungs is recommended for people who have at least a 30-pack-year history of smoking and are a current smoker or have quit within the past 15 years. A pack year of smoking is smoking an average of 1 pack of cigarettes a day for 1 year (for example: 1 pack a day for 30 years or 2 packs a day for 15 years). Yearly screening should continue until the smoker has stopped smoking for at least 15  years. Yearly screening should be stopped for people who develop a health problem that would prevent them from having lung cancer treatment.   If you are pregnant, do not drink alcohol. If you are breastfeeding, be very cautious about drinking alcohol. If you are not pregnant and choose to drink alcohol, do not have more than 1 drink per day. One drink is considered to be 12 ounces (355 mL) of beer, 5 ounces (148 mL) of wine, or 1.5 ounces (44 mL) of liquor.   Avoid use of street drugs. Do not share needles with anyone. Ask for help if you need support or instructions  about stopping the use of drugs.   High blood pressure causes heart disease and increases the risk of stroke. Your blood pressure should be checked at least yearly.  Ongoing high blood pressure should be treated with medicines if weight loss and exercise do not work.   If you are 60-76 years old, ask your health care provider if you should take aspirin to prevent strokes.   Diabetes screening involves taking a blood sample to check your fasting blood sugar level. This should be done once every 3 years, after age 31, if you are within normal weight and without risk factors for diabetes. Testing should be considered at a younger age or be carried out more frequently if you are overweight and have at least 1 risk factor for diabetes.   Breast cancer screening is essential preventive care for women. You should practice "breast self-awareness."  This means understanding the normal appearance and feel of your breasts and may include breast self-examination.  Any changes detected, no matter how small, should be reported to a health care provider.  Women in their 57s and 30s should have a clinical breast exam (CBE) by a health care provider as part of a regular health exam every 1 to 3 years.  After age 67, women should have a CBE every year.  Starting at age 75, women should consider having a mammogram (breast X-ray test) every year.  Women who have a family history of breast cancer should talk to their health care provider about genetic screening.  Women at a high risk of breast cancer should talk to their health care providers about having an MRI and a mammogram every year.   -Breast cancer gene (BRCA)-related cancer risk assessment is recommended for women who have family members with BRCA-related cancers. BRCA-related cancers include breast, ovarian, tubal, and peritoneal cancers. Having family members with these cancers may be associated with an increased risk for harmful changes (mutations) in the  breast cancer genes BRCA1 and BRCA2. Results of the assessment will determine the need for genetic counseling and BRCA1 and BRCA2 testing.   The Pap test is a screening test for cervical cancer. A Pap test can show cell changes on the cervix that might become cervical cancer if left untreated. A Pap test is a procedure in which cells are obtained and examined from the lower end of the uterus (cervix).   - Women should have a Pap test starting at age 66.   - Between ages 63 and 85, Pap tests should be repeated every 2 years.   - Beginning at age 89, you should have a Pap test every 3 years as long as the past 3 Pap tests have been normal.   - Some women have medical problems that increase the chance of getting cervical cancer. Talk to your health care provider about these problems. It is  especially important to talk to your health care provider if a new problem develops soon after your last Pap test. In these cases, your health care provider may recommend more frequent screening and Pap tests.   - The above recommendations are the same for women who have or have not gotten the vaccine for human papillomavirus (HPV).   - If you had a hysterectomy for a problem that was not cancer or a condition that could lead to cancer, then you no longer need Pap tests. Even if you no longer need a Pap test, a regular exam is a good idea to make sure no other problems are starting.   - If you are between ages 74 and 44 years, and you have had normal Pap tests going back 10 years, you no longer need Pap tests. Even if you no longer need a Pap test, a regular exam is a good idea to make sure no other problems are starting.   - If you have had past treatment for cervical cancer or a condition that could lead to cancer, you need Pap tests and screening for cancer for at least 20 years after your treatment.   - If Pap tests have been discontinued, risk factors (such as a new sexual partner) need to be reassessed to  determine if screening should be resumed.   - The HPV test is an additional test that may be used for cervical cancer screening. The HPV test looks for the virus that can cause the cell changes on the cervix. The cells collected during the Pap test can be tested for HPV. The HPV test could be used to screen women aged 44 years and older, and should be used in women of any age who have unclear Pap test results. After the age of 56, women should have HPV testing at the same frequency as a Pap test.   Colorectal cancer can be detected and often prevented. Most routine colorectal cancer screening begins at the age of 40 years and continues through age 58 years. However, your health care provider may recommend screening at an earlier age if you have risk factors for colon cancer. On a yearly basis, your health care provider may provide home test kits to check for hidden blood in the stool.  Use of a small camera at the end of a tube, to directly examine the colon (sigmoidoscopy or colonoscopy), can detect the earliest forms of colorectal cancer. Talk to your health care provider about this at age 30, when routine screening begins. Direct exam of the colon should be repeated every 5 -10 years through age 38 years, unless early forms of pre-cancerous polyps or small growths are found.   People who are at an increased risk for hepatitis B should be screened for this virus. You are considered at high risk for hepatitis B if:  -You were born in a country where hepatitis B occurs often. Talk with your health care provider about which countries are considered high risk.  - Your parents were born in a high-risk country and you have not received a shot to protect against hepatitis B (hepatitis B vaccine).  - You have HIV or AIDS.  - You use needles to inject street drugs.  - You live with, or have sex with, someone who has Hepatitis B.  - You get hemodialysis treatment.  - You take certain medicines for conditions  like cancer, organ transplantation, and autoimmune conditions.   Hepatitis C blood testing is recommended  for all people born from [birth year] through 1966 and any individual with known risks for hepatitis C.   Practice safe sex. Use condoms and avoid high-risk sexual practices to reduce the spread of sexually transmitted infections (STIs). STIs include gonorrhea, chlamydia, syphilis, trichomonas, herpes, HPV, and human immunodeficiency virus (HIV). Herpes, HIV, and HPV are viral illnesses that have no cure. They can result in disability, cancer, and death. Sexually active women aged 26 years and younger should be checked for chlamydia. Older women with new or multiple partners should also be tested for chlamydia. Testing for other STIs is recommended if you are sexually active and at increased risk.   Osteoporosis is a disease in which the bones lose minerals and strength with aging. This can result in serious bone fractures or breaks. The risk of osteoporosis can be identified using a bone density scan. Women ages 15 years and over and women at risk for fractures or osteoporosis should discuss screening with their health care providers. Ask your health care provider whether you should take a calcium supplement or vitamin D to There are also several preventive steps women can take to avoid osteoporosis and resulting fractures or to keep osteoporosis from worsening. -->Recommendations include:  Eat a balanced diet high in fruits, vegetables, calcium, and vitamins.  Get enough calcium. The recommended total intake of is 1,200 mg daily; for best absorption, if taking supplements, divide doses into 250-500 mg doses throughout the day. Of the two types of calcium, calcium carbonate is best absorbed when taken with food but calcium citrate can be taken on an empty stomach.  Get enough vitamin D. [employer] and the [employer] recommend at least 1,000 IU per day for women age 20 and over who are at  risk of vitamin D deficiency. Vitamin D deficiency can be caused by inadequate sun exposure (for example, those who live in [place]).  Avoid alcohol and smoking. Heavy alcohol intake (more than 7 drinks per week) increases the risk of falls and hip fracture and women smokers tend to lose bone more rapidly and have lower bone mass than nonsmokers. Stopping smoking is one of the most important changes women can make to improve their health and decrease risk for disease.  Be physically active every day. Weight-bearing exercise (for example, fast walking, hiking, jogging, and weight training) may strengthen bones or slow the rate of bone loss that comes with aging. Balancing and muscle-strengthening exercises can reduce the risk of falling and fracture.  Consider therapeutic medications. Currently, several types of effective drugs are available. Healthcare providers can recommend the type most appropriate for each woman.  Eliminate environmental factors that may contribute to accidents. Falls cause nearly 90% of all osteoporotic fractures, so reducing this risk is an important bone-health strategy. Measures include ample lighting, removing obstructions to walking, using nonskid rugs on floors, and placing mats and/or grab bars in showers.  Be aware of medication side effects. Some common medicines make bones weaker. These include a type of steroid drug called glucocorticoids used for arthritis and asthma, some antiseizure drugs, certain sleeping pills, treatments for endometriosis, and some cancer drugs. An overactive thyroid gland or using too much thyroid hormone for an underactive thyroid can also be a problem. If you are taking these medicines, talk to your doctor about what you can do to help protect your bones.reduce the rate of osteoporosis.    Menopause can be associated with physical symptoms and risks. Hormone replacement therapy is available to decrease symptoms  and risks. You should  talk to your health care provider about whether hormone replacement therapy is right for you.   Use sunscreen. Apply sunscreen liberally and repeatedly throughout the day. You should seek shade when your shadow is shorter than you. Protect yourself by wearing long sleeves, pants, a wide-brimmed hat, and sunglasses year round, whenever you are outdoors.   Once a month, do a whole body skin exam, using a mirror to look at the skin on your back. Tell your health care provider of new moles, moles that have irregular borders, moles that are larger than a pencil eraser, or moles that have changed in shape or color.   -Stay current with required vaccines (immunizations).   Influenza vaccine. All adults should be immunized every year.  Tetanus, diphtheria, and acellular pertussis (Td, Tdap) vaccine. Pregnant women should receive 1 dose of Tdap vaccine during each pregnancy. The dose should be obtained regardless of the length of time since the last dose. Immunization is preferred during the 27th 36th week of gestation. An adult who has not previously received Tdap or who does not know her vaccine status should receive 1 dose of Tdap. This initial dose should be followed by tetanus and diphtheria toxoids (Td) booster doses every 10 years. Adults with an unknown or incomplete history of completing a 3-dose immunization series with Td-containing vaccines should begin or complete a primary immunization series including a Tdap dose. Adults should receive a Td booster every 10 years.  Varicella vaccine. An adult without evidence of immunity to varicella should receive 2 doses or a second dose if she has previously received 1 dose. Pregnant females who do not have evidence of immunity should receive the first dose after pregnancy. This first dose should be obtained before leaving the health care facility. The second dose should be obtained 4 8 weeks after the first dose.  Human papillomavirus (HPV) vaccine.  Females aged 4 26 years who have not received the vaccine previously should obtain the 3-dose series. The vaccine is not recommended for use in pregnant females. However, pregnancy testing is not needed before receiving a dose. If a female is found to be pregnant after receiving a dose, no treatment is needed. In that case, the remaining doses should be delayed until after the pregnancy. Immunization is recommended for any person with an immunocompromised condition through the age of 11 years if she did not get any or all doses earlier. During the 3-dose series, the second dose should be obtained 4 8 weeks after the first dose. The third dose should be obtained 24 weeks after the first dose and 16 weeks after the second dose.  Zoster vaccine. One dose is recommended for adults aged 58 years or older unless certain conditions are present.  Measles, mumps, and rubella (MMR) vaccine. Adults born before [birth year] generally are considered immune to measles and mumps. Adults born in [birth year] or later should have 1 or more doses of MMR vaccine unless there is a contraindication to the vaccine or there is laboratory evidence of immunity to each of the three diseases. A routine second dose of MMR vaccine should be obtained at least 28 days after the first dose for students attending postsecondary schools, health care workers, or international travelers. People who received inactivated measles vaccine or an unknown type of measles vaccine during 1963 1967 should receive 2 doses of MMR vaccine. People who received inactivated mumps vaccine or an unknown type of mumps vaccine before 1979 and are at high  risk for mumps infection should consider immunization with 2 doses of MMR vaccine. For females of childbearing age, rubella immunity should be determined. If there is no evidence of immunity, females who are not pregnant should be vaccinated. If there is no evidence of immunity, females who are pregnant should delay immunization  until after pregnancy. Unvaccinated health care workers born before [birth year] who lack laboratory evidence of measles, mumps, or rubella immunity or laboratory confirmation of disease should consider measles and mumps immunization with 2 doses of MMR vaccine or rubella immunization with 1 dose of MMR vaccine.  Pneumococcal 13-valent conjugate (PCV13) vaccine. When indicated, a person who is uncertain of her immunization history and has no record of immunization should receive the PCV13 vaccine. An adult aged 23 years or older who has certain medical conditions and has not been previously immunized should receive 1 dose of PCV13 vaccine. This PCV13 should be followed with a dose of pneumococcal polysaccharide (PPSV23) vaccine. The PPSV23 vaccine dose should be obtained at least 8 weeks after the dose of PCV13 vaccine. An adult aged 63 years or older who has certain medical conditions and previously received 1 or more doses of PPSV23 vaccine should receive 1 dose of PCV13. The PCV13 vaccine dose should be obtained 1 or more years after the last PPSV23 vaccine dose.  Pneumococcal polysaccharide (PPSV23) vaccine. When PCV13 is also indicated, PCV13 should be obtained first. All adults aged 42 years and older should be immunized. An adult younger than age 40 years who has certain medical conditions should be immunized. Any person who resides in a nursing home or long-term care facility should be immunized. An adult smoker should be immunized. People with an immunocompromised condition and certain other conditions should receive both PCV13 and PPSV23 vaccines. People with human immunodeficiency virus (HIV) infection should be immunized as soon as possible after diagnosis. Immunization during chemotherapy or radiation therapy should be avoided. Routine use of PPSV23 vaccine is not recommended for American Indians, [place] Natives, or people younger than 65 years unless there are medical conditions that require PPSV23  vaccine. When indicated, people who have unknown immunization and have no record of immunization should receive PPSV23 vaccine. One-time revaccination 5 years after the first dose of PPSV23 is recommended for people aged 41 64 years who have chronic kidney failure, nephrotic syndrome, asplenia, or immunocompromised conditions. People who received 1 2 doses of PPSV23 before age 72 years should receive another dose of PPSV23 vaccine at age 31 years or later if at least 5 years have passed since the previous dose. Doses of PPSV23 are not needed for people immunized with PPSV23 at or after age 56 years.  Meningococcal vaccine. Adults with asplenia or persistent complement component deficiencies should receive 2 doses of quadrivalent meningococcal conjugate (MenACWY-D) vaccine. The doses should be obtained at least 2 months apart. Microbiologists working with certain meningococcal bacteria, [employer] recruits, people at risk during an outbreak, and people who travel to or live in countries with a high rate of meningitis should be immunized. A first-year college student up through age 29 years who is living in a residence hall should receive a dose if she did not receive a dose on or after her 16th birthday. Adults who have certain high-risk conditions should receive one or more doses of vaccine.  Hepatitis A vaccine. Adults who wish to be protected from this disease, have certain high-risk conditions, work with hepatitis A-infected animals, work in hepatitis A research labs, or travel to or work  in countries with a high rate of hepatitis A should be immunized. Adults who were previously unvaccinated and who anticipate close contact with an international adoptee during the first 60 days after arrival in the [place] from a country with a high rate of hepatitis A should be immunized.  Hepatitis B vaccine.  Adults who wish to be protected from this disease, have certain high-risk conditions, may be exposed to  blood or other infectious body fluids, are household contacts or sex partners of hepatitis B positive people, are clients or workers in certain care facilities, or travel to or work in countries with a high rate of hepatitis B should be immunized.  Haemophilus influenzae type b (Hib) vaccine. A previously unvaccinated person with asplenia or sickle cell disease or having a scheduled splenectomy should receive 1 dose of Hib vaccine. Regardless of previous immunization, a recipient of a hematopoietic stem cell transplant should receive a 3-dose series 6 12 months after her successful transplant. Hib vaccine is not recommended for adults with HIV infection.  Preventive Services / Frequency Ages 49 to 39years  Blood pressure check.** / Every 1 to 2 years.  Lipid and cholesterol check.** / Every 5 years beginning at age 3.  Clinical breast exam.** / Every 3 years for women in their 8s and 38s.  BRCA-related cancer risk assessment.** / For women who have family members with a BRCA-related cancer (breast, ovarian, tubal, or peritoneal cancers).  Pap test.** / Every 2 years from ages 46 through 94. Every 3 years starting at age 80 through age 21 or 75 with a history of 3 consecutive normal Pap tests.  HPV screening.** / Every 3 years from ages 52 through ages 26 to 97 with a history of 3 consecutive normal Pap tests.  Hepatitis C blood test.** / For any individual with known risks for hepatitis C.  Skin self-exam. / Monthly.  Influenza vaccine. / Every year.  Tetanus, diphtheria, and acellular pertussis (Tdap, Td) vaccine.** / Consult your health care provider. Pregnant women should receive 1 dose of Tdap vaccine during each pregnancy. 1 dose of Td every 10 years.  Varicella vaccine.** / Consult your health care provider. Pregnant females who do not have evidence of immunity should receive the first dose after pregnancy.  HPV vaccine. / 3 doses over 6 months, if 48 and younger. The vaccine is  not recommended for use in pregnant females. However, pregnancy testing is not needed before receiving a dose.  Measles, mumps, rubella (MMR) vaccine.** / You need at least 1 dose of MMR if you were born in 1957 or later. You may also need a 2nd dose. For females of childbearing age, rubella immunity should be determined. If there is no evidence of immunity, females who are not pregnant should be vaccinated. If there is no evidence of immunity, females who are pregnant should delay immunization until after pregnancy.  Pneumococcal 13-valent conjugate (PCV13) vaccine.** / Consult your health care provider.  Pneumococcal polysaccharide (PPSV23) vaccine.** / 1 to 2 doses if you smoke cigarettes or if you have certain conditions.  Meningococcal vaccine.** / 1 dose if you are age 40 to 71 years and a Market researcher living in a residence hall, or have one of several medical conditions, you need to get vaccinated against meningococcal disease. You may also need additional booster doses.  Hepatitis A vaccine.** / Consult your health care provider.  Hepatitis B vaccine.** / Consult your health care provider.  Haemophilus influenzae type b (Hib) vaccine.** /  Consult your health care provider.  Ages 37 to 64years  Blood pressure check.** / Every 1 to 2 years.  Lipid and cholesterol check.** / Every 5 years beginning at age 46 years.  Lung cancer screening. / Every year if you are aged 79 80 years and have a 30-pack-year history of smoking and currently smoke or have quit within the past 15 years. Yearly screening is stopped once you have quit smoking for at least 15 years or develop a health problem that would prevent you from having lung cancer treatment.  Clinical breast exam.** / Every year after age 24 years.  BRCA-related cancer risk assessment.** / For women who have family members with a BRCA-related cancer (breast, ovarian, tubal, or peritoneal cancers).  Mammogram.** / Every  year beginning at age 4 years and continuing for as long as you are in good health. Consult with your health care provider.  Pap test.** / Every 3 years starting at age 68 years through age 103 or 98 years with a history of 3 consecutive normal Pap tests.  HPV screening.** / Every 3 years from ages 49 years through ages 6 to 15 years with a history of 3 consecutive normal Pap tests.  Fecal occult blood test (FOBT) of stool. / Every year beginning at age 90 years and continuing until age 82 years. You may not need to do this test if you get a colonoscopy every 10 years.  Flexible sigmoidoscopy or colonoscopy.** / Every 5 years for a flexible sigmoidoscopy or every 10 years for a colonoscopy beginning at age 18 years and continuing until age 53 years.  Hepatitis C blood test.** / For all people born from [birth year] through 1966 and any individual with known risks for hepatitis C.  Skin self-exam. / Monthly.  Influenza vaccine. / Every year.  Tetanus, diphtheria, and acellular pertussis (Tdap/Td) vaccine.** / Consult your health care provider. Pregnant women should receive 1 dose of Tdap vaccine during each pregnancy. 1 dose of Td every 10 years.  Varicella vaccine.** / Consult your health care provider. Pregnant females who do not have evidence of immunity should receive the first dose after pregnancy.  Zoster vaccine.** / 1 dose for adults aged 80 years or older.  Measles, mumps, rubella (MMR) vaccine.** / You need at least 1 dose of MMR if you were born in 1957 or later. You may also need a 2nd dose. For females of childbearing age, rubella immunity should be determined. If there is no evidence of immunity, females who are not pregnant should be vaccinated. If there is no evidence of immunity, females who are pregnant should delay immunization until after pregnancy.  Pneumococcal 13-valent conjugate (PCV13) vaccine.** / Consult your health care provider.  Pneumococcal polysaccharide (PPSV23)  vaccine.** / 1 to 2 doses if you smoke cigarettes or if you have certain conditions.  Meningococcal vaccine.** / Consult your health care provider.  Hepatitis A vaccine.** / Consult your health care provider.  Hepatitis B vaccine.** / Consult your health care provider.  Haemophilus influenzae type b (Hib) vaccine.** / Consult your health care provider.  Ages 56 years and over  Blood pressure check.** / Every 1 to 2 years.  Lipid and cholesterol check.** / Every 5 years beginning at age 85 years.  Lung cancer screening. / Every year if you are aged 39 80 years and have a 30-pack-year history of smoking and currently smoke or have quit within the past 15 years. Yearly screening is stopped once you have quit smoking  for at least 15 years or develop a health problem that would prevent you from having lung cancer treatment.  Clinical breast exam.** / Every year after age 15 years.  BRCA-related cancer risk assessment.** / For women who have family members with a BRCA-related cancer (breast, ovarian, tubal, or peritoneal cancers).  Mammogram.** / Every year beginning at age 73 years and continuing for as long as you are in good health. Consult with your health care provider.  Pap test.** / Every 3 years starting at age 25 years through age 61 or 83 years with 3 consecutive normal Pap tests. Testing can be stopped between 65 and 70 years with 3 consecutive normal Pap tests and no abnormal Pap or HPV tests in the past 10 years.  HPV screening.** / Every 3 years from ages 33 years through ages 64 or 34 years with a history of 3 consecutive normal Pap tests. Testing can be stopped between 65 and 70 years with 3 consecutive normal Pap tests and no abnormal Pap or HPV tests in the past 10 years.  Fecal occult blood test (FOBT) of stool. / Every year beginning at age 31 years and continuing until age 71 years. You may not need to do this test if you get a colonoscopy every 10 years.  Flexible  sigmoidoscopy or colonoscopy.** / Every 5 years for a flexible sigmoidoscopy or every 10 years for a colonoscopy beginning at age 5 years and continuing until age 57 years.  Hepatitis C blood test.** / For all people born from [birth year] through 1966 and any individual with known risks for hepatitis C.  Osteoporosis screening.** / A one-time screening for women ages 81 years and over and women at risk for fractures or osteoporosis.  Skin self-exam. / Monthly.  Influenza vaccine. / Every year.  Tetanus, diphtheria, and acellular pertussis (Tdap/Td) vaccine.** / 1 dose of Td every 10 years.  Varicella vaccine.** / Consult your health care provider.  Zoster vaccine.** / 1 dose for adults aged 80 years or older.  Pneumococcal 13-valent conjugate (PCV13) vaccine.** / Consult your health care provider.  Pneumococcal polysaccharide (PPSV23) vaccine.** / 1 dose for all adults aged 79 years and older.  Meningococcal vaccine.** / Consult your health care provider.  Hepatitis A vaccine.** / Consult your health care provider.  Hepatitis B vaccine.** / Consult your health care provider.  Haemophilus influenzae type b (Hib) vaccine.** / Consult your health care provider. ** Family history and personal history of risk and conditions may change your health care provider's recommendations. Document Released: 12/26/2001 Document Revised: 08/20/2013  [facility] Patient Information 2014 [place], [place].   EXERCISE AND DIET:  We recommended that you start or continue a regular exercise program for good health. Regular exercise means any activity that makes your heart beat faster and makes you sweat.  We recommend exercising at least 30 minutes per day at least 3 days a week, preferably 5.  We also recommend a diet low in fat and sugar / carbohydrates.  Inactivity, poor dietary choices and obesity can cause diabetes, heart attack, stroke, and kidney damage, among others.     ALCOHOL AND SMOKING:  Women should  limit their alcohol intake to no more than 7 drinks/beers/glasses of wine (combined, not each!) per week. Moderation of alcohol intake to this level decreases your risk of breast cancer and liver damage.  ( And of course, no recreational drugs are part of a healthy lifestyle.)  Also, you should not be smoking at all or even  being exposed to second hand smoke. Most people know smoking can cause cancer, and various heart and lung diseases, but did you know it also contributes to weakening of your bones?  Aging of your skin?  Yellowing of your teeth and nails?   CALCIUM AND VITAMIN D:  Adequate intake of calcium and Vitamin D are recommended.  The recommendations for exact amounts of these supplements seem to change often, but generally speaking 600 mg of calcium (either carbonate or citrate) and 800 units of Vitamin D per day seems prudent. Certain women may benefit from higher intake of Vitamin D.  If you are among these women, your doctor will have told you during your visit.     PAP SMEARS:  Pap smears, to check for cervical cancer or precancers,  have traditionally been done yearly, although recent scientific advances have shown that most women can have pap smears less often.  However, every woman still should have a physical exam from her gynecologist or primary care physician every year. It will include a breast check, inspection of the vulva and vagina to check for abnormal growths or skin changes, a visual exam of the cervix, and then an exam to evaluate the size and shape of the uterus and ovaries.  And after 48 years of age, a rectal exam is indicated to check for rectal cancers. We will also provide age appropriate advice regarding health maintenance, like when you should have certain vaccines, screening for sexually transmitted diseases, bone density testing, colonoscopy, mammograms, etc.    MAMMOGRAMS:  All women over 87 years old should have a yearly mammogram. Many facilities now offer a  "3D" mammogram, which may cost around $50 extra out of pocket. If possible,  we recommend you accept the option to have the 3D mammogram performed.  It both reduces the number of women who will be called back for extra views which then turn out to be normal, and it is better than the routine mammogram at detecting truly abnormal areas.     COLONOSCOPY:  Colonoscopy to screen for colon cancer is recommended for all women at age 50.  We know, you hate the idea of the prep.  We agree, BUT, having colon cancer and not knowing it is worse!!  Colon cancer so often starts as a polyp that can be seen and removed at colonscopy, which can quite literally save your life!  And if your first colonoscopy is normal and you have no family history of colon cancer, most women don't have to have it again for 10 years.  Once every ten years, you can do something that may end up saving your life, right?  We will be happy to help you get it scheduled when you are ready.  Be sure to check your insurance coverage so you understand how much it will cost.  It may be covered as a preventative service at no cost, but you should check your particular policy.      Please realize, EXERCISE IS MEDICINE!  -  American Heart Association [facility]) guidelines for exercise : If you are in good health, without any medical conditions, you should engage in 150 minutes of moderate intensity aerobic activity per week.  This means you should be huffing and puffing throughout your workout.   Engaging in regular exercise will improve brain function and memory, as well as improve mood, boost immune system and help with weight management.  As well as the other, more well-known effects of exercise  such as decreasing blood sugar levels, decreasing blood pressure,  and decreasing bad cholesterol levels/ increasing good cholesterol levels.     -  The AHA strongly endorses consumption of a diet that contains a variety of foods from all the food categories  with an emphasis on fruits and vegetables; fat-free and low-fat dairy products; cereal and grain products; legumes and nuts; and fish, poultry, and/or extra lean meats.    Excessive food intake, especially of foods high in saturated and trans fats, sugar, and salt, should be avoided.    Adequate water intake of roughly 1/2 of your weight in pounds, should equal the ounces of water per day you should drink.  So for instance, if you're 200 pounds, that would be 100 ounces of water per day.         Mediterranean Diet  Why follow it? Research shows. . Those who follow the Mediterranean diet have a reduced risk of heart disease  . The diet is associated with a reduced incidence of Parkinson's and Alzheimer's diseases . People following the diet may have longer life expectancies and lower rates of chronic diseases  . The Dietary Guidelines for Americans recommends the Mediterranean diet as an eating plan to promote health and prevent disease  What Is the Mediterranean Diet?  . Healthy eating plan based on typical foods and recipes of Mediterranean-style cooking . The diet is primarily a plant based diet; these foods should make up a majority of meals   Starches - Plant based foods should make up a majority of meals - They are an important sources of vitamins, minerals, energy, antioxidants, and fiber - Choose whole grains, foods high in fiber and minimally processed items  - Typical grain sources include wheat, oats, barley, corn, brown rice, bulgar, farro, millet, polenta, couscous  - Various types of beans include chickpeas, lentils, fava beans, black beans, white beans   Fruits  Veggies - Large quantities of antioxidant rich fruits & veggies; 6 or more servings  - Vegetables can be eaten raw or lightly drizzled with oil and cooked  - Vegetables common to the traditional Mediterranean Diet include: artichokes, arugula, beets, broccoli, brussel sprouts, cabbage, carrots, celery, collard greens,  cucumbers, eggplant, kale, leeks, lemons, lettuce, mushrooms, okra, onions, peas, peppers, potatoes, pumpkin, radishes, rutabaga, shallots, spinach, sweet potatoes, turnips, zucchini - Fruits common to the Mediterranean Diet include: apples, apricots, avocados, cherries, clementines, dates, figs, grapefruits, grapes, melons, nectarines, oranges, peaches, pears, pomegranates, strawberries, tangerines  Fats - Replace butter and margarine with healthy oils, such as olive oil, canola oil, and tahini  - Limit nuts to no more than a handful a day  - Nuts include walnuts, almonds, pecans, pistachios, pine nuts  - Limit or avoid candied, honey roasted or heavily salted nuts - Olives are central to the [employer] - can be eaten whole or used in a variety of dishes   Meats Protein - Limiting red meat: no more than a few times a month - When eating red meat: choose lean cuts and keep the portion to the size of deck of cards - Eggs: approx. 0 to 4 times a week  - Fish and lean poultry: at least 2 a week  - Healthy protein sources include, chicken, [place], lean beef, lamb - Increase intake of seafood such as tuna, salmon, trout, mackerel, shrimp, scallops - Avoid or limit high fat processed meats such as sausage and bacon  Dairy - Include moderate amounts of low fat dairy products  -  Focus on healthy dairy such as fat free yogurt, skim milk, low or reduced fat cheese - Limit dairy products higher in fat such as whole or 2% milk, cheese, ice cream  Alcohol - Moderate amounts of red wine is ok  - No more than 5 oz daily for women (all ages) and men older than age 54  - No more than 10 oz of wine daily for men younger than 80  Other - Limit sweets and other desserts  - Use herbs and spices instead of salt to flavor foods  - Herbs and spices common to the traditional Mediterranean Diet include: basil, bay leaves, chives, cloves, cumin, fennel, garlic, lavender, marjoram, mint, oregano, parsley, pepper,  rosemary, sage, savory, sumac, tarragon, thyme   It's not just a diet, it's a lifestyle:  . The Mediterranean diet includes lifestyle factors typical of those in the region  . Foods, drinks and meals are best eaten with others and savored . Daily physical activity is important for overall good health . This could be strenuous exercise like running and aerobics . This could also be more leisurely activities such as walking, housework, yard-work, or taking the stairs . Moderation is the key; a balanced and healthy diet accommodates most foods and drinks . Consider portion sizes and frequency of consumption of certain foods   Meal Ideas & Options:  . Breakfast:  o Whole wheat toast or whole wheat English muffins with peanut butter & hard boiled egg o Steel cut oats topped with apples & cinnamon and skim milk  o Fresh fruit: banana, strawberries, melon, berries, peaches  o Smoothies: strawberries, bananas, greek yogurt, peanut butter o Low fat greek yogurt with blueberries and granola  o Egg white omelet with spinach and mushrooms o Breakfast couscous: whole wheat couscous, apricots, skim milk, cranberries  . Sandwiches:  o Hummus and grilled vegetables (peppers, zucchini, squash) on whole wheat bread   o Grilled chicken on whole wheat pita with lettuce, tomatoes, cucumbers or tzatziki  o Tuna salad on whole wheat bread: tuna salad made with greek yogurt, olives, red peppers, capers, green onions o Garlic rosemary lamb pita: lamb sauted with garlic, rosemary, salt & pepper; add lettuce, cucumber, greek yogurt to pita - flavor with lemon juice and black pepper  . Seafood:  o Mediterranean grilled salmon, seasoned with garlic, basil, parsley, lemon juice and black pepper o Shrimp, lemon, and spinach whole-grain pasta salad made with low fat greek yogurt  o Seared scallops with lemon orzo  o Seared tuna steaks seasoned salt, pepper, coriander topped with tomato mixture of olives, tomatoes,  olive oil, minced garlic, parsley, green onions and cappers  . Meats:  o Herbed greek chicken salad with kalamata olives, cucumber, feta  o Red bell peppers stuffed with spinach, bulgur, lean ground beef (or lentils) & topped with feta   o Kebabs: skewers of chicken, tomatoes, onions, zucchini, squash  o [place] burgers: made with red onions, mint, dill, lemon juice, feta cheese topped with roasted red peppers . Vegetarian o Cucumber salad: cucumbers, artichoke hearts, celery, red onion, feta cheese, tossed in olive oil & lemon juice  o Hummus and whole grain pita points with a greek salad (lettuce, tomato, feta, olives, cucumbers, red onion) o Lentil soup with celery, carrots made with vegetable broth, garlic, salt and pepper  o Tabouli salad: parsley, bulgur, mint, scallions, cucumbers, tomato, radishes, lemon juice, olive oil, salt and pepper.

## 2018-01-02 ENCOUNTER — Encounter: Payer: Self-pay | Admitting: Family Medicine

## 2018-01-02 ENCOUNTER — Encounter: Payer: Self-pay | Admitting: Podiatry

## 2018-01-02 ENCOUNTER — Ambulatory Visit (INDEPENDENT_AMBULATORY_CARE_PROVIDER_SITE_OTHER): Payer: Medicare Other | Admitting: Podiatry

## 2018-01-02 DIAGNOSIS — S93402D Sprain of unspecified ligament of left ankle, subsequent encounter: Secondary | ICD-10-CM

## 2018-01-02 MED ORDER — DICLOFENAC SODIUM 1 % TD GEL: 4.0000 g | Freq: Four times a day (QID) | TRANSDERMAL | 2 refills | Status: DC

## 2018-01-02 NOTE — Progress Notes (Signed)
Presents today for follow-up of ankle sprain states that I am still having a lot of pain on the left side of my ankle and foot.  Objective: Vital signs are stable she is alert and oriented x3 she still has pain on palpation of the anterior talofibular ligament area and along the lateral aspect of the foot and the peroneal tendons.  Assessment: Chronic ankle sprain chronic pain.  Plan: Place her in a Tri-Lock brace and I will follow-up with her in about a month or try to get her out of the Cam walker and see if that makes a difference.  May need to go back and reevaluate the MRI again.

## 2018-01-07 ENCOUNTER — Telehealth: Payer: Self-pay | Admitting: *Deleted

## 2018-01-07 NOTE — Telephone Encounter (Signed)
Pt asked what exercises would help her circulation. Then pt called back and asked if the brace was to be tight on the foot.

## 2018-01-07 NOTE — Telephone Encounter (Signed)
I told pt that walking in the brace and a supportive shoe would help with the circulation and even gently rotating the ankle or lifting the foot back and forth would help with the circulation some.

## 2018-01-14 ENCOUNTER — Telehealth: Payer: Self-pay | Admitting: Podiatry

## 2018-01-14 NOTE — Telephone Encounter (Signed)
Can you ask the doctor if I can use the treadmill but like on number 1? Please call me back at [phone]. I would appreciate it. Thank you.

## 2018-01-16 NOTE — Telephone Encounter (Signed)
Patient informed via voice mail that it is ok to walk at a slow pace for now on the treadmill.  I informed her to wear her ankle brace and walk about 15-20 minutes then work the time up.  If she starts having any pain, she is to ice, elevate, and call for follow up appt

## 2018-01-28 ENCOUNTER — Ambulatory Visit (INDEPENDENT_AMBULATORY_CARE_PROVIDER_SITE_OTHER): Payer: BC Managed Care – PPO | Admitting: Podiatry

## 2018-01-28 ENCOUNTER — Encounter: Payer: Self-pay | Admitting: Podiatry

## 2018-01-28 DIAGNOSIS — M722 Plantar fascial fibromatosis: Secondary | ICD-10-CM | POA: Diagnosis not present

## 2018-01-28 NOTE — Progress Notes (Signed)
She presents today for follow-up of her left ankle sprain.  She states is doing a little better still hurts in the heel and the ankle brace seems to really help more than the boot.  She states that I am having pain right here in the heel as she points to her medial aspect of the left heel.  Objective: Vital signs are stable she is alert and oriented x3.  Still has pain on the dorsal lateral aspect of the left foot pain is out of proportion.  She has significant pain on palpation medial calcaneal tubercle of the left heel.  There is no calf pain there is no fibular pain and pulses remain strongly palpable.  Assessment: Plantar fasciitis possibly resulting in lateral ankle and dorsal lateral foot pain  Plan: Discussed etiology pathology conservative versus surgical therapies.  After sterile Betadine skin prep and thorough discussion today we injected 20 mg of Kenalog 5 mg of Marcaine to the medial aspect of the left heel at the plantar fascial calcaneal insertion site.  She tolerated procedure well without complications I will follow-up with her in 3-4 weeks for reevaluation.

## 2018-02-01 ENCOUNTER — Telehealth: Payer: Self-pay | Admitting: Family Medicine

## 2018-02-01 NOTE — Telephone Encounter (Signed)
Patient called states had migraine over weekend, took meds and by Monday morning had dizzy spell, slurged speech, fatique--worried it may be meds or something else --- pls call her at [phone].

## 2018-02-01 NOTE — Telephone Encounter (Signed)
Called patient, she states that she has not taken her antidepressant medication in 5-6 days.  Our office does not prescribe this medication for her. Advised patient to contact office that manages this medication and that if she can not get through to them or if symptoms do not improve patient needs to go to the ED. MPulliam, CMA/RT(R)

## 2018-02-03 ENCOUNTER — Other Ambulatory Visit: Payer: Self-pay | Admitting: Podiatry

## 2018-02-13 ENCOUNTER — Telehealth: Payer: Self-pay | Admitting: Family Medicine

## 2018-02-13 ENCOUNTER — Other Ambulatory Visit: Payer: Self-pay | Admitting: Adult Health

## 2018-02-13 MED ORDER — FLUTICASONE PROPIONATE 50 MCG/ACT NA SUSP: 2.0000 | Freq: Every day | NASAL | 2 refills | Status: DC

## 2018-02-13 NOTE — Telephone Encounter (Signed)
Patient is requesting a prescription for generic Flonase, if approved please send to [facility] in [facility] [place]

## 2018-02-13 NOTE — Telephone Encounter (Signed)
Patient notified. MPulliam, CMA/RT(R)  

## 2018-02-13 NOTE — Telephone Encounter (Signed)
Called and spoke to patient.  She states that she has had issues with season allergies and has used flonase in the past, which helped.  Patient states that allergies have flare up in the last few weeks and states that she is doing saline rises, but her nose is still stopped up and dry.  Patient was last seen in the office on 01/01/2018. Please advise. MPulliam, CMA/RT(R)

## 2018-02-13 NOTE — Telephone Encounter (Signed)
Sent in Thanks! Ariel King  

## 2018-02-20 ENCOUNTER — Ambulatory Visit: Payer: BC Managed Care – PPO | Admitting: Podiatry

## 2018-02-25 ENCOUNTER — Encounter: Payer: Self-pay | Admitting: Podiatry

## 2018-02-25 ENCOUNTER — Ambulatory Visit (INDEPENDENT_AMBULATORY_CARE_PROVIDER_SITE_OTHER): Payer: Medicare Other

## 2018-02-25 ENCOUNTER — Ambulatory Visit (INDEPENDENT_AMBULATORY_CARE_PROVIDER_SITE_OTHER): Payer: Medicare Other | Admitting: Podiatry

## 2018-02-25 DIAGNOSIS — M779 Enthesopathy, unspecified: Secondary | ICD-10-CM

## 2018-02-25 DIAGNOSIS — M722 Plantar fascial fibromatosis: Secondary | ICD-10-CM

## 2018-02-25 MED ORDER — MELOXICAM 15 MG PO TABS: 15.0000 mg | ORAL_TABLET | Freq: Every day | ORAL | 3 refills | Status: DC

## 2018-02-25 NOTE — Progress Notes (Signed)
She presents today for follow-up of plantar fasciitis of her left foot states that is doing a little better the injection seemed to help some amenable to stand on hard floors or walk a lot is still hurts.  She also complaining of tenderness in the right Achilles area but denies any injury to this area.  Objective: Vital signs are stable she is alert and oriented x3.  Pulses are palpable.  Reproducible pain on palpation of the Achilles does not demonstrate any fluctuance.  There is no erythema cellulitis drainage or odor radiographs do not demonstrate anything in this area of the retrocalcaneal heel spur but no thickening of the soft tissues and no thickening of the Achilles.  Left foot does demonstrate pain on palpation medial calcaneal tubercle of the left heel.  Assessment: Resolving plantar fasciitis left minimal Achilles tendinitis right.  Plan: Reinjected the left heel today with Kenalog and local anesthetic 20 mg of Kenalog 5 mg of local anesthetic to the point of maximal tenderness left heel after sterile Betadine skin prep at the plantar fascial calcaneal insertion site.  No injection to the right foot.  I will follow-up with her in 6 weeks.  She will continue all other conservative therapies.

## 2018-04-03 ENCOUNTER — Other Ambulatory Visit: Payer: Self-pay | Admitting: Family Medicine

## 2018-04-03 ENCOUNTER — Ambulatory Visit (INDEPENDENT_AMBULATORY_CARE_PROVIDER_SITE_OTHER): Payer: Medicare Other | Admitting: Podiatry

## 2018-04-03 ENCOUNTER — Encounter: Payer: Self-pay | Admitting: Podiatry

## 2018-04-03 DIAGNOSIS — E039 Hypothyroidism, unspecified: Secondary | ICD-10-CM

## 2018-04-03 DIAGNOSIS — M7662 Achilles tendinitis, left leg: Secondary | ICD-10-CM

## 2018-04-03 DIAGNOSIS — E781 Pure hyperglyceridemia: Secondary | ICD-10-CM

## 2018-04-03 DIAGNOSIS — E559 Vitamin D deficiency, unspecified: Secondary | ICD-10-CM

## 2018-04-03 DIAGNOSIS — E669 Obesity, unspecified: Secondary | ICD-10-CM

## 2018-04-03 NOTE — Progress Notes (Signed)
She presents today for follow-up of plantar fasciitis of her left heel.  She states that is doing 100% better however my right Achilles tendon is really painful and swollen started to radiate up the back of my leg.  Denies any trauma to the foot.  Objective: Vital signs are stable she is alert and oriented x3 there is no calf pain.  Pulses remain palpable.  She has pain at the insertion site of the Achilles of the right heel.  Assessment: Plantar fasciitis of the left heel is resolving.  Achilles tendinitis insertional in nature right heel.  Plan: Discussed etiology pathology conservative versus surgical therapies.  Injected 2 mg dexamethasone subcutaneously over the most painful part of the Achilles tendon at its insertion site.  Making sure not to inject into the tendon this was injected with 2 mg of dexamethasone and local anesthetic after sterile Betadine skin prep.  Tolerated procedure well follow-up with me in 4 to 6 weeks.

## 2018-04-05 ENCOUNTER — Ambulatory Visit: Payer: BC Managed Care – PPO | Admitting: Sports Medicine

## 2018-04-09 ENCOUNTER — Telehealth: Payer: Self-pay | Admitting: Podiatry

## 2018-04-09 ENCOUNTER — Ambulatory Visit (INDEPENDENT_AMBULATORY_CARE_PROVIDER_SITE_OTHER): Payer: Medicare Other | Admitting: Podiatry

## 2018-04-09 DIAGNOSIS — M7661 Achilles tendinitis, right leg: Secondary | ICD-10-CM

## 2018-04-09 MED ORDER — MELOXICAM 15 MG PO TABS: 15.0000 mg | ORAL_TABLET | Freq: Every day | ORAL | 1 refills | Status: AC

## 2018-04-09 MED ORDER — METHYLPREDNISOLONE 4 MG PO TBPK: ORAL_TABLET | ORAL | 0 refills | Status: DC

## 2018-04-09 NOTE — Telephone Encounter (Signed)
I told pt both the Meloxicam and the medrol dose pack were called to the [facility] at 10:43am.

## 2018-04-09 NOTE — Telephone Encounter (Signed)
I saw Dr. Amalia Hailey this morning. Can you call me back about a prescription he was supposed to call in for me this morning? My number is [phone]. Thank you.

## 2018-04-10 ENCOUNTER — Ambulatory Visit: Payer: BC Managed Care – PPO | Admitting: Podiatry

## 2018-04-10 NOTE — Progress Notes (Signed)
   HPI: 47 year old female presenting today with a chief complaint of pain to the right foot. She reports associated pain in the achilles region of the RLE. She states the previous injection she received from Dr. Milinda Pointer provided no relief from the pain. Walking increases the pain. Patient is here for further evaluation and treatment.   Past Medical History:  Diagnosis Date  . Abnormal Pap smear   . Adult hypothyroidism 12/19/2015  . Anemia   . Anxiety   . Bladder disorder   . Chronic fatigue and immune dysfunction syndrome ([place])   . Chronic kidney disease   . Depression   . Depression   . EBV infection   . H/O bladder infections   . H/O joint problems   . Hx of migraines   . Incontinence   . Thyroid disease       Physical Exam: General: The patient is alert and oriented x3 in no acute distress.  Dermatology: Skin is warm, dry and supple bilateral lower extremities. Negative for open lesions or macerations.  Vascular: Palpable pedal pulses bilaterally. No edema or erythema noted. Capillary refill within normal limits.  Neurological: Epicritic and protective threshold grossly intact bilaterally.   Musculoskeletal Exam: Pain on palpation noted to the posterior tubercle of the right calcaneus at the insertion of the Achilles tendon consistent with retrocalcaneal bursitis. Range of motion within normal limits. Muscle strength 5/5 in all muscle groups bilateral lower extremities.  Radiographic Exam:  Posterior calcaneal spur noted to the respective calcaneus on lateral view. No fracture or dislocation noted. Normal osseous mineralization noted.     Assessment: 1. Insertional Achilles tendinitis right 2. Retrocalcaneal bursitis   Plan of Care:  1. Patient was evaluated. Radiographs were reviewed today. 2. Injection of 0.5 mL Celestone Soluspan injected into the retrocalcaneal bursa. Care was taken to avoid direct injection into the Achilles tendon. 3. Prescription for Medrol Dose  Pak provided to patient. Resume taking Meloxicam daily once completed.  4. CAM boot dispensed. Weightbearing as tolerated.  5. Return to clinic in 4 weeks.    Edrick Kins, DPM [facility]  Dr. Edrick Kins, [address]                                        [place], [place] [address]                Office [phone]  Fax [phone]

## 2018-04-17 ENCOUNTER — Ambulatory Visit: Payer: BC Managed Care – PPO | Admitting: Podiatry

## 2018-05-06 ENCOUNTER — Other Ambulatory Visit: Payer: Self-pay | Admitting: Family Medicine

## 2018-05-06 ENCOUNTER — Other Ambulatory Visit: Payer: Self-pay | Admitting: Adult Health

## 2018-05-06 DIAGNOSIS — E669 Obesity, unspecified: Secondary | ICD-10-CM

## 2018-05-06 DIAGNOSIS — E039 Hypothyroidism, unspecified: Secondary | ICD-10-CM

## 2018-05-06 DIAGNOSIS — E781 Pure hyperglyceridemia: Secondary | ICD-10-CM

## 2018-05-06 DIAGNOSIS — E559 Vitamin D deficiency, unspecified: Secondary | ICD-10-CM

## 2018-05-08 ENCOUNTER — Ambulatory Visit: Payer: BC Managed Care – PPO | Admitting: Podiatry

## 2018-05-10 ENCOUNTER — Encounter

## 2018-05-10 ENCOUNTER — Encounter: Payer: Self-pay | Admitting: Sports Medicine

## 2018-05-10 ENCOUNTER — Ambulatory Visit (INDEPENDENT_AMBULATORY_CARE_PROVIDER_SITE_OTHER): Payer: Medicare Other | Admitting: Sports Medicine

## 2018-05-10 DIAGNOSIS — M79671 Pain in right foot: Secondary | ICD-10-CM

## 2018-05-10 DIAGNOSIS — M7662 Achilles tendinitis, left leg: Secondary | ICD-10-CM

## 2018-05-10 DIAGNOSIS — S93402D Sprain of unspecified ligament of left ankle, subsequent encounter: Secondary | ICD-10-CM

## 2018-05-10 DIAGNOSIS — M79672 Pain in left foot: Secondary | ICD-10-CM

## 2018-05-10 DIAGNOSIS — M7661 Achilles tendinitis, right leg: Secondary | ICD-10-CM

## 2018-05-10 MED ORDER — DICLOFENAC SODIUM 75 MG PO TBEC
75.0000 mg | DELAYED_RELEASE_TABLET | Freq: Two times a day (BID) | ORAL | 0 refills | Status: DC
Start: 2018-05-10 — End: 2018-05-22

## 2018-05-10 MED ORDER — TRIAMCINOLONE ACETONIDE 10 MG/ML IJ SUSP: 10.0000 mg | Freq: Once | INTRAMUSCULAR | Status: DC

## 2018-05-10 MED ORDER — METHYLPREDNISOLONE 4 MG PO TBPK: ORAL_TABLET | ORAL | 0 refills | Status: DC

## 2018-05-10 NOTE — Progress Notes (Signed)
Subjective: Ariel King is a 47 y.o. female patient who presents to office for evaluation of Right> Left heel pain. Patient complains of progressive pain especially over the last year in the Right>Left foot at the Achilles. Ranks pain 8/10 and is now interferring with daily activities. Patient was getting better after her last treatment last month however has started to flare back up.  Describes the pain as a deep soreness especially with direct pressure to the backs of the heels and also states that she has noticed a little more swelling on the lateral side of her ankle and a little pain with turning her foot in.  Patient has tried steroid Dosepak which seemed like it helped however she is not sure if the meloxicam is offering her any help.  Patient states that she did wear her boot for a little bit and that seemed to help a little on the right however pain on the left as well. Patient denies any other pedal complaints.   Patient Active Problem List   Diagnosis Date Noted  . Chronic right-sided low back pain with right-sided sciatica 05/22/2017  . History of chlamydia 03/22/2017  . Muscle strain 03/22/2017  . Lumbar spine strain, initial encounter 03/22/2017  . Severe episode of recurrent major depressive disorder, without psychotic features ([place]) 03/05/2017  . Encounter for wellness examination 10/25/2016  . Panic attacks 06/09/2016  . Status post hysterectomy, left ovaries, took cervix. 06/09/2016  . Vitamin D deficiency 06/09/2016  . Adult hypothyroidism 12/19/2015  . Obesity (BMI 30-39.9) 11/03/2013  . Chronic pain syndrome 12/25/2012  . h/o Blood glucose elevated 12/25/2012  . h/o Hypertriglyceridemia 12/25/2012  . Other insomnia 12/25/2012  . Chronic fatigue, unspecified 12/25/2012  . Migraine without aura and without status migrainosus, not intractable 12/25/2012  . Anxiety 03/14/2012  . Migraines 03/14/2012  . Depression 03/14/2012  . Chronic fatigue disorder 03/14/2012     Current Outpatient Medications on File Prior to Visit  Medication Sig Dispense Refill  . ALPRAZolam (XANAX) 0.5 MG tablet Take 1-2 tablets by mouth every 6 (six) hours as needed.    Marland Kitchen amphetamine-dextroamphetamine (ADDERALL) 20 MG tablet Take 1 tablet by mouth 3 (three) times daily.    . B Complex Vitamins (VITAMIN-B COMPLEX) TABS Take 1 tablet by mouth daily.    . diclofenac sodium (VOLTAREN) 1 % GEL Apply 4 g topically 4 (four) times daily. 100 g 2  . fluticasone (FLONASE) 50 MCG/ACT nasal spray SPRAY 2 SPRAYS INTO EACH NOSTRILS EVERYDAY 16 g 2  . gabapentin (NEURONTIN) 300 MG capsule TAKE 1 CAPSULE BY MOUTH THREE TIMES A DAY    . ibuprofen (ADVIL,MOTRIN) 600 MG tablet Take 1 tablet (600 mg total) by mouth every 8 (eight) hours as needed for moderate pain (short term use only). 30 tablet 0  . levothyroxine (SYNTHROID, LEVOTHROID) 125 MCG tablet Take 1 tablet (125 mcg total) by mouth daily. 90 tablet 1  . methocarbamol (ROBAXIN) 500 MG tablet TAKE 1 TABLET (500 MG TOTAL) BY MOUTH NIGHTLY AS NEEDED  3  . niacin (NIASPAN) 1000 MG CR tablet TAKE 1 TABLET (1,000 MG TOTAL) BY MOUTH AT BEDTIME. PATIENT NOV 90 tablet 0  . QUEtiapine (SEROQUEL) 100 MG tablet Take 100 mg by mouth as needed.    . traZODone (DESYREL) 100 MG tablet Take 2 tablets by mouth at bedtime.    Marland Kitchen venlafaxine XR (EFFEXOR-XR) 75 MG 24 hr capsule Take 3 capsules by mouth every morning.    . Vitamin D, Ergocalciferol, (DRISDOL)  50000 units CAPS capsule Take 1 capsule (50,000 Units total) by mouth every 7 (seven) days. 12 capsule 1   No current facility-administered medications on file prior to visit.     Allergies  Allergen Reactions  . Naltrexone Other (See Comments)    Panic attack    Objective:  General: Alert and oriented x3 in no acute distress  Dermatology: No open lesions bilateral lower extremities, no webspace macerations, no ecchymosis bilateral, all nails x 10 are well manicured.  Vascular: Dorsalis Pedis  and Posterior Tibial pedal pulses 2/4, Capillary Fill Time 3 seconds, + pedal hair growth bilateral, no edema bilateral lower extremities, Temperature gradient within normal limits.  Neurology: Johney Maine sensation intact via light touch bilateral.  Musculoskeletal: Mild tenderness with palpation at insertion of the Achilles on Right>Left, there is calcaneal exostosis with mild soft tissue present and decreased ankle rom with knee extending  vs flexed resembling gastroc equnius bilateral, The achilles tendon feels intact with no nodularity or palpable dell, Thompson sign negative, Subtalar and midtarsal joint range of motion is within normal limits, there is no 1st ray hypermobility or forefoot deformity noted bilateral however there is mild pain with inversion of the left foot and ankle likely  compensation pain versus sprain.   Gait: Antalgic gait with increased heel off right>left.   Assessment and Plan: Problem List Items Addressed This Visit    None    Visit Diagnoses    Achilles tendinitis, right leg    -  Primary   Relevant Medications   methylPREDNISolone (MEDROL DOSEPAK) 4 MG TBPK tablet   diclofenac (VOLTAREN) 75 MG EC tablet   triamcinolone acetonide (KENALOG) 10 MG/ML injection 10 mg (Start on 05/10/2018  5:00 PM)   Tendonitis, Achilles, left       Relevant Medications   methylPREDNISolone (MEDROL DOSEPAK) 4 MG TBPK tablet   diclofenac (VOLTAREN) 75 MG EC tablet   triamcinolone acetonide (KENALOG) 10 MG/ML injection 10 mg (Start on 05/10/2018  5:00 PM)   Sprain of left ankle, unspecified ligament, subsequent encounter       Bilateral foot pain         -Complete examination performed -Previous xrays reviewed -Discussed treatement options -After oral consent and aseptic prep, injected a mixture containing 1 ml of 2%  plain lidocaine, 1 ml 0.5% plain marcaine, 0.5 ml of kenalog 10 and 0.5 ml of dexamethasone phosphate along the right medial Achilles and left lateral Achilles into the  inflamed bursa with care to not violate the tendon. Post-injection care discussed with patient.  -Rx diclofenac and Medrol Dosepak to take as instructed -Advised patient to resume using her cam boot on the left side and heel lift in good supportive shoe on the right to protect the Achilles especially postinjection -Patient to return to office in 4 weeks or sooner if condition worsens.  Advised patient if pain is not any better she will consider physical therapy versus EPAT.  Landis Martins, DPM

## 2018-05-21 ENCOUNTER — Ambulatory Visit (INDEPENDENT_AMBULATORY_CARE_PROVIDER_SITE_OTHER): Payer: Medicare Other | Admitting: Family Medicine

## 2018-05-21 ENCOUNTER — Encounter: Payer: Self-pay | Admitting: Family Medicine

## 2018-05-21 VITALS — BP 110/75 | HR 96 | Ht 64.0 in | Wt 182.5 lb

## 2018-05-21 DIAGNOSIS — E781 Pure hyperglyceridemia: Secondary | ICD-10-CM

## 2018-05-21 DIAGNOSIS — M7661 Achilles tendinitis, right leg: Secondary | ICD-10-CM

## 2018-05-21 DIAGNOSIS — J4520 Mild intermittent asthma, uncomplicated: Secondary | ICD-10-CM | POA: Diagnosis not present

## 2018-05-21 DIAGNOSIS — J45909 Unspecified asthma, uncomplicated: Secondary | ICD-10-CM | POA: Insufficient documentation

## 2018-05-21 DIAGNOSIS — Z1231 Encounter for screening mammogram for malignant neoplasm of breast: Secondary | ICD-10-CM

## 2018-05-21 DIAGNOSIS — E039 Hypothyroidism, unspecified: Secondary | ICD-10-CM | POA: Diagnosis not present

## 2018-05-21 DIAGNOSIS — E669 Obesity, unspecified: Secondary | ICD-10-CM

## 2018-05-21 DIAGNOSIS — Z716 Tobacco abuse counseling: Secondary | ICD-10-CM | POA: Diagnosis not present

## 2018-05-21 DIAGNOSIS — M7662 Achilles tendinitis, left leg: Secondary | ICD-10-CM | POA: Insufficient documentation

## 2018-05-21 DIAGNOSIS — E559 Vitamin D deficiency, unspecified: Secondary | ICD-10-CM

## 2018-05-21 DIAGNOSIS — Z720 Tobacco use: Secondary | ICD-10-CM | POA: Diagnosis not present

## 2018-05-21 MED ORDER — LEVOTHYROXINE SODIUM 125 MCG PO TABS: 125.0000 ug | ORAL_TABLET | Freq: Every day | ORAL | 3 refills | Status: DC

## 2018-05-21 MED ORDER — NIACIN ER (ANTIHYPERLIPIDEMIC) 1000 MG PO TBCR: 1000.0000 mg | EXTENDED_RELEASE_TABLET | Freq: Every day | ORAL | 3 refills | Status: DC

## 2018-05-21 MED ORDER — ALBUTEROL SULFATE HFA 108 (90 BASE) MCG/ACT IN AERS: 1.0000 | INHALATION_SPRAY | RESPIRATORY_TRACT | 2 refills | Status: DC | PRN

## 2018-05-21 MED ORDER — MONTELUKAST SODIUM 10 MG PO TABS: 10.0000 mg | ORAL_TABLET | Freq: Every day | ORAL | 3 refills | Status: DC

## 2018-05-21 NOTE — Patient Instructions (Signed)
Please think seriously about quitting smoking!  This is very important for your health and well being.   Smoking cessation instruction/counseling given:  counseled patient on the dangers of tobacco use, advised patient to stop smoking, and reviewed strategies to maximize success  Discussed with patient that there are multiple treatments to aid in quitting smoking, however I explained none will work unless pt really wants to quit  Please let us know in the future if you are interested and ready to quit.  You can also call 1-800-QUIT-NOW [phone]) for free smoking cessation counseling and support.     Also, please go online to www.heart.org (the American Heart Association website) and search "quit smoking ".     Or try the centers for disease control website at: https://www.schmidt.com/  Or, go to the "national cancer institute" web site of NIH:  http://benson.com/  There is a lot of great information on these websites for you to look over.      Want to Quit Smoking? FDA-Approved Products Can Help  Quitting smoking can be hard, but it is possible. In fact, every time you put out a cigarette is a new chance to try quitting again, according to the U.[place] Food and Drug Administration's newest tobacco education campaign, "Every Try Counts."   If you want to quit-almost 70 percent of adult smokers say they do-you may want to use a "smoking cessation" product proven to help. Data has shown that using FDA-approved cessation medicine can double your chance of quitting successfully.  Some products contain nicotine as an active ingredient and others do not. These products include over-the-counter (OTC) options like skin patches, lozenges, and gum, as well as prescription medicines.  Smoking cessation products are intended to help you quit smoking. They are regulated through the [facility] Center for Drug  Evaluation and Research, which ensures that the products are safe and effective and that their benefits outweigh any known associated risks.  The Benefits of Quitting Smoking No matter how much you smoke-or for how long-quitting will benefit you.  Not only will you lower your risk of getting various cancers, including lung cancer, you'll also reduce your chances of having heart disease, a stroke, emphysema, and other serious diseases. Quitting also will lower the risk of heart disease and lung cancer in nonsmokers who otherwise would be exposed to your secondhand smoke.  Although there are benefits to quitting at any age, it is important to quit as soon as possible so your body can begin to heal from the damage caused by smoking. For instance, 12 hours after you quit smoking the carbon monoxide level in your blood drops to normal. Carbon monoxide is harmful because it displaces oxygen in the blood and deprives your heart, brain, and other vital organs of oxygen.  What To Know About Smoking Cessation Products Understanding how smoking cessation products work-and what side effects they may cause-can help you determine which product may be best for you.  If you're considering one of these products, reading labels and talking to your pharmacist and other health care providers are good first steps to take.  You also can check the FDA's website for more information on each product at Drugs@FDA , where you can search for each product by name.  And remember to weigh each product's benefits and risks, among other considerations.  About Nicotine Replacement Therapy (NRT) Nicotine is the substance primarily responsible for causing addiction to tobacco products. Tobacco users who are addicted to nicotine are used to having nicotine in  their bodies.  As you try to quit smoking, you may have symptoms of nicotine withdrawal. When you quit, this withdrawal may cause symptoms like cravings, or urges, to smoke;  depression; trouble sleeping; irritability; anxiety; and increased appetite.  Nicotine withdrawal can discourage some smokers from continuing with a quit attempt. But the FDA has approved several smoking cessation products designed to help users gradually withdraw from smoking (that is, "wean" themselves from smoking) by using specific amounts of nicotine that decrease over time. This type of product is called a "nicotine replacement therapy" or NRT. It supplies nicotine in controlled amounts while sparing you from other chemicals found in tobacco products.  NRTs are available over the counter and by prescription. You should generally use them only for a short time to help you manage nicotine cravings and withdrawal. However, the FDA recognizes that some people may need to use these products longer to stay smoke-free. Talk to your health care provider to determine the best course of treatment for you.  Over-the-counter NRTs are approved for sale to people age 46 and older. They are available under various brand names and sometimes as generic products. They include:  - Skin patches (also called "transdermal nicotine patches"). These patches are placed on the skin, similar to how you would apply an adhesive bandage. - Chewing gum (also called "nicotine gum"). This gum must be chewed according to the labeled instructions to be effective. - Lozenges (also called "nicotine lozenges"). You use these products by dissolving them in your mouth. For over-the-counter products, it's important to follow the instructions on the Drug Facts Label (DFL) and to read the enclosed User's Guide for complete directions and other important information. Ask your health care provider if you have questions.  Currently, prescription nicotine replacement therapy is available only under the brand name Nicotrol, and is available both as a nasal spray and an oral inhaler. The products are FDA-approved only for use by adults.  If  you are under age 65 and want to quit smoking, talk to a health care professional about whether you should use nicotine replacement therapies.  Important Advice for People Considering Nicotine Replacement Therapy Women who are pregnant or breastfeeding should talk to their health care providers and use nicotine replacement products only if the health care providers approve.  Also talk to your health care provider before using these products if you have:  diabetes, heart disease, asthma, or stomach ulcers; had a recent heart attack; high blood pressure that is not controlled with medicine; a history of irregular heartbeat; or been prescribed medication to help you quit smoking. If you take prescription medication for depression or asthma, tell your health care provider if you are quitting smoking because he or she may need to change your prescription dose.  Stop using a nicotine replacement product and call your health care professional if you have any of the following symptoms: nausea; dizziness; weakness; vomiting; fast or irregular heartbeat; mouth problems with the lozenge or gum; or redness or swelling of the skin around the patch that does not go away.  About Prescription Cessation Medicines Without Nicotine  The FDA has approved two smoking cessation products that do not contain nicotine. They are Chantix (varenicline tartrate) and Zyban (buproprion hydrochloride). Both are available in tablet form and by prescription only.  Chantix acts at sites in the brain affected by nicotine by reducing the rewarding effects of nicotine. The precise way that Zyban helps with smoking cessation is unknown.  As with other  prescription products, the FDA has evaluated these medicines and found that the benefits outweigh the risks. For users taking these products, risks include changes in behavior, depressed mood, hostility, aggression, and suicidal thoughts or actions.  The most common side  effects of Chantix include nausea; constipation; gas; vomiting; and trouble sleeping or vivid, unusual, or strange dreams. Chantix also may change how you react to alcohol, so talk to your health care provider about your drinking habits (if you drink alcohol) and whether these habits need to change. Chantix is not recommended for people under the age of 1.  The most commonly observed side effects consistently associated with the use of Zyban are dry mouth and insomnia.  Because Zyban contains the same active ingredient as the antidepressant Wellbutrin (bupropion), the FDA encourages people who use Zyban-and those who are considering it-to talk to their health care providers about the risks of treatment with antidepressant medicines. Zyban has not been studied in children under the age of 67 and is not approved for use in children and teenagers.  Note: If your health care provider prescribes Chantix or Zyban, please read the product's patient medication guide in its entirety. These guides offer important information on side effects, risks, warnings, product ingredients, and what you should talk about with your health care provider before taking the products.  Finally, if you ever have any side effects related to any smoking cessation products, or have any other problems related to your treatment, the FDA would like to hear from you. Please consider making a voluntary and confidential report to the FDA's MedWatch program.  Updated: October 23, 2016     Behavior Modification Ideas for Weight Management  Weight management involves adopting a healthy lifestyle that includes a knowledge of nutrition and exercise, a positive attitude and the right kind of motivation. Internal motives such as better health, increased energy, self-esteem and personal control increase your chances of lifelong weight management success.  Remember to have realistic goals and think long-term success. Believe in yourself and  you can do it. The following information will give you ideas to help you meet your goals.  Control Your Home Environment  Eat only while sitting down at the kitchen or dining room table. Do not eat while watching television, reading, cooking, talking on the phone, standing at the refrigerator or working on the computer. Keep tempting foods out of the house - don't buy them. Keep tempting foods out of sight. Have low-calorie foods ready to eat. Unless you are preparing a meal, stay out of the kitchen. Have healthy snacks at your disposal, such as small pieces of fruit, vegetables, canned fruit, pretzels, low-fat string cheese and nonfat cottage cheese.  Control Your Work Environment  Do not eat at [employer] or keep tempting snacks at your desk. If you get hungry between meals, plan healthy snacks and bring them with you to work. During your breaks, go for a walk instead of eating. If you work around food, plan in advance the one item you will eat at mealtime. Make it inconvenient to nibble on food by chewing gum, sugarless candy or drinking water or another low-calorie beverage. Do not work through meals. Skipping meals slows down metabolism and may result in overeating at the next meal. If food is available for special occasions, either pick the healthiest item, nibble on low-fat snacks brought from home, don't have anything offered, choose one option and have a small amount, or have only a beverage.  Control Your Mealtime  Environment  Serve your plate of food at the stove or kitchen counter. Do not put the serving dishes on the table. If you do put dishes on the table, remove them immediately when finished eating. Fill half of your plate with vegetables, a quarter with lean protein and a quarter with starch. Use smaller plates, bowls and glasses. A smaller portion will look large when it is in a little dish. Politely refuse second helpings. When fixing your plate, limit portions of food to  one scoop/serving or less.   Daily Food Management  Replace eating with another activity that you will not associate with food. Wait 20 minutes before eating something you are craving. Drink a large glass of water or diet soda before eating. Always have a big glass or bottle of water to drink throughout the day. Avoid high-calorie add-ons such as cream with your coffee, butter, mayonnaise and salad dressings.  Shopping: Do not shop when hungry or tired. Shop from a list and avoid buying anything that is not on your list. If you must have tempting foods, buy individual-sized packages and try to find a lower-calorie alternative. Don't taste test in the store. Read food labels. Compare products to help you make the healthiest choices.  Preparation: Chew a piece of gum while cooking meals. Use a quarter teaspoon if you taste test your food. Try to only fix what you are going to eat, leaving yourself no chance for seconds. If you have prepared more food than you need, portion it into individual containers and freeze or refrigerate immediately. Don't snack while cooking meals.  Eating: Eat slowly. Remember it takes about 20 minutes for your stomach to send a message to your brain that it is full. Don't let fake hunger make you think you need more. The ideal way to eat is to take a bite, put your utensil down, take a sip of water, cut your next bite, take a bit, put your utensil down and so on. Do not cut your food all at one time. Cut only as needed. Take small bites and chew your food well. Stop eating for a minute or two at least once during a meal or snack. Take breaks to reflect and have conversation.  Cleanup and Leftovers: Label leftovers for a specific meal or snack. Freeze or refrigerate individual portions of leftovers. Do not clean up if you are still hungry.  Eating Out and Social Eating  Do not arrive hungry. Eat something light before the meal. Try to fill up on  low-calorie foods, such as vegetables and fruit, and eat smaller portions of the high-calorie foods. Eat foods that you like, but choose small portions. If you want seconds, wait at least 20 minutes after you have eaten to see if you are actually hungry or if your eyes are bigger than your stomach. Limit alcoholic beverages. Try a soda water with a twist of lime. Do not skip other meals in the day to save room for the special event.  At Restaurants: Order  la carte rather than buffet style. Order some vegetables or a salad for an appetizer instead of eating bread. If you order a high-calorie dish, share it with someone. Try an after-dinner mint with your coffee. If you do have dessert, share it with two or more people. Don't overeat because you do not want to waste food. Ask for a doggie bag to take extra food home. Tell the server to put half of your entree in a to go  bag before the meal is served to you. Ask for salad dressing, gravy or high-fat sauces on the side. Dip the tip of your fork in the dressing before each bite. If bread is served, ask for only one piece. Try it plain without butter or oil. At [employer] where oil and vinegar is served with bread, use only a small amount of oil and a lot of vinegar for dipping.  At a Friend's House: Offer to bring a dish, appetizer or dessert that is low in calories. Serve yourself small portions or tell the host that you only want a small amount. Stand or sit away from the snack table. Stay away from the kitchen or stay busy if you are near the food. Limit your alcohol intake.  At [employer] and Cafeterias: Cover most of your plate with lettuce and/or vegetables. Use a salad plate instead of a dinner plate. After eating, clear away your dishes before having coffee or tea.  Entertaining at Home: Explore low-fat, low-cholesterol cookbooks. Use single-serving foods like chicken breasts or hamburger patties. Prepare low-calorie  appetizers and desserts.   Holidays: Keep tempting foods out of sight. Decorate the house without using food. Have low-calorie beverages and foods on hand for guests. Allow yourself one planned treat a day. Don't skip meals to save up for the holiday feast. Eat regular, planned meals.   Exercise Well  Make exercise a priority and a planned activity in the day. If possible, walk the entire or part of the distance to work. Get an exercise buddy. Go for a walk with a colleague during one of your breaks, go to the gym, run or take a walk with a friend, walk in the mall with a shopping companion. Park at the end of the parking lot and walk to the store or office entrance. Always take the stairs all of the way or at least part of the way to your floor. If you have a desk job, walk around the office frequently. Do leg lifts while sitting at your desk. Do something outside on the weekends like going for a hike or a bike ride.   Have a Healthy Attitude  Make health your weight management priority. Be realistic. Have a goal to achieve a healthier you, not necessarily the lowest weight or ideal weight based on calculations or tables. Focus on a healthy eating style, not on dieting. Dieting usually lasts for a short amount of time and rarely produces long-term success. Think long term. You are developing new healthy behaviors to follow next month, in a year and in a decade.    This information is for educational purposes only and is not intended to replace the advice of your doctor or health care provider. We encourage you to discuss with your doctor any questions or concerns you may have.        Guidelines for Losing Weight   We want weight loss that will last so you should lose 1-2 pounds a week.  THAT IS IT! Please pick THREE things a month to change. Once it is a habit check off the item. Then pick another three items off the list to become habits.  If you are already doing a habit  on the list GREAT!  Cross that item off!  Don't drink your calories. Ie, alcohol, soda, fruit juice, and sweet tea.   Drink more water. Drink a glass when you feel hungry or before each meal.   Eat breakfast - Complex carb and protein (  likeDannon light and fit yogurt, oatmeal, fruit, eggs, [place] bacon).  Measure your cereal.  Eat no more than one cup a day. (ie Kashi)  Eat an apple a day.  Add a vegetable a day.  Try a new vegetable a month.  Use Pam! Stop using oil or butter to cook.  Don't finish your plate or use smaller plates.  Share your dessert.  Eat sugar free Jello for dessert or frozen grapes.  Don't eat 2-3 hours before bed.  Switch to whole wheat bread, pasta, and brown rice.  Make healthier choices when you eat out. No fries!  Pick baked chicken, NOT fried.  Don't forget to SLOW DOWN when you eat. It is not going anywhere.   Take the stairs.  Park far away in the parking lot  Lift soup cans (or weights) for 10 minutes while watching TV.  Walk at work for 10 minutes during break.  Walk outside 1 time a week with your friend, kids, dog, or significant other.  Start a walking group at church.  Walk the mall as much as you can tolerate.   Keep a food diary.  Weigh yourself daily.  Walk for 15 minutes 3 days per week.  Cook at home more often and eat out less. If life happens and you go back to old habits, it is okay.  Just start over. You can do it!  If you experience chest pain, get short of breath, or tired during the exercise, please stop immediately and inform your doctor.    Before you even begin to attack a weight-loss plan, it pays to remember this: You are not fat. You have fat. Losing weight isn't about blame or shame; it's simply another achievement to accomplish. Dieting is like any other skill-you have to buckle down and work at it. As long as you act in a smart, reasonable way, you'll ultimately get where you want to be. Here are some  weight loss pearls for you.   1. It's Not a Diet. It's a Lifestyle Thinking of a diet as something you're on and suffering through only for the short term doesn't work. To shed weight and keep it off, you need to make permanent changes to the way you eat. It's OK to indulge occasionally, of course, but if you cut calories temporarily and then revert to your old way of eating, you'll gain back the weight quicker than you can say yo-yo. Use it to lose it. Research shows that one of the best predictors of long-term weight loss is how many pounds you drop in the first month. For that reason, nutritionists often suggest being stricter for the first two weeks of your new eating strategy to build momentum. Cut out added sugar and alcohol and avoid unrefined carbs. After that, figure out how you can reincorporate them in a way that's healthy and maintainable.  2. There's a Right Way to Exercise Working out burns calories and fat and boosts your metabolism by building muscle. But those trying to lose weight are notorious for overestimating the number of calories they burn and underestimating the amount they take in. Unfortunately, your system is biologically programmed to hold on to extra pounds and that means when you start exercising, your body senses the deficit and ramps up its hunger signals. If you're not diligent, you'll eat everything you burn and then some. Use it, to lose it. Cardio gets all the exercise glory, but strength and interval training are the real heroes. They  help you build lean muscle, which in turn increases your metabolism and calorie-burning ability 3. Don't Overreact to Mild Hunger Some people have a hard time losing weight because of hunger anxiety. To them, being hungry is bad-something to be avoided at all costs-so they carry snacks with them and eat when they don't need to. Others eat because they're stressed out or bored. While you never want to get to the point of being ravenous  (that's when bingeing is likely to happen), a hunger pang, a craving, or the fact that it's 3:00 p.m. should not send you racing for the vending machine or obsessing about the energy bar in your purse. Ideally, you should put off eating until your stomach is growling and it's difficult to concentrate.  Use it to lose it. When you feel the urge to eat, use the HALT method. Ask yourself, Am I really hungry? Or am I angry or anxious, lonely or bored, or tired? If you're still not certain, try the apple test. If you're truly hungry, an apple should seem delicious; if it doesn't, something else is going on. Or you can try drinking water and making yourself busy, if you are still hungry try a healthy snack.  4. Not All Calories Are Created Equal The mechanics of weight loss are pretty simple: Take in fewer calories than you use for energy. But the kind of food you eat makes all the difference. Processed food that's high in saturated fat and refined starch or sugar can cause inflammation that disrupts the hormone signals that tell your brain you're full. The result: You eat a lot more.  Use it to lose it. Clean up your diet. Swap in whole, unprocessed foods, including vegetables, lean protein, and healthy fats that will fill you up and give you the biggest nutritional bang for your calorie buck. In a few weeks, as your brain starts receiving regular hunger and fullness signals once again, you'll notice that you feel less hungry overall and naturally start cutting back on the amount you eat.  5. Protein, Produce, and Plant-Based Fats Are Your Weight-Loss Trinity Here's why eating the three Ps regularly will help you drop pounds. Protein fills you up. You need it to build lean muscle, which keeps your metabolism humming so that you can torch more fat. People in a weight-loss program who ate double the recommended daily allowance for protein (about 110 grams for a 150-pound woman) lost 70 percent of their weight from  fat, while people who ate the RDA lost only about 40 percent, one study found. Produce is packed with filling fiber. "It's very difficult to consume too many calories if you're eating a lot of vegetables. Example: Three cups of broccoli is a lot of food, yet only 93 calories. (Fruit is another story. It can be easy to overeat and can contain a lot of calories from sugar, so be sure to monitor your intake.) Plant-based fats like olive oil and those in avocados and nuts are healthy and extra satiating.  Use it to lose it. Aim to incorporate each of the three Ps into every meal and snack. People who eat protein throughout the day are able to keep weight off, according to a study in the [place] of Clinical Nutrition. In addition to meat, poultry and seafood, good sources are beans, lentils, eggs, tofu, and yogurt. As for fat, keep portion sizes in check by measuring out salad dressing, oil, and nut butters (shoot for one to two tablespoons). Finally,  eat veggies or a little fruit at every meal. People who did that consumed 308 fewer calories but didn't feel any hungrier than when they didn't eat more produce.  7. How You Eat Is As Important As What You Eat In order for your brain to register that you're full, you need to focus on what you're eating. Sit down whenever you eat, preferably at a table. Turn off the TV or computer, put down your phone, and look at your food. Smell it. Chew slowly, and don't put another bite on your fork until you swallow. When women ate lunch this attentively, they consumed 30 percent less when snacking later than those who listened to an audiobook at lunchtime, according to a study in the [place] of [employer]. 8. Weighing Yourself Really Works The scale provides the best evidence about whether your efforts are paying off. Seeing the numbers tick up or down or stagnate is motivation to keep going-or to rethink your approach. A 2015 study at [facility] found  that daily weigh-ins helped people lose more weight, keep it off, and maintain that loss, even after two years. Use it to lose it. Step on the scale at the same time every day for the best results. If your weight shoots up several pounds from one weigh-in to the next, don't freak out. Eating a lot of salt the night before or having your period is the likely culprit. The number should return to normal in a day or two. It's a steady climb that you need to do something about. 9. Too Much Stress and Too Little Sleep Are Your Enemies When you're tired and frazzled, your body cranks up the production of cortisol, the stress hormone that can cause carb cravings. Not getting enough sleep also boosts your levels of ghrelin, a hormone associated with hunger, while suppressing leptin, a hormone that signals fullness and satiety. People on a diet who slept only five and a half hours a night for two weeks lost 55 percent less fat and were hungrier than those who slept eight and a half hours, according to a study in the [place]. Use it to lose it. Prioritize sleep, aiming for seven hours or more a night, which research shows helps lower stress. And make sure you're getting quality zzz's. If a snoring spouse or a fidgety cat wakes you up frequently throughout the night, you may end up getting the equivalent of just four hours of sleep, according to a study from [facility]. Keep pets out of the bedroom, and use a white-noise app to drown out snoring. 10. You Will Hit a plateau-And You Can Bust Through It As you slim down, your body releases much less leptin, the fullness hormone.  If you're not strength training, start right now. Building muscle can raise your metabolism to help you overcome a plateau. To keep your body challenged and burning calories, incorporate new moves and more intense intervals into your workouts or add another sweat session to your weekly routine. Alternatively,  cut an extra 100 calories or so a day from your diet. Now that you've lost weight, your body simply doesn't need as much fuel.    Since food equals calories, in order to lose weight you must either eat fewer calories, exercise more to burn off calories with activity, or both. Food that is not used to fuel the body is stored as fat. A major component of losing weight is to make smarter food choices. Here's  how:  1)   Limit non-nutritious foods, such as: Sugar, honey, syrups and candy Pastries, donuts, pies, cakes and cookies Soft drinks, sweetened juices and alcoholic beverages  2)  Cut down on high-fat foods by: - Choosing poultry, fish or lean red meat - Choosing low-fat cooking methods, such as baking, broiling, steaming, grilling and boiling - Using low-fat or non-fat dairy products - Using vinaigrette, herbs, lemon or fat-free salad dressings - Avoiding fatty meats, such as bacon, sausage, franks, ribs and luncheon meats - Avoiding high-fat snacks like nuts, chips and chocolate - Avoiding fried foods - Using less butter, margarine, oil and mayonnaise - Avoiding high-fat gravies, cream sauces and cream-based soups  3) Eat a variety of foods, including: - Fruit and vegetables that are raw, steamed or baked - Whole grains, breads, cereal, rice and pasta - Dairy products, such as low-fat or non-fat milk or yogurt, low-fat cottage cheese and low-fat cheese - Protein-rich foods like chicken, [place], fish, lean meat and legumes, or beans  4) Change your eating habits by: - Eat three balanced meals a day to help control your hunger - Watch portion sizes and eat small servings of a variety of foods - Choose low-calorie snacks - Eat only when you are hungry and stop when you are satisfied - Eat slowly and try not to perform other tasks while eating - Find other activities to distract you from food, such as walking, taking up a hobby or being involved in the community - Include regular  exercise in your daily routine ( minimum of 20 min of moderate-intensity exercise at least 5 days/week)  - Find a support group, if necessary, for emotional support in your weight loss journey           Easy ways to cut 100 calories   1. Eat your eggs with hot sauce OR salsa instead of cheese.  Eggs are great for breakfast, but many people consider eggs and cheese to be BFFs. Instead of cheese-1 oz. of cheddar has 114 calories-top your eggs with hot sauce, which contains no calories and helps with satiety and metabolism. Salsa is also a great option!!  2. Top your toast, waffles or pancakes with fresh berries instead of jelly or syrup. Half a cup of berries-fresh, frozen or thawed-has about 40 calories, compared with 2 tbsp. of maple syrup or jelly, which both have about 100 calories. The berries will also give you a good punch of fiber, which helps keep you full and satisfied and won't spike blood sugar quickly like the jelly or syrup. 3. Swap the non-fat latte for black coffee with a splash of half-and-half. Contrary to its name, that non-fat latte has 130 calories and a startling 19g of carbohydrates per 16 oz. serving. Replacing that 'light' drinkable dessert with a black coffee with a splash of half-and-half saves you more than 100 calories per 16 oz. serving. 4. Sprinkle salads with freeze-dried raspberries instead of dried cranberries. If you want a sweet addition to your nutritious salad, stay away from dried cranberries. They have a whopping 130 calories per  cup and 30g carbohydrates. Instead, sprinkle freeze-dried raspberries guilt-free and save more than 100 calories per  cup serving, adding 3g of belly-filling fiber. 5. Go for mustard in place of mayo on your sandwich. Mustard can add really nice flavor to any sandwich, and there are tons of varieties, from spicy to honey. A serving of mayo is 95 calories, versus 10 calories in a serving of mustard.  Or try an avocado mayo  spread: You can find the recipe few click this link: https://www.californiaavocado.com/recipes/recipe-container/[place]-avocado-mayo 6. Choose a DIY salad dressing instead of the store-bought kind. Mix Dijon or whole grain mustard with low-fat Kefir or red wine vinegar and garlic. 7. Use hummus as a spread instead of a dip. Use hummus as a spread on a high-fiber cracker or tortilla with a sandwich and save on calories without sacrificing taste. 8. Pick just one salad "accessory." Salad isn't automatically a calorie winner. It's easy to over-accessorize with toppings. Instead of topping your salad with nuts, avocado and cranberries (all three will clock in at 313 calories), just pick one. The next day, choose a different accessory, which will also keep your salad interesting. You don't wear all your jewelry every day, right? 9. Ditch the white pasta in favor of spaghetti squash. One cup of cooked spaghetti squash has about 40 calories, compared with traditional spaghetti, which comes with more than 200. Spaghetti squash is also nutrient-dense. It's a good source of fiber and Vitamins A and C, and it can be eaten just like you would eat pasta-with a great tomato sauce and [place] meatballs or with pesto, tofu and spinach, for example. 10. Dress up your chili, soups and stews with non-fat [place] yogurt instead of sour cream. Just a 'dollop' of sour cream can set you back 115 calories and a whopping 12g of fat-seven of which are of the artery-clogging variety. Added bonus: [place] yogurt is packed with muscle-building protein, calcium and B Vitamins. 11. Mash cauliflower instead of mashed potatoes. One cup of traditional mashed potatoes-in all their creamy goodness-has more than 200 calories, compared to mashed cauliflower, which you can typically eat for less than 100 calories per 1 cup serving. Cauliflower is a great source of the antioxidant indole-3-carbinol (I3C), which may help reduce the risk of some  cancers, like breast cancer. 12. Ditch the ice cream sundae in favor of a [place] yogurt parfait. Instead of a cup of ice cream or fro-yo for dessert, try 1 cup of nonfat Greek yogurt topped with fresh berries and a sprinkle of cacao nibs. Both toppings are packed with antioxidants, which can help reduce cellular inflammation and oxidative damage. And the comparison is a no-brainer: One cup of ice cream has about 275 calories; one cup of frozen yogurt has about 230; and a cup of Greek yogurt has just 130, plus twice the protein, so you're less likely to return to the freezer for a second helping. 13. Put olive oil in a spray container instead of using it directly from the bottle. Each tablespoon of olive oil is 120 calories and 15g of fat. Use a mister instead of pouring it straight into the pan or onto a salad. This allows for portion control and will save you more than 100 calories. 14. When baking, substitute canned pumpkin for butter or oil. Canned pumpkin-not pumpkin pie mix-is loaded with Vitamin A, which is important for skin and eye health, as well as immunity. And the comparisons are pretty crazy:  cup of canned pumpkin has about 40 calories, compared to butter or oil, which has more than 800 calories. Yes, 800 calories. Applesauce and mashed banana can also serve as good substitutions for butter or oil, usually in a 1:1 ratio. 15. Top casseroles with high-fiber cereal instead of breadcrumbs. Breadcrumbs are typically made with white bread, while breakfast cereals contain 5-9g of fiber per serving. Not only will you save more than 150 calories per  cup serving, the swap will also keep you more full and you'll get a metabolism boost from the added fiber. 16. Snack on pistachios instead of macadamia nuts. Believe it or not, you get the same amount of calories from 35 pistachios (100 calories) as you would from only five macadamia nuts. 17. Chow down on kale chips rather than potato chips. This is  my favorite 'don't knock it 'till you try it' swap. Kale chips are so easy to make at home, and you can spice them up with a little grated parmesan or chili powder. Plus, they're a mere fraction of the calories of potato chips, but with the same crunch factor we crave so often. 18. Add seltzer and some fruit slices to your cocktail instead of soda or fruit juice. One cup of soda or fruit juice can pack on as much as 140 calories. Instead, use seltzer and fruit slices. The fruit provides valuable phytochemicals, such as flavonoids and anthocyanins, which help to combat cancer and stave off the aging process.

## 2018-05-21 NOTE — Progress Notes (Signed)
Impression and Recommendations:    1. Adult hypothyroidism   2. Obesity (BMI 30-39.9)   3. Vitamin D deficiency   4. h/o Hypertriglyceridemia   5. Mild intermittent reactive airway disease with wheezing without complication   6. Tobacco consumption   7. Tobacco abuse counseling   8. Achilles tendinitis of both lower extremities   9. Breast cancer screening by mammogram     1. Mild Intermittent RAD with Wheezing w/o Complication - Singulair prescribed today.  Take every night at bedtime.  - Reviewed that albuterol should be used only as a rescue inhaler, less than 2 times per week.  - Advised patient that Singulair should alleviate her symptoms and lessen the need for rescue inhaler.  2. Vitamin D Deficiency - Continue supplementation as prescribed.  No changes made today.  3. H/o Hypertriglyceridemia - Continue Niacin as prescribed.  Will re-evaluate at next lab work.  4. Tight Achilles Tendon - Handout provided today on appropriate achilles tendon stretches and care.  - Advised patient to consult with podiatrist about her tendon health.  5. Hypothyroidism - Patient due for lab work.  - Continue medications as prescribed.  No changes made today.  - Synthroid refilled today.  6. Tobacco Abuse - Cigarettes Told pt to think seriously about quitting smoking!  Told pt it is very important for his/her health and well being.    Smoking cessation instruction/counseling given:  counseled patient on the dangers of tobacco use, advised patient to stop smoking, and reviewed strategies to maximize success  Discussed with patient that there are multiple treatments to aid in quitting smoking, however I explained none will work unless pt really want to quit  Told to call 1-800-QUIT-NOW (207) 446-3128) for free smoking cessation counseling and support, or pt can go online to www.heart.org - the American Heart Association website and search "quit smoking ".   7. BMI  Counseling Explained to patient what BMI refers to, and what it means medically.    Told patient to think about it as a "medical risk stratification measurement" and how increasing BMI is associated with increasing risk/ or worsening state of various diseases such as hypertension, hyperlipidemia, diabetes, premature OA, depression etc.  American Heart Association guidelines for healthy diet, basically Mediterranean diet, and exercise guidelines of 30 minutes 5 days per week or more discussed in detail.  Health counseling performed.  All questions answered.  8. Lifestyle & Preventative Health Maintenance - Advised patient to continue working toward exercising to improve overall mental, physical, and emotional health.    - Begin to engage in daily physical activity, 5 minutes of walking, slowly increasing as tolerated.  Recommended that the patient eventually strive for at least 150 minutes of moderate cardiovascular activity per week according to guidelines established by [facility].   - Healthy dietary habits encouraged, including low-carb, and high amounts of lean protein in diet.   - Patient should also consume adequate amounts of water - half of body weight in oz of water per day.  9. Follow-Up - Patient is not fasting today.  Fasting lab work will be drawn at patient's nearest convenience.  - Return in the near future for regularly scheduled chronic follow-up, 4-6 months.   - Her last CPE was February 2019.  - Patient would like a referral to mammography in [place].   Orders Placed This Encounter  Procedures  . MM DIGITAL SCREENING BILATERAL  . CBC with Differential/Platelet  . Comprehensive metabolic panel  .  Hemoglobin A1c  . Lipid panel  . TSH  . VITAMIN D 25 Hydroxy (Vit-D Deficiency, Fractures)  . T4, free  . Magnesium    Meds ordered this encounter  Medications  . levothyroxine (SYNTHROID, LEVOTHROID) 125 MCG tablet    Sig: Take 1 tablet (125 mcg total) by mouth  daily.    Dispense:  90 tablet    Refill:  3  . montelukast (SINGULAIR) 10 MG tablet    Sig: Take 1 tablet (10 mg total) by mouth at bedtime.    Dispense:  90 tablet    Refill:  3  . albuterol (PROVENTIL HFA;VENTOLIN HFA) 108 (90 Base) MCG/ACT inhaler    Sig: Inhale 1-2 puffs into the lungs every 4 (four) hours as needed for wheezing or shortness of breath.    Dispense:  1 Inhaler    Refill:  2  . niacin (NIASPAN) 1000 MG CR tablet    Sig: Take 1 tablet (1,000 mg total) by mouth at bedtime.    Dispense:  90 tablet    Refill:  3    Gross side effects, risk and benefits, and alternatives of medications and treatment plan in general discussed with patient.  Patient is aware that all medications have potential side effects and we are unable to predict every side effect or drug-drug interaction that may occur.   Patient will call with any questions prior to using medication if they have concerns.  Expresses verbal understanding and consents to current therapy and treatment regimen.  No barriers to understanding were identified.  Red flag symptoms and signs discussed in detail.  Patient expressed understanding regarding what to do in case of emergency\urgent symptoms  Please see AVS handed out to patient at the end of our visit for further patient instructions/ counseling done pertaining to today's office visit.   Return for f/up 4-68mo or sooner as needed.  Started Singulair and gave patient refill albuterol for her occasional wheeze due to RAD and actively smoking    Note: This note was prepared with assistance of Dragon voice recognition software. Occasional wrong-word or sound-a-like substitutions may have occurred due to the inherent limitations of voice recognition software.   This document serves as a record of services personally performed by Mellody Dance, DO. It was created on her behalf by Toni Amend, a trained medical scribe. The creation of this record is based on the  scribe's personal observations and the provider's statements to them.   I have reviewed the above medical documentation for accuracy and completeness and I concur.  Mellody Dance 05/21/18 10:44 AM   ------------------------------------------------------------------------------------------------------------------------------    Subjective:     HPI: Ariel King is a 47 y.o. female who presents to [place] [facility] today for issues as discussed below.  Patient states she drinks a ton of water all day.  States she's been a lot better; going to the foot doctor Dr. Cannon Kettle for injections.  States for a good year or more with the foot, she wasn't able to move a lot.  However, she's lost a lot of weight; on 01/01/2018, she weighed 200 lbs.  Down to 182 lbs today.  States "I'm just eating healthy."  Notes that [employer], and he cooks healthy.  She is not formally exercising, but is up and down all the time due to her recent move to [place], and still unpacking.  She's been tending to her flowers and excited to move out here where she can enjoy  the outdoors.  She has a pool and has been swimming.  Patient would like a referral to [place] to establish with mammography there.  Cigarette Smoking/Breathing Concerns Smoking about a pack per day; just started back up a year ago.  Only smoked for a couple of years prior, and last time she quit cold [place].  Says that she started smoking "when my 48 year old marriage went away and I had nobody."  Notes that her breathing issues are worse when it's really hot, dusty.  Says "sometimes my lungs will make noises, and if I do two puffs of that inhaler, it's better."  States she does this 2-3 times per week, "just whenever I hear it."  She feels wheezing in her chest before this occurs.  Patient states that she also takes sudafed once or twice daily, and applies a cream up her nose using a Q-tip to keep it  moist.  Hypothyroidism Patient feels that her energy is good, but wants to get her thyroid checked again.  Magnesium Use Patient is also curious about her magnesium levels.  States she's been taking magnesium and wants to have it checked to make sure she's not taking too much.  Started supplementation 2-3 weeks ago, and takes 1000 mg at night.  Notes "it does help me go to the bathroom."  Feels she's been taking too much because she doesn't want to be "running to the bathroom."   Wt Readings from Last 3 Encounters:  05/21/18 182 lb 8 oz (82.8 kg)  01/01/18 200 lb (90.7 kg)  03/22/17 161 lb 12.8 oz (73.4 kg)   BP Readings from Last 3 Encounters:  05/21/18 110/75  01/01/18 99/64  10/17/17 111/69   Pulse Readings from Last 3 Encounters:  05/21/18 96  01/01/18 (!) 103  10/17/17 100   BMI Readings from Last 3 Encounters:  05/21/18 31.33 kg/m  01/01/18 34.60 kg/m  03/22/17 27.99 kg/m     Patient Care Team    Relationship Specialty Notifications Start End  Mellody Dance, DO PCP - General Family Medicine  05/08/16   Chucky May, MD Consulting Physician Psychiatry  08/29/16   Garrel Ridgel, DPM Consulting Physician Podiatry  01/01/18   [facility] [place] [facility]  01/01/18    Comment: does yrly skin screening.      Dr.  Silver Huguenin, Hoyle Sauer, DO Consulting Physician Rehabilitation  01/01/18    Comment: spine and back pain  Delsa Bern, MD Consulting Physician Obstetrics and Gynecology  01/02/18      Patient Active Problem List   Diagnosis Date Noted  . Reactive airway disease with wheezing 05/21/2018  . Tobacco consumption 05/21/2018  . Tobacco abuse counseling 05/21/2018  . Achilles tendinitis of both lower extremities 05/21/2018  . Chronic right-sided low back pain with right-sided sciatica 05/22/2017  . History of chlamydia 03/22/2017  . Muscle strain 03/22/2017  . Lumbar spine strain, initial encounter 03/22/2017  . Severe episode  of recurrent major depressive disorder, without psychotic features ([place]) 03/05/2017  . Encounter for wellness examination 10/25/2016  . Panic attacks 06/09/2016  . Status post hysterectomy, left ovaries, took cervix. 06/09/2016  . Vitamin D deficiency 06/09/2016  . Adult hypothyroidism 12/19/2015  . Obesity (BMI 30-39.9) 11/03/2013  . Chronic pain syndrome 12/25/2012  . h/o Blood glucose elevated 12/25/2012  . h/o Hypertriglyceridemia 12/25/2012  . Other insomnia 12/25/2012  . Chronic fatigue, unspecified 12/25/2012  . Migraine without aura and without status migrainosus, not intractable 12/25/2012  .  Anxiety 03/14/2012  . Migraines 03/14/2012  . Depression 03/14/2012  . Chronic fatigue disorder 03/14/2012    Past Medical history, Surgical history, Family history, Social history, Allergies and Medications have been entered into the medical record, reviewed and changed as needed.    Current Meds  Medication Sig  . ALPRAZolam (XANAX) 0.5 MG tablet Take 1-2 tablets by mouth every 6 (six) hours as needed.  Marland Kitchen amphetamine-dextroamphetamine (ADDERALL) 20 MG tablet Take 1 tablet by mouth 3 (three) times daily.  . B Complex Vitamins (VITAMIN-B COMPLEX) TABS Take 1 tablet by mouth daily.  . diclofenac (VOLTAREN) 75 MG EC tablet Take 1 tablet (75 mg total) by mouth 2 (two) times daily.  . diclofenac sodium (VOLTAREN) 1 % GEL Apply 4 g topically 4 (four) times daily.  . fluticasone (FLONASE) 50 MCG/ACT nasal spray SPRAY 2 SPRAYS INTO EACH NOSTRILS EVERYDAY  . gabapentin (NEURONTIN) 300 MG capsule TAKE 1 CAPSULE BY MOUTH THREE TIMES A DAY  . ibuprofen (ADVIL,MOTRIN) 600 MG tablet Take 1 tablet (600 mg total) by mouth every 8 (eight) hours as needed for moderate pain (short term use only).  Marland Kitchen levothyroxine (SYNTHROID, LEVOTHROID) 125 MCG tablet Take 1 tablet (125 mcg total) by mouth daily.  . methocarbamol (ROBAXIN) 500 MG tablet TAKE 1 TABLET (500 MG TOTAL) BY MOUTH NIGHTLY AS NEEDED  .  methylPREDNISolone (MEDROL DOSEPAK) 4 MG TBPK tablet 6 day dose pack - take as directed  . niacin (NIASPAN) 1000 MG CR tablet Take 1 tablet (1,000 mg total) by mouth at bedtime.  Marland Kitchen QUEtiapine (SEROQUEL) 100 MG tablet Take 100 mg by mouth as needed.  . traZODone (DESYREL) 100 MG tablet Take 3 tablets by mouth at bedtime.   Marland Kitchen venlafaxine XR (EFFEXOR-XR) 75 MG 24 hr capsule Take 3 capsules by mouth every morning.  . Vitamin D, Ergocalciferol, (DRISDOL) 50000 units CAPS capsule Take 1 capsule (50,000 Units total) by mouth every 7 (seven) days.  . [DISCONTINUED] levothyroxine (SYNTHROID, LEVOTHROID) 125 MCG tablet Take 1 tablet (125 mcg total) by mouth daily.  . [DISCONTINUED] niacin (NIASPAN) 1000 MG CR tablet TAKE 1 TABLET (1,000 MG TOTAL) BY MOUTH AT BEDTIME. PATIENT NOV   Current Facility-Administered Medications for the 05/21/18 encounter (Office Visit) with Mellody Dance, DO  Medication  . triamcinolone acetonide (KENALOG) 10 MG/ML injection 10 mg    Allergies:  Allergies  Allergen Reactions  . Naltrexone Other (See Comments)    Panic attack     Review of Systems:  A fourteen system review of systems was performed and found to be positive as per HPI.   Objective:   Blood pressure 110/75, pulse 96, height 5\' 4"  (1.626 m), weight 182 lb 8 oz (82.8 kg), SpO2 98 %. Body mass index is 31.33 kg/m. General:  Well Developed, well nourished, appropriate for stated age.  Neuro:  Alert and oriented,  extra-ocular muscles intact  HEENT:  Normocephalic, atraumatic, neck supple, no carotid bruits appreciated  Skin:  no gross rash, warm, pink. Cardiac:  RRR, S1 S2 Respiratory:  Occasional scattered end expiratory wheezes anteriorly and posteriorly, bilateral.  No rhonchi, no rales.  Not using accessory muscles, speaking in full sentences- unlabored. Vascular:  Ext warm, no cyanosis apprec.; cap RF less 2 sec. Psych:  No HI/SI, judgement and insight good, Euthymic mood. Full Affect.

## 2018-05-22 ENCOUNTER — Other Ambulatory Visit: Payer: Self-pay | Admitting: Sports Medicine

## 2018-05-22 ENCOUNTER — Telehealth: Payer: Self-pay | Admitting: Family Medicine

## 2018-05-22 DIAGNOSIS — M7662 Achilles tendinitis, left leg: Secondary | ICD-10-CM

## 2018-05-22 DIAGNOSIS — M7661 Achilles tendinitis, right leg: Secondary | ICD-10-CM

## 2018-05-22 NOTE — Telephone Encounter (Signed)
Patient called and let VM that [facility] and need to go to her [facility] pharm (she did not specify which location). She states she would like a call back to confirm when this is completed.

## 2018-05-22 NOTE — Telephone Encounter (Signed)
Called and notified patient that all refills have been sent to [facility]. MPulliam, CMA/RT(R)

## 2018-05-29 ENCOUNTER — Other Ambulatory Visit: Payer: Medicare Other

## 2018-06-05 ENCOUNTER — Other Ambulatory Visit: Payer: Self-pay | Admitting: Sports Medicine

## 2018-06-05 DIAGNOSIS — M7661 Achilles tendinitis, right leg: Secondary | ICD-10-CM

## 2018-06-05 DIAGNOSIS — M7662 Achilles tendinitis, left leg: Secondary | ICD-10-CM

## 2018-06-10 ENCOUNTER — Telehealth: Payer: Self-pay | Admitting: Sports Medicine

## 2018-06-10 DIAGNOSIS — M7661 Achilles tendinitis, right leg: Secondary | ICD-10-CM

## 2018-06-10 DIAGNOSIS — M7662 Achilles tendinitis, left leg: Secondary | ICD-10-CM

## 2018-06-10 MED ORDER — DICLOFENAC SODIUM 75 MG PO TBEC: DELAYED_RELEASE_TABLET | ORAL | 0 refills | Status: DC

## 2018-06-10 NOTE — Addendum Note (Signed)
Addended by: Harriett Sine D on: 06/10/2018 09:49 AM   Modules accepted: Orders

## 2018-06-10 NOTE — Telephone Encounter (Signed)
Pt needs a medication refill

## 2018-06-10 NOTE — Telephone Encounter (Signed)
Pt states she needs the Diclofenac to get her to the appt 06/21/2018 when she rescheduled her appt due to family emergency.

## 2018-06-13 ENCOUNTER — Ambulatory Visit: Payer: BC Managed Care – PPO | Admitting: Sports Medicine

## 2018-06-21 ENCOUNTER — Ambulatory Visit: Payer: BC Managed Care – PPO | Admitting: Sports Medicine

## 2018-06-28 ENCOUNTER — Other Ambulatory Visit: Payer: Self-pay | Admitting: Sports Medicine

## 2018-06-28 ENCOUNTER — Telehealth: Payer: Self-pay | Admitting: Sports Medicine

## 2018-06-28 DIAGNOSIS — M7661 Achilles tendinitis, right leg: Secondary | ICD-10-CM

## 2018-06-28 DIAGNOSIS — M7662 Achilles tendinitis, left leg: Secondary | ICD-10-CM

## 2018-06-28 MED ORDER — DICLOFENAC SODIUM 75 MG PO TBEC
DELAYED_RELEASE_TABLET | ORAL | 1 refills | Status: DC
Start: 2018-06-28 — End: 2018-08-02

## 2018-06-28 NOTE — Telephone Encounter (Signed)
Sent to [facility] in [place]

## 2018-06-28 NOTE — Telephone Encounter (Signed)
Dr. Cannon Kettle please advise 2nd resch and 2nd request for refill since 06/21/18

## 2018-06-28 NOTE — Progress Notes (Signed)
Sent Diclofenac refill. I assumed she wanted it to be sent to [facility] in [place], [place] since she was away. Patient to reschedule her follow up appointment once she returns. -Dr. Cannon Kettle

## 2018-06-28 NOTE — Telephone Encounter (Signed)
Called and notified pt that Dr. Cannon Kettle authorized her refill

## 2018-06-28 NOTE — Progress Notes (Signed)
Called and notified patient Dr Cannon Kettle authorized her refill and that is was sent to the correct pharmacy.

## 2018-06-28 NOTE — Telephone Encounter (Signed)
I need a refill on the diclofenac sodium 75 mg. I take 2 a day. [place]'s father had to have open heart surgery. I'm going to be running that and I'm going to call the line to reschedule my appointment that I have for next because I won't be coming home until the following Wednesday for a few days. My number is [phone]. Thank you.

## 2018-07-04 ENCOUNTER — Ambulatory Visit: Payer: BC Managed Care – PPO | Admitting: Sports Medicine

## 2018-07-11 ENCOUNTER — Ambulatory Visit: Payer: BC Managed Care – PPO | Admitting: Sports Medicine

## 2018-07-26 ENCOUNTER — Other Ambulatory Visit: Payer: Self-pay | Admitting: Sports Medicine

## 2018-07-26 DIAGNOSIS — M7662 Achilles tendinitis, left leg: Secondary | ICD-10-CM

## 2018-07-26 DIAGNOSIS — M7661 Achilles tendinitis, right leg: Secondary | ICD-10-CM

## 2018-08-02 ENCOUNTER — Encounter: Payer: Self-pay | Admitting: Sports Medicine

## 2018-08-02 ENCOUNTER — Ambulatory Visit (INDEPENDENT_AMBULATORY_CARE_PROVIDER_SITE_OTHER): Payer: Medicare Other | Admitting: Sports Medicine

## 2018-08-02 DIAGNOSIS — M7661 Achilles tendinitis, right leg: Secondary | ICD-10-CM

## 2018-08-02 DIAGNOSIS — S93402D Sprain of unspecified ligament of left ankle, subsequent encounter: Secondary | ICD-10-CM

## 2018-08-02 DIAGNOSIS — M79671 Pain in right foot: Secondary | ICD-10-CM

## 2018-08-02 DIAGNOSIS — M7662 Achilles tendinitis, left leg: Secondary | ICD-10-CM

## 2018-08-02 DIAGNOSIS — M79672 Pain in left foot: Secondary | ICD-10-CM

## 2018-08-02 MED ORDER — TRIAMCINOLONE ACETONIDE 10 MG/ML IJ SUSP: 10.0000 mg | Freq: Once | INTRAMUSCULAR | Status: DC

## 2018-08-02 MED ORDER — DICLOFENAC SODIUM 75 MG PO TBEC: DELAYED_RELEASE_TABLET | ORAL | 1 refills | Status: DC

## 2018-08-02 NOTE — Patient Instructions (Signed)
[facility]  Massage therapist in [place], [place]  Address: [address] [place], [place], [place] [address]  Phone: [phone]

## 2018-08-02 NOTE — Progress Notes (Signed)
Subjective: Ariel King is a 47 y.o. female patient who presents to office for evaluation of Right> Left heel pain. Patient complains of continued pain and both heels states that sometimes the pain is so bad that she can barely put any pressure on her heels when she is lying down in bed.  Patient states that pain is about a 7 out of 10 is better than before however she has not had injections in a long time so she feels like her heels have flared up states that the steroid by mouth and her diclofenac medicine has helped however she is out of her anti-inflammatory.  Patient states that she has been trying more natural supplements to help with inflammation and pain across the tendons and has been compliant with taking these vitamins for the last 2 weeks and has noticed a slight difference.  Patient denies any other pedal complaints.   Patient Active Problem List   Diagnosis Date Noted  . Reactive airway disease with wheezing 05/21/2018  . Tobacco consumption 05/21/2018  . Tobacco abuse counseling 05/21/2018  . Achilles tendinitis of both lower extremities 05/21/2018  . Chronic right-sided low back pain with right-sided sciatica 05/22/2017  . History of chlamydia 03/22/2017  . Muscle strain 03/22/2017  . Lumbar spine strain, initial encounter 03/22/2017  . Severe episode of recurrent major depressive disorder, without psychotic features ([place]) 03/05/2017  . Encounter for wellness examination 10/25/2016  . Panic attacks 06/09/2016  . Status post hysterectomy, left ovaries, took cervix. 06/09/2016  . Vitamin D deficiency 06/09/2016  . Adult hypothyroidism 12/19/2015  . Obesity (BMI 30-39.9) 11/03/2013  . Chronic pain syndrome 12/25/2012  . h/o Blood glucose elevated 12/25/2012  . h/o Hypertriglyceridemia 12/25/2012  . Other insomnia 12/25/2012  . Chronic fatigue, unspecified 12/25/2012  . Migraine without aura and without status migrainosus, not intractable 12/25/2012  . Anxiety 03/14/2012   . Migraines 03/14/2012  . Depression 03/14/2012  . Chronic fatigue disorder 03/14/2012    Current Outpatient Medications on File Prior to Visit  Medication Sig Dispense Refill  . albuterol (PROVENTIL HFA;VENTOLIN HFA) 108 (90 Base) MCG/ACT inhaler Inhale 1-2 puffs into the lungs every 4 (four) hours as needed for wheezing or shortness of breath. 1 Inhaler 2  . ALPRAZolam (XANAX) 0.5 MG tablet Take 1-2 tablets by mouth every 6 (six) hours as needed.    Marland Kitchen amphetamine-dextroamphetamine (ADDERALL) 20 MG tablet Take 1 tablet by mouth 3 (three) times daily.    . B Complex Vitamins (VITAMIN-B COMPLEX) TABS Take 1 tablet by mouth daily.    . diclofenac sodium (VOLTAREN) 1 % GEL Apply 4 g topically 4 (four) times daily. 100 g 2  . fluticasone (FLONASE) 50 MCG/ACT nasal spray SPRAY 2 SPRAYS INTO EACH NOSTRILS EVERYDAY 16 g 2  . gabapentin (NEURONTIN) 300 MG capsule TAKE 1 CAPSULE BY MOUTH THREE TIMES A DAY    . ibuprofen (ADVIL,MOTRIN) 600 MG tablet Take 1 tablet (600 mg total) by mouth every 8 (eight) hours as needed for moderate pain (short term use only). 30 tablet 0  . levothyroxine (SYNTHROID, LEVOTHROID) 125 MCG tablet Take 1 tablet (125 mcg total) by mouth daily. 90 tablet 3  . methocarbamol (ROBAXIN) 500 MG tablet TAKE 1 TABLET (500 MG TOTAL) BY MOUTH NIGHTLY AS NEEDED  3  . methylPREDNISolone (MEDROL DOSEPAK) 4 MG TBPK tablet 6 day dose pack - take as directed 21 tablet 0  . montelukast (SINGULAIR) 10 MG tablet Take 1 tablet (10 mg total) by mouth at  bedtime. 90 tablet 3  . niacin (NIASPAN) 1000 MG CR tablet Take 1 tablet (1,000 mg total) by mouth at bedtime. 90 tablet 3  . QUEtiapine (SEROQUEL) 100 MG tablet Take 100 mg by mouth as needed.    . traZODone (DESYREL) 100 MG tablet Take 3 tablets by mouth at bedtime.     Marland Kitchen venlafaxine XR (EFFEXOR-XR) 75 MG 24 hr capsule Take 3 capsules by mouth every morning.    . Vitamin D, Ergocalciferol, (DRISDOL) 50000 units CAPS capsule Take 1 capsule  (50,000 Units total) by mouth every 7 (seven) days. 12 capsule 1   Current Facility-Administered Medications on File Prior to Visit  Medication Dose Route Frequency Provider Last Rate Last Dose  . triamcinolone acetonide (KENALOG) 10 MG/ML injection 10 mg  10 mg Other Once Landis Martins, DPM        Allergies  Allergen Reactions  . Naltrexone Other (See Comments)    Panic attack    Objective:  General: Alert and oriented x3 in no acute distress.  Patient has her 30-month old granddaughter in the room with her this visit.  Dermatology: No open lesions bilateral lower extremities, no webspace macerations, no ecchymosis bilateral, all nails x 10 are well manicured.  Vascular: Dorsalis Pedis and Posterior Tibial pedal pulses 2/4, Capillary Fill Time 3 seconds, + pedal hair growth bilateral, no edema bilateral lower extremities, Temperature gradient within normal limits.  Neurology: Johney Maine sensation intact via light touch bilateral.  Musculoskeletal: Mild tenderness with palpation at insertion of the Achilles on Right>Left, there is calcaneal exostosis with mild soft tissue present and decreased ankle rom with knee extending  vs flexed resembling gastroc equnius bilateral, The achilles tendon feels intact with no nodularity or palpable dell, Thompson sign negative, Subtalar and midtarsal joint range of motion is within normal limits, there is no 1st ray hypermobility or forefoot deformity noted bilateral however there is mild pain with inversion of the left foot and ankle likely compensation pain versus sprain with an awkward pain across the area likely supportive of neuritis secondary to sprain.   Assessment and Plan: Problem List Items Addressed This Visit    None    Visit Diagnoses    Achilles tendinitis, right leg    -  Primary   Relevant Medications   diclofenac (VOLTAREN) 75 MG EC tablet   triamcinolone acetonide (KENALOG) 10 MG/ML injection 10 mg (Start on 08/02/2018 12:30 PM)    Tendonitis, Achilles, left       Relevant Medications   diclofenac (VOLTAREN) 75 MG EC tablet   Sprain of left ankle, unspecified ligament, subsequent encounter       Bilateral foot pain         -Complete examination performed -Previous xrays reviewed -Discussed treatement options -After oral consent and aseptic prep, injected a mixture containing 1 ml of 2%  plain lidocaine, 1 ml 0.5% plain marcaine, 0.5 ml of kenalog 10 and 0.5 ml of dexamethasone phosphate along the right medial Achilles  into the inflamed bursa with care to not violate the tendon. Post-injection care discussed with patient.  Did not inject the left today since patient has not been wearing her cam boot and did not want to risk the chance of Achilles tendon rupture -Advised patient that the pain that she has on the lateral side of her foot and ankle is likely from sprain and to continue with ankle support and compression garment to prevent worsening of symptoms on the lateral side of her left foot and  ankle -Refill diclofenac to take as instructed -Prescription given for physical therapy at [facility] and advised patient to take prescription to call for appointment patient is also interested in therapeutic massage so I gave her the contact for [facility] in [place] to arrange a medical massage -Advised good supportive shoes with heel lift -Continue with supplements of tumeric, glucosamine, collagen powder, and vitamin E -Patient to return to office in 4 weeks or sooner if condition worsens.  Advised patient if pain is not any better she will consider EPAT versus surgery.  Landis Martins, DPM

## 2018-08-12 ENCOUNTER — Telehealth: Payer: Self-pay | Admitting: Sports Medicine

## 2018-08-12 ENCOUNTER — Other Ambulatory Visit: Payer: Self-pay | Admitting: Family Medicine

## 2018-08-12 DIAGNOSIS — Z1231 Encounter for screening mammogram for malignant neoplasm of breast: Secondary | ICD-10-CM

## 2018-08-12 NOTE — Telephone Encounter (Signed)
Dr. Cannon Kettle wants me to see a medical massage therapist. She recommended one in [place] and I didn't know if she had one in [place]. My number is [phone]. Thank you.

## 2018-08-12 NOTE — Telephone Encounter (Signed)
I don't know one in [place]. The only placed I recommended was [facility]

## 2018-08-12 NOTE — Telephone Encounter (Signed)
Dr. Stover please advise 

## 2018-08-13 ENCOUNTER — Other Ambulatory Visit: Payer: Self-pay

## 2018-08-13 DIAGNOSIS — E781 Pure hyperglyceridemia: Secondary | ICD-10-CM

## 2018-08-13 DIAGNOSIS — E669 Obesity, unspecified: Secondary | ICD-10-CM

## 2018-08-13 DIAGNOSIS — E039 Hypothyroidism, unspecified: Secondary | ICD-10-CM

## 2018-08-13 DIAGNOSIS — E559 Vitamin D deficiency, unspecified: Secondary | ICD-10-CM

## 2018-08-13 MED ORDER — NIACIN ER (ANTIHYPERLIPIDEMIC) 1000 MG PO TBCR: 1000.0000 mg | EXTENDED_RELEASE_TABLET | Freq: Every day | ORAL | 3 refills | Status: DC

## 2018-08-13 NOTE — Telephone Encounter (Signed)
Pharmacy requested refill on Naicin

## 2018-08-14 NOTE — Telephone Encounter (Signed)
I had called on Monday. Dr. Cannon Kettle had recommended a medical massage place in [place] and I was wondering if there was one in [place] she would recommend. Thank you. My number is [phone].

## 2018-08-14 NOTE — Telephone Encounter (Signed)
I called the pt back and left a voicemail. I told her that Dr. Cannon Kettle does not know of a place in [place] and that she only recommends [facility]. I told the pt to call us back with any other questions at [phone].

## 2018-08-14 NOTE — Telephone Encounter (Signed)
I need to speak with a nurse in regards to some questions I have. Dr. Cannon Kettle referred me to [facility] a little over a week ago. I just need to know how often I need to go? Also, can y'all send information to them? My number is [phone]. Thank you so much.

## 2018-08-30 ENCOUNTER — Ambulatory Visit: Payer: BC Managed Care – PPO | Admitting: Sports Medicine

## 2018-09-05 ENCOUNTER — Encounter: Payer: Self-pay | Admitting: Sports Medicine

## 2018-09-05 ENCOUNTER — Ambulatory Visit (INDEPENDENT_AMBULATORY_CARE_PROVIDER_SITE_OTHER): Payer: Medicare Other | Admitting: Sports Medicine

## 2018-09-05 DIAGNOSIS — M7661 Achilles tendinitis, right leg: Secondary | ICD-10-CM

## 2018-09-05 DIAGNOSIS — M7662 Achilles tendinitis, left leg: Secondary | ICD-10-CM

## 2018-09-05 DIAGNOSIS — M79672 Pain in left foot: Secondary | ICD-10-CM

## 2018-09-05 DIAGNOSIS — M79671 Pain in right foot: Secondary | ICD-10-CM

## 2018-09-05 MED ORDER — CELECOXIB 200 MG PO CAPS: 200.0000 mg | ORAL_CAPSULE | Freq: Two times a day (BID) | ORAL | 1 refills | Status: DC

## 2018-09-05 NOTE — Progress Notes (Signed)
Subjective: Ariel King is a 47 y.o. female patient who presents to office for evaluation of Right= Left heel pain. Patient complains of continued pain and both heels states that pain is about a 8 out of 10 that comes and goes.  Patient states that her therapeutic massage and compression sleeve seem to help the pain and swelling reports that she is taking her diclofenac but cannot tell a difference in questions if there is anything else that she can take for inflammation.  Patient denies any other pedal complaints.   Patient Active Problem List   Diagnosis Date Noted  . Reactive airway disease with wheezing 05/21/2018  . Tobacco consumption 05/21/2018  . Tobacco abuse counseling 05/21/2018  . Achilles tendinitis of both lower extremities 05/21/2018  . Chronic right-sided low back pain with right-sided sciatica 05/22/2017  . History of chlamydia 03/22/2017  . Muscle strain 03/22/2017  . Lumbar spine strain, initial encounter 03/22/2017  . Severe episode of recurrent major depressive disorder, without psychotic features ([place]) 03/05/2017  . Encounter for wellness examination 10/25/2016  . Panic attacks 06/09/2016  . Status post hysterectomy, left ovaries, took cervix. 06/09/2016  . Vitamin D deficiency 06/09/2016  . Adult hypothyroidism 12/19/2015  . Obesity (BMI 30-39.9) 11/03/2013  . Chronic pain syndrome 12/25/2012  . h/o Blood glucose elevated 12/25/2012  . h/o Hypertriglyceridemia 12/25/2012  . Other insomnia 12/25/2012  . Chronic fatigue, unspecified 12/25/2012  . Migraine without aura and without status migrainosus, not intractable 12/25/2012  . Anxiety 03/14/2012  . Migraines 03/14/2012  . Depression 03/14/2012  . Chronic fatigue disorder 03/14/2012    Current Outpatient Medications on File Prior to Visit  Medication Sig Dispense Refill  . albuterol (PROVENTIL HFA;VENTOLIN HFA) 108 (90 Base) MCG/ACT inhaler Inhale 1-2 puffs into the lungs every 4 (four) hours as needed  for wheezing or shortness of breath. 1 Inhaler 2  . ALPRAZolam (XANAX) 0.5 MG tablet Take 1-2 tablets by mouth every 6 (six) hours as needed.    Marland Kitchen amphetamine-dextroamphetamine (ADDERALL) 20 MG tablet Take 1 tablet by mouth 3 (three) times daily.    . B Complex Vitamins (VITAMIN-B COMPLEX) TABS Take 1 tablet by mouth daily.    . diclofenac (VOLTAREN) 75 MG EC tablet TAKE 1 TABLET(75 MG) BY MOUTH TWICE DAILY 30 tablet 1  . diclofenac sodium (VOLTAREN) 1 % GEL Apply 4 g topically 4 (four) times daily. 100 g 2  . fluticasone (FLONASE) 50 MCG/ACT nasal spray SPRAY 2 SPRAYS INTO EACH NOSTRILS EVERYDAY 16 g 2  . gabapentin (NEURONTIN) 300 MG capsule TAKE 1 CAPSULE BY MOUTH THREE TIMES A DAY    . ibuprofen (ADVIL,MOTRIN) 600 MG tablet Take 1 tablet (600 mg total) by mouth every 8 (eight) hours as needed for moderate pain (short term use only). 30 tablet 0  . levothyroxine (SYNTHROID, LEVOTHROID) 125 MCG tablet Take 1 tablet (125 mcg total) by mouth daily. 90 tablet 3  . methocarbamol (ROBAXIN) 500 MG tablet TAKE 1 TABLET (500 MG TOTAL) BY MOUTH NIGHTLY AS NEEDED  3  . methylPREDNISolone (MEDROL DOSEPAK) 4 MG TBPK tablet 6 day dose pack - take as directed 21 tablet 0  . montelukast (SINGULAIR) 10 MG tablet Take 1 tablet (10 mg total) by mouth at bedtime. 90 tablet 3  . niacin (NIASPAN) 1000 MG CR tablet Take 1 tablet (1,000 mg total) by mouth at bedtime. 90 tablet 3  . QUEtiapine (SEROQUEL) 100 MG tablet Take 100 mg by mouth as needed.    Marland Kitchen  traZODone (DESYREL) 100 MG tablet Take 3 tablets by mouth at bedtime.     Marland Kitchen venlafaxine XR (EFFEXOR-XR) 75 MG 24 hr capsule Take 3 capsules by mouth every morning.    . Vitamin D, Ergocalciferol, (DRISDOL) 50000 units CAPS capsule Take 1 capsule (50,000 Units total) by mouth every 7 (seven) days. 12 capsule 1   Current Facility-Administered Medications on File Prior to Visit  Medication Dose Route Frequency Provider Last Rate Last Dose  . triamcinolone acetonide  (KENALOG) 10 MG/ML injection 10 mg  10 mg Other Once Landis Martins, DPM      . triamcinolone acetonide (KENALOG) 10 MG/ML injection 10 mg  10 mg Other Once Landis Martins, DPM        Allergies  Allergen Reactions  . Naltrexone Other (See Comments)    Panic attack    Objective:  General: Alert and oriented x3 in no acute distress.  Patient has her 55-month old granddaughter in the room with her this visit.  Dermatology: No open lesions bilateral lower extremities, no webspace macerations, no ecchymosis bilateral, all nails x 10 are well manicured.  Vascular: Dorsalis Pedis and Posterior Tibial pedal pulses 2/4, Capillary Fill Time 3 seconds, + pedal hair growth bilateral, no edema bilateral lower extremities, Temperature gradient within normal limits.  Neurology: Johney Maine sensation intact via light touch bilateral.  Musculoskeletal: Mild tenderness with palpation at insertion of the Achilles on the right and left at the lateral aspect greater than the medial or central aspect, there is calcaneal exostosis with mild soft tissue present and decreased ankle rom with knee extending  vs flexed resembling gastroc equnius bilateral, The achilles tendon feels intact with no nodularity or palpable dell, Thompson sign negative, Subtalar and midtarsal joint range of motion is within normal limits, there is no 1st ray hypermobility or forefoot deformity noted bilateraL  Assessment and Plan: Problem List Items Addressed This Visit    None    Visit Diagnoses    Achilles tendinitis, right leg    -  Primary   Tendonitis, Achilles, left       Bilateral foot pain       Relevant Orders   DME Other see comment     -Complete examination performed -Discussed continued care for tendinitis -After oral consent and aseptic prep, injected a mixture containing 1 ml of 2%  plain lidocaine, 1 ml 0.5% plain marcaine, 0.5 ml of kenalog 10 and 0.5 ml of dexamethasone phosphate along the right and left lateral   Achilles into the inflamed bursa with care to not violate the tendon. Post-injection care discussed with patient. -Dispensed heel lifts and advised protection when ambulating and gave a prescription for cane -Change diclofenac to Celebrex to see patient get any additional relief -Patient to continue with therapeutic massage and PT -Advised good supportive shoes with heel lift as dispensed at today's visit -Continue with supplements of tumeric, glucosamine, collagen powder, and vitamin E as tolerated -Patient to return to office if no better after 4 weeks.  Advised patient if pain is not any better she will consider EPAT versus surgery.  Landis Martins, DPM

## 2018-09-09 ENCOUNTER — Telehealth: Payer: Self-pay | Admitting: Sports Medicine

## 2018-09-09 NOTE — Telephone Encounter (Signed)
I was there on Thursday or Friday and picked up a prescription for a cane. I cannot remember the place she said to take it to. If you could call me back at [phone] as I'm getting ready to leave the house.

## 2018-09-17 ENCOUNTER — Telehealth: Payer: Self-pay | Admitting: Family Medicine

## 2018-09-17 NOTE — Telephone Encounter (Signed)
Patient called very emotional states has been having problems focusing, crying episodes, interrupted sleeping & think her thyroid is out of balance-- Pt given  Acute OV for tomorrow 11/6 to see provider .  --Forwarding message to medical assistant as an Juluis Rainier .  --glh

## 2018-09-18 ENCOUNTER — Ambulatory Visit (INDEPENDENT_AMBULATORY_CARE_PROVIDER_SITE_OTHER): Payer: Medicare Other | Admitting: Family Medicine

## 2018-09-18 ENCOUNTER — Encounter: Payer: Self-pay | Admitting: Family Medicine

## 2018-09-18 VITALS — BP 131/84 | HR 88 | Temp 98.4°F | Wt 184.3 lb

## 2018-09-18 DIAGNOSIS — M7661 Achilles tendinitis, right leg: Secondary | ICD-10-CM

## 2018-09-18 DIAGNOSIS — G4709 Other insomnia: Secondary | ICD-10-CM | POA: Diagnosis not present

## 2018-09-18 DIAGNOSIS — F419 Anxiety disorder, unspecified: Secondary | ICD-10-CM | POA: Diagnosis not present

## 2018-09-18 DIAGNOSIS — Z716 Tobacco abuse counseling: Secondary | ICD-10-CM

## 2018-09-18 DIAGNOSIS — Z720 Tobacco use: Secondary | ICD-10-CM

## 2018-09-18 DIAGNOSIS — M7662 Achilles tendinitis, left leg: Secondary | ICD-10-CM

## 2018-09-18 DIAGNOSIS — E559 Vitamin D deficiency, unspecified: Secondary | ICD-10-CM

## 2018-09-18 DIAGNOSIS — F332 Major depressive disorder, recurrent severe without psychotic features: Secondary | ICD-10-CM

## 2018-09-18 DIAGNOSIS — E039 Hypothyroidism, unspecified: Secondary | ICD-10-CM | POA: Diagnosis not present

## 2018-09-18 DIAGNOSIS — E669 Obesity, unspecified: Secondary | ICD-10-CM

## 2018-09-18 DIAGNOSIS — E781 Pure hyperglyceridemia: Secondary | ICD-10-CM

## 2018-09-18 NOTE — Telephone Encounter (Signed)
Patient was seen in the office today. MPulliam, CMA/RT(R)  

## 2018-09-18 NOTE — Progress Notes (Signed)
Impression and Recommendations:    1. Severe episode of recurrent major depressive disorder, without psychotic features ([place])   2. Anxiety   3. Other insomnia   4. Adult hypothyroidism   5. Tobacco consumption   6. Tobacco abuse counseling   7. Obesity (BMI 30-39.9)   8. Vitamin D deficiency   9. h/o Hypertriglyceridemia   10. Achilles tendinitis of both lower extremities     1. Mood Management - Anxiety, Major Depressive Disorder, Insomnia - Mood not optimally managed at this time. - Need for lab work today.  - Encouraged patient to continue follow up with Dr. Toy Care as prescribed. - Discussed that patient should ask Dr. Toy Care if she needs a medication change. - Treatment plan will continue to be managed by psychiatry.  - Educated patient regarding the spokes of the wheel of mood management, including regular exercise, adequate sleep hygiene, healthful eating, relaxation and meditation, etc.  - Encouraged patient to resume regular meditation.  Seek materials on YouTube etc. - Discussed critical importance of daily exercise when it comes to mood management. - Reviewed prudent dietary habits as another element of mood control.  - Encouraged patient to schedule her days with plenty of self-care included.  2. Adult Hypothyroidism - Continue treatment plan as prescribed. - Lab work drawn today to assess need for changes.  3. Tobacco Abuse Counseling Told pt to think seriously about quitting smoking - try to cut back one cigarette each week. - Told pt it is very important for his/her health and well being.    - Smoking cessation instruction/counseling given:  counseled patient on the dangers of tobacco use, advised patient to stop smoking, and reviewed strategies to maximize success.  4. BMI Counseling - Obesity (BMI 30-39.9) Explained to patient what BMI refers to, and what it means medically.    Told patient to think about it as a "medical risk stratification  measurement" and how increasing BMI is associated with increasing risk/ or worsening state of various diseases such as hypertension, hyperlipidemia, diabetes, premature OA, depression etc.  American Heart Association guidelines for healthy diet, basically Mediterranean diet, and exercise guidelines of 30 minutes 5 days per week or more discussed in detail.  Health counseling performed.  All questions answered.  5. Lifestyle & Preventative Health Maintenance - Advised patient to continue working toward exercising to improve overall mental, physical, and emotional health.    - Encouraged patient to engage in daily physical activity, especially a formal exercise routine.  Recommended that the patient eventually strive for at least 150 minutes of moderate cardiovascular activity per week according to guidelines established by the [facility].   - Healthy dietary habits encouraged, including low-carb, and high amounts of lean protein in diet.   - Patient should also consume adequate amounts of water.   Education and routine counseling performed. Handouts provided.  6. Follow-Up - Lab work drawn as needed. - Continue to follow up with specialists as established. - Return for CPE and chronic follow-up as scheduled.   - Patient knows to call in sooner if desired to address acute concerns.   No orders of the defined types were placed in this encounter.   Medications Discontinued During This Encounter  Medication Reason  . methylPREDNISolone (MEDROL DOSEPAK) 4 MG TBPK tablet Completed Course     No orders of the defined types were placed in this encounter.   Gross side effects, risk and benefits, and alternatives of medications and treatment plan in  general discussed with patient.  Patient is aware that all medications have potential side effects and we are unable to predict every side effect or drug-drug interaction that may occur.   Patient will call with any questions prior to using medication  if they have concerns.  Expresses verbal understanding and consents to current therapy and treatment regimen.  No barriers to understanding were identified.  Red flag symptoms and signs discussed in detail.  Patient expressed understanding regarding what to do in case of emergency\urgent symptoms  Please see AVS handed out to patient at the end of our visit for further patient instructions/ counseling done pertaining to today's office visit.   Return for follow up every 4 mo- healthy habits, tob use, wt mgt, thyroid.     Note:  This document was prepared using Dragon voice recognition software and may include unintentional dictation errors.  This document serves as a record of services personally performed by Mellody Dance, DO. It was created on her behalf by Toni Amend, a trained medical scribe. The creation of this record is based on the scribe's personal observations and the provider's statements to them.   I have reviewed the above medical documentation for accuracy and completeness and I concur.  Mellody Dance 09/18/18 12:17 PM   ----------------------------------------------------------------------------------------------------------------------------------------------    Subjective:    CC:  Chief Complaint  Patient presents with  . crying    waking at night, feels like in a fog    HPI: Ariel King is a 47 y.o. female who presents to [place] at [facility] today for follow-up of mood.   Mood Swings & Sleeplessness Experiencing mood swings and sleeplessness. Is not sure what's going on.  States she's been feeling emotional for a few days.  States she discussed her symptoms with her pharmacist. "Pharmacist thinks it might be something to do with my thyroid."  Patient continues to follow up with Dr. Toy Care, her psychiatrist. She continues taking all of her medications as prescribed.   Checked with pharmacist to ensure that she was taking  everything correctly.  Feels "kind of in a fog, will cry at anything, stressed." Complains of waking up at night, not sleeping through the night. States "feels like I just can't focus," discusses drifting attention.  Patient takes trazodone at night. For the past 4 nights, she has not taken her Niacin (Niaspan).  Patient desires labs.  States "I haven't had my thyroid checked in a while."  Daily Habits She is not exercising.  Notes "I still can't speed walk because of my feet."   Was recently sent to medical massage therapy for her feet.  Otherwise, states she is "up and down," but not eating a lot.   Says "I haven't gone bad on my diet or anything, I'm still drinking my water."  Notes doing her meditation, but not every day. Patient continues smoking, states "some."  "I know I need to quit."  Thyroid Still taking 125 of synthroid every day.    Depression screen [facility] 2/9 09/18/2018 05/21/2018 01/01/2018  Decreased Interest 1 1 2   Down, Depressed, Hopeless 1 1 1   PHQ - 2 Score 2 2 3   Altered sleeping 1 2 2   Tired, decreased energy 1 1 2   Change in appetite 1 0 1  Feeling bad or failure about yourself  1 1 2   Trouble concentrating 1 1 1   Moving slowly or fidgety/restless 1 0 2  Suicidal thoughts 0 0 0  PHQ-9 Score 8  7 13  Difficult doing work/chores Somewhat difficult Somewhat difficult -     GAD 7 : Generalized Anxiety Score 03/22/2017  Nervous, Anxious, on Edge 3  Control/stop worrying 3  Worry too much - different things 3  Trouble relaxing 3  Restless 3  Easily annoyed or irritable 3  Afraid - awful might happen 1  Total GAD 7 Score 19  Anxiety Difficulty Very difficult     Wt Readings from Last 3 Encounters:  09/18/18 184 lb 4.8 oz (83.6 kg)  05/21/18 182 lb 8 oz (82.8 kg)  01/01/18 200 lb (90.7 kg)   BP Readings from Last 3 Encounters:  09/18/18 131/84  05/21/18 110/75  01/01/18 99/64   Pulse Readings from Last 3 Encounters:  09/18/18 88  05/21/18 96    01/01/18 (!) 103   BMI Readings from Last 3 Encounters:  09/18/18 31.64 kg/m  05/21/18 31.33 kg/m  01/01/18 34.60 kg/m         Patient Care Team    Relationship Specialty Notifications Start End  Mellody Dance, DO PCP - General Family Medicine  05/08/16   Chucky May, MD Consulting Physician Psychiatry  08/29/16   Garrel Ridgel, DPM Consulting Physician Podiatry  01/01/18   [facility] [place] Physician Dermatology  01/01/18    Comment: does yrly skin screening.      Dr.  Silver Huguenin, Hoyle Sauer, DO Consulting Physician Rehabilitation  01/01/18    Comment: spine and back pain  Delsa Bern, MD Consulting Physician Obstetrics and Gynecology  01/02/18      Patient Active Problem List   Diagnosis Date Noted  . Severe episode of recurrent major depressive disorder, without psychotic features ([place]) 03/05/2017    Priority: High  . Panic attacks 06/09/2016    Priority: High  . Adult hypothyroidism 12/19/2015    Priority: High  . Other insomnia 12/25/2012    Priority: High  . Anxiety 03/14/2012    Priority: High  . Depression 03/14/2012    Priority: High  . Obesity (BMI 30-39.9) 11/03/2013    Priority: Medium  . h/o Hypertriglyceridemia 12/25/2012    Priority: Medium  . Reactive airway disease with wheezing 05/21/2018    Priority: Low  . Tobacco consumption 05/21/2018    Priority: Low  . Tobacco abuse counseling 05/21/2018    Priority: Low  . Vitamin D deficiency 06/09/2016    Priority: Low  . Achilles tendinitis of both lower extremities 05/21/2018  . Chronic right-sided low back pain with right-sided sciatica 05/22/2017  . History of chlamydia 03/22/2017  . Muscle strain 03/22/2017  . Lumbar spine strain, initial encounter 03/22/2017  . Encounter for wellness examination 10/25/2016  . Status post hysterectomy, left ovaries, took cervix. 06/09/2016  . Chronic pain syndrome 12/25/2012  . h/o Blood glucose elevated 12/25/2012  . Chronic  fatigue, unspecified 12/25/2012  . Migraine without aura and without status migrainosus, not intractable 12/25/2012  . Migraines 03/14/2012  . Chronic fatigue disorder 03/14/2012    Past Medical history, Surgical history, Family history, Social history, Allergies and Medications have been entered into the medical record, reviewed and changed as needed.    Current Meds  Medication Sig  . albuterol (PROVENTIL HFA;VENTOLIN HFA) 108 (90 Base) MCG/ACT inhaler Inhale 1-2 puffs into the lungs every 4 (four) hours as needed for wheezing or shortness of breath.  . ALPRAZolam (XANAX) 0.5 MG tablet Take 1-2 tablets by mouth every 6 (six) hours as needed.  Marland Kitchen amphetamine-dextroamphetamine (ADDERALL) 20 MG tablet Take  1 tablet by mouth 3 (three) times daily.  . B Complex Vitamins (VITAMIN-B COMPLEX) TABS Take 1 tablet by mouth daily.  . celecoxib (CELEBREX) 200 MG capsule Take 1 capsule (200 mg total) by mouth 2 (two) times daily.  . diclofenac (VOLTAREN) 75 MG EC tablet TAKE 1 TABLET(75 MG) BY MOUTH TWICE DAILY  . diclofenac sodium (VOLTAREN) 1 % GEL Apply 4 g topically 4 (four) times daily.  . fluticasone (FLONASE) 50 MCG/ACT nasal spray SPRAY 2 SPRAYS INTO EACH NOSTRILS EVERYDAY  . gabapentin (NEURONTIN) 300 MG capsule TAKE 1 CAPSULE BY MOUTH THREE TIMES A DAY  . ibuprofen (ADVIL,MOTRIN) 600 MG tablet Take 1 tablet (600 mg total) by mouth every 8 (eight) hours as needed for moderate pain (short term use only).  Marland Kitchen levothyroxine (SYNTHROID, LEVOTHROID) 125 MCG tablet Take 1 tablet (125 mcg total) by mouth daily.  . methocarbamol (ROBAXIN) 500 MG tablet TAKE 1 TABLET (500 MG TOTAL) BY MOUTH NIGHTLY AS NEEDED  . montelukast (SINGULAIR) 10 MG tablet Take 1 tablet (10 mg total) by mouth at bedtime.  . niacin (NIASPAN) 1000 MG CR tablet Take 1 tablet (1,000 mg total) by mouth at bedtime.  Marland Kitchen QUEtiapine (SEROQUEL) 100 MG tablet Take 100 mg by mouth as needed.  . traZODone (DESYREL) 100 MG tablet Take 3 tablets  by mouth at bedtime.   Marland Kitchen venlafaxine XR (EFFEXOR-XR) 75 MG 24 hr capsule Take 3 capsules by mouth every morning.  . Vitamin D, Ergocalciferol, (DRISDOL) 50000 units CAPS capsule Take 1 capsule (50,000 Units total) by mouth every 7 (seven) days.   Current Facility-Administered Medications for the 09/18/18 encounter (Office Visit) with Mellody Dance, DO  Medication  . triamcinolone acetonide (KENALOG) 10 MG/ML injection 10 mg  . triamcinolone acetonide (KENALOG) 10 MG/ML injection 10 mg    Allergies:  Allergies  Allergen Reactions  . Naltrexone Other (See Comments)    Panic attack     Review of Systems: Review of Systems: General:   No F/C, wt loss Pulm:   No DIB, SOB, pleuritic chest pain Card:  No CP, palpitations Abd:  No n/v/d or pain Ext:  No inc edema from baseline Psych: no SI/ HI    Objective:   Blood pressure 131/84, pulse 88, temperature 98.4 F (36.9 C), weight 184 lb 4.8 oz (83.6 kg), SpO2 98 %. Body mass index is 31.64 kg/m. General:  Well Developed, well nourished, appropriate for stated age.  Neuro:  Alert and oriented,  extra-ocular muscles intact  HEENT:  Normocephalic, atraumatic, neck supple, no carotid bruits appreciated  Skin:  no gross rash, warm, pink. Cardiac:  RRR, S1 S2 Respiratory:  ECTA B/L and A/P, Not using accessory muscles, speaking in full sentences- unlabored. Vascular:  Ext warm, no cyanosis apprec.; cap RF less 2 sec. Psych:  No HI/SI, judgement and insight good, Euthymic mood. Full Affect.

## 2018-09-18 NOTE — Patient Instructions (Signed)
As we discussed try to cut back one cigarette each week.   Please get back to the gym and do all other parts of your body besides your feet\ ankles and lower extremities.   -Follow-up with Dr. Toy Care regarding medication changes if need be   I want you to do [employer] Hook-up and square breathing 4-5 times per day.  10 square breathing each time.   -Also we need to transition your brain into thinking more positively.  These tasks below are some things I want you to do every day 1)  write 3 new things that you are grateful for every day for 21 days  2)  exercise daily- walk for 15 minutes twice a day every day 3)  you are going to journal every day about one positive experience that you had 4)  meditate every day.  You can go on YouTube and look for 15-minute relaxation meditation or what ever.  But we need to make sure that you are in the moment and relaxing and deep breathing every day 5)  Write 1 positive email every day to praise someone in your life    - If you have insomnia or difficulty sleeping, this information is for you: - Avoid caffeinated beverages after lunch,  no alcoholic beverages,  no eating within 2-3 hours of lying down,  avoid exposure to blue light before bed,  avoid daytime naps, and  needs to maintain a regular sleep schedule- go to sleep and wake up around the same time every night.   - Resolve concerns or worries before entering bedroom:  Discussed relaxation techniques with patient and to keep a journal to write down fears\ worries.  I suggested seeing a counselor for CBT.   - Recommend patient meditate or do deep breathing exercises to help relax.   Incorporate the use of white noise machines or listen to "sleep meditation music", or recordings of guided meditations for sleep from YouTube which are free, such as  "guided meditation for detachment from over thinking"  by Mayford Knife.

## 2018-09-19 LAB — CBC WITH DIFFERENTIAL/PLATELET
Basophils Absolute: 0 10*3/uL (ref 0.0–0.2)
Basos: 0 %
EOS (ABSOLUTE): 0.1 10*3/uL (ref 0.0–0.4)
Eos: 1 %
Hematocrit: 41.2 % (ref 34.0–46.6)
Hemoglobin: 14.5 g/dL (ref 11.1–15.9)
Immature Grans (Abs): 0 10*3/uL (ref 0.0–0.1)
Immature Granulocytes: 0 %
Lymphocytes Absolute: 2.1 10*3/uL (ref 0.7–3.1)
Lymphs: 21 %
MCH: 32.6 pg (ref 26.6–33.0)
MCHC: 35.2 g/dL (ref 31.5–35.7)
MCV: 93 fL (ref 79–97)
Monocytes Absolute: 0.9 10*3/uL (ref 0.1–0.9)
Monocytes: 9 %
Neutrophils Absolute: 6.9 10*3/uL (ref 1.4–7.0)
Neutrophils: 69 %
Platelets: 240 10*3/uL (ref 150–450)
RBC: 4.45 x10E6/uL (ref 3.77–5.28)
RDW: 12.6 % (ref 12.3–15.4)
WBC: 10 10*3/uL (ref 3.4–10.8)

## 2018-09-19 LAB — COMPREHENSIVE METABOLIC PANEL
ALT: 18 IU/L (ref 0–32)
AST: 20 IU/L (ref 0–40)
Albumin/Globulin Ratio: 1.8 (ref 1.2–2.2)
Albumin: 4.4 g/dL (ref 3.5–5.5)
Alkaline Phosphatase: 49 IU/L (ref 39–117)
BUN/Creatinine Ratio: 16 (ref 9–23)
BUN: 11 mg/dL (ref 6–24)
Bilirubin Total: 0.2 mg/dL (ref 0.0–1.2)
CO2: 22 mmol/L (ref 20–29)
Calcium: 9.2 mg/dL (ref 8.7–10.2)
Chloride: 104 mmol/L (ref 96–106)
Creatinine, Ser: 0.67 mg/dL (ref 0.57–1.00)
GFR calc Af Amer: 120 mL/min/{1.73_m2} (ref >59)
GFR calc non Af Amer: 104 mL/min/{1.73_m2} (ref >59)
Globulin, Total: 2.4 g/dL (ref 1.5–4.5)
Glucose: 99 mg/dL (ref 65–99)
Potassium: 4.4 mmol/L (ref 3.5–5.2)
Sodium: 140 mmol/L (ref 134–144)
Total Protein: 6.8 g/dL (ref 6.0–8.5)

## 2018-09-19 LAB — LIPID PANEL
Chol/HDL Ratio: 2.6 ratio (ref 0.0–4.4)
Cholesterol, Total: 159 mg/dL (ref 100–199)
HDL: 62 mg/dL (ref >39)
LDL Calculated: 74 mg/dL (ref 0–99)
Triglycerides: 117 mg/dL (ref 0–149)
VLDL Cholesterol Cal: 23 mg/dL (ref 5–40)

## 2018-09-19 LAB — TSH: TSH: 0.44 u[IU]/mL — ABNORMAL LOW (ref 0.450–4.500)

## 2018-09-19 LAB — HEMOGLOBIN A1C
Est. average glucose Bld gHb Est-mCnc: 100 mg/dL
Hgb A1c MFr Bld: 5.1 % (ref 4.8–5.6)

## 2018-09-19 LAB — MAGNESIUM: Magnesium: 2 mg/dL (ref 1.6–2.3)

## 2018-09-19 LAB — VITAMIN D 25 HYDROXY (VIT D DEFICIENCY, FRACTURES): Vit D, 25-Hydroxy: 99.6 ng/mL (ref 30.0–100.0)

## 2018-09-19 LAB — T4, FREE: Free T4: 1.56 ng/dL (ref 0.82–1.77)

## 2018-10-03 ENCOUNTER — Ambulatory Visit: Payer: BC Managed Care – PPO

## 2018-10-03 ENCOUNTER — Ambulatory Visit (INDEPENDENT_AMBULATORY_CARE_PROVIDER_SITE_OTHER): Payer: Medicare Other

## 2018-10-03 ENCOUNTER — Ambulatory Visit (INDEPENDENT_AMBULATORY_CARE_PROVIDER_SITE_OTHER): Payer: Medicare Other | Admitting: Sports Medicine

## 2018-10-03 ENCOUNTER — Encounter: Payer: Self-pay | Admitting: Sports Medicine

## 2018-10-03 DIAGNOSIS — M722 Plantar fascial fibromatosis: Secondary | ICD-10-CM

## 2018-10-03 DIAGNOSIS — M7661 Achilles tendinitis, right leg: Secondary | ICD-10-CM

## 2018-10-03 DIAGNOSIS — S99922A Unspecified injury of left foot, initial encounter: Secondary | ICD-10-CM

## 2018-10-03 DIAGNOSIS — M79672 Pain in left foot: Secondary | ICD-10-CM

## 2018-10-03 DIAGNOSIS — R2681 Unsteadiness on feet: Secondary | ICD-10-CM

## 2018-10-03 MED ORDER — TRIAMCINOLONE ACETONIDE 40 MG/ML IJ SUSP
20.0000 mg | Freq: Once | INTRAMUSCULAR | Status: AC
  Administered 2018-10-03: 20 mg

## 2018-10-03 MED ORDER — CELECOXIB 200 MG PO CAPS: 200.0000 mg | ORAL_CAPSULE | Freq: Two times a day (BID) | ORAL | 1 refills | Status: DC

## 2018-10-03 NOTE — Progress Notes (Signed)
Subjective: Ariel King is a 47 y.o. female patient who presents to office for evaluation of Right and left heel pain. Patient reports that pain is slowly getting better they are not hurting as bad as they were pain today 6 out of 10 states that she still has some tightness and pain at the Achilles and has some pain in her heel at the bottom also reports that this morning she bumped her left fifth toe against the chair and it is tender and painful and want to have this toe check.  Patient has not tried any treatment for this toe but overall she has been compliant with doing her stretching exercises wearing good supportive shoes going to massage therapy taking supplements and taking Celebrex for which she denies any problems or complications with.  Patient denies any other pedal complaints.   Patient Active Problem List   Diagnosis Date Noted  . Reactive airway disease with wheezing 05/21/2018  . Tobacco consumption 05/21/2018  . Tobacco abuse counseling 05/21/2018  . Achilles tendinitis of both lower extremities 05/21/2018  . Chronic right-sided low back pain with right-sided sciatica 05/22/2017  . History of chlamydia 03/22/2017  . Muscle strain 03/22/2017  . Lumbar spine strain, initial encounter 03/22/2017  . Severe episode of recurrent major depressive disorder, without psychotic features ([place]) 03/05/2017  . Encounter for wellness examination 10/25/2016  . Panic attacks 06/09/2016  . Status post hysterectomy, left ovaries, took cervix. 06/09/2016  . Vitamin D deficiency 06/09/2016  . Adult hypothyroidism 12/19/2015  . Obesity (BMI 30-39.9) 11/03/2013  . Chronic pain syndrome 12/25/2012  . h/o Blood glucose elevated 12/25/2012  . h/o Hypertriglyceridemia 12/25/2012  . Other insomnia 12/25/2012  . Chronic fatigue, unspecified 12/25/2012  . Migraine without aura and without status migrainosus, not intractable 12/25/2012  . Anxiety 03/14/2012  . Migraines 03/14/2012  . Depression  03/14/2012  . Chronic fatigue disorder 03/14/2012    Current Outpatient Medications on File Prior to Visit  Medication Sig Dispense Refill  . albuterol (PROVENTIL HFA;VENTOLIN HFA) 108 (90 Base) MCG/ACT inhaler Inhale 1-2 puffs into the lungs every 4 (four) hours as needed for wheezing or shortness of breath. 1 Inhaler 2  . ALPRAZolam (XANAX) 0.5 MG tablet Take 1-2 tablets by mouth every 6 (six) hours as needed.    Marland Kitchen amphetamine-dextroamphetamine (ADDERALL) 20 MG tablet Take 1 tablet by mouth 3 (three) times daily.    . B Complex Vitamins (VITAMIN-B COMPLEX) TABS Take 1 tablet by mouth daily.    . diclofenac sodium (VOLTAREN) 1 % GEL Apply 4 g topically 4 (four) times daily. 100 g 2  . fluticasone (FLONASE) 50 MCG/ACT nasal spray SPRAY 2 SPRAYS INTO EACH NOSTRILS EVERYDAY 16 g 2  . gabapentin (NEURONTIN) 300 MG capsule TAKE 1 CAPSULE BY MOUTH THREE TIMES A DAY    . ibuprofen (ADVIL,MOTRIN) 600 MG tablet Take 1 tablet (600 mg total) by mouth every 8 (eight) hours as needed for moderate pain (short term use only). 30 tablet 0  . levothyroxine (SYNTHROID, LEVOTHROID) 125 MCG tablet Take 1 tablet (125 mcg total) by mouth daily. 90 tablet 3  . methocarbamol (ROBAXIN) 500 MG tablet TAKE 1 TABLET (500 MG TOTAL) BY MOUTH NIGHTLY AS NEEDED  3  . montelukast (SINGULAIR) 10 MG tablet Take 1 tablet (10 mg total) by mouth at bedtime. 90 tablet 3  . niacin (NIASPAN) 1000 MG CR tablet Take 1 tablet (1,000 mg total) by mouth at bedtime. 90 tablet 3  . QUEtiapine (SEROQUEL) 100  MG tablet Take 100 mg by mouth as needed.    . traZODone (DESYREL) 100 MG tablet Take 3 tablets by mouth at bedtime.     Marland Kitchen venlafaxine XR (EFFEXOR-XR) 75 MG 24 hr capsule Take 3 capsules by mouth every morning.    . Vitamin D, Ergocalciferol, (DRISDOL) 50000 units CAPS capsule Take 1 capsule (50,000 Units total) by mouth every 7 (seven) days. 12 capsule 1   Current Facility-Administered Medications on File Prior to Visit  Medication  Dose Route Frequency Provider Last Rate Last Dose  . triamcinolone acetonide (KENALOG) 10 MG/ML injection 10 mg  10 mg Other Once Landis Martins, DPM      . triamcinolone acetonide (KENALOG) 10 MG/ML injection 10 mg  10 mg Other Once Landis Martins, DPM        Allergies  Allergen Reactions  . Naltrexone Other (See Comments)    Panic attack    Objective:  General: Alert and oriented x3 in no acute distress.  Patient has her 72-month old granddaughter in the room with her this visit.  Dermatology: No open lesions bilateral lower extremities, no webspace macerations, no ecchymosis bilateral, all nails x 10 are well manicured.  Vascular: Dorsalis Pedis and Posterior Tibial pedal pulses 2/4, Capillary Fill Time 3 seconds, + pedal hair growth bilateral, no edema bilateral lower extremities, Temperature gradient within normal limits.  Neurology: Johney Maine sensation intact via light touch bilateral.  Musculoskeletal: No reproducible symptoms to left fifth toe only hurts when she bumped it no residual pain or deformity noted to the fifth toe.  Mild tenderness with palpation at insertion of the plantar fascia right and left heel and mild tenderness with palpation at the Achilles on the right and left, there is calcaneal exostosis with mild soft tissue present and decreased ankle rom with knee extending  vs flexed resembling gastroc equnius bilateral, The achilles tendon feels intact with no nodularity or palpable dell, Thompson sign negative, Subtalar and midtarsal joint range of motion is within normal limits, there is no 1st ray hypermobility or forefoot deformity noted bilateral.  X-rays left and right foot no acute fracture or dislocation unchanged posterior calcaneal heel spurs bilateral no other acute findings.  Assessment and Plan: Problem List Items Addressed This Visit    None    Visit Diagnoses    Left foot pain    -  Primary   Relevant Orders   DG Foot Complete Left   DME Other see  comment   Achilles tendinitis, right leg       Relevant Orders   DG Foot Complete Right   DME Other see comment   Plantar fasciitis, bilateral       Relevant Medications   triamcinolone acetonide (KENALOG-40) injection 20 mg (Completed)   Other Relevant Orders   DME Other see comment   Gait instability       Relevant Orders   DME Other see comment   Injury of toe on left foot, initial encounter          -Complete examination performed -Extremities reviewed -Discussed continued care for tendinitis and flare of fasciitis and stub injury to toe -After oral consent and aseptic prep, injected a mixture containing 1 ml of 2%  plain lidocaine, 1 ml 0.5% plain marcaine, 0.5 ml of kenalog 40 and 0.5 ml of dexamethasone phosphate at the glabrous junction at the plantar fascia is bilateral heels without incident. Post-injection care discussed with patient. -Continue with heel lifts -Continue with Celebrex -Patient to continue  with therapeutic massage and PT -Advised good supportive shoes -Continue with supplements of tumeric, glucosamine, collagen powder, and vitamin E as tolerated -Advised patient to be careful and to refrain from bumping her toes and that no additional treatment is needed for her left fifth toe at this time -Reordered cane for patient to use as needed for stability in gait -Patient to return to office as needed and advised patient to consider EPAT versus surgery if pain recurs.  Landis Martins, DPM

## 2018-10-03 NOTE — Patient Instructions (Signed)
For tennis shoes recommend:  Kandy Garrison Ascis New balance Saucony Can be purchased at [employer] or [employer]  Vionic  SAS Can be purchased at [employer] or [employer]   For work shoes recommend: [employer]  Can be purchased at a variety of places or [employer], maintenance (IT)   For casual shoes recommend: Vionic  Can be purchased at [employer] or [employer]  Plantar Fasciitis Plantar fasciitis is a painful foot condition that affects the heel. It occurs when the band of tissue that connects the toes to the heel bone (plantar fascia) becomes irritated. This can happen after exercising too much or doing other repetitive activities (overuse injury). The pain from plantar fasciitis can range from mild irritation to severe pain that makes it difficult for you to walk or move. The pain is usually worse in the morning or after you have been sitting or lying down for a while. What are the causes? This condition may be caused by:  Standing for long periods of time.  Wearing shoes that do not fit.  Doing high-impact activities, including running, aerobics, and ballet.  Being overweight.  Having an abnormal way of walking (gait).  Having tight calf muscles.  Having high arches in your feet.  Starting a new athletic activity.  What are the signs or symptoms? The main symptom of this condition is heel pain. Other symptoms include:  Pain that gets worse after activity or exercise.  Pain that is worse in the morning or after resting.  Pain that goes away after you walk for a few minutes.  How is this diagnosed? This condition may be diagnosed based on your signs and symptoms. Your health care provider will also do a physical exam to check for:  A tender area on the bottom of your foot.  A high arch in your foot.  Pain when you move your foot.  Difficulty moving your foot.  You may also need to have imaging studies to confirm the diagnosis. These can  include:  X-rays.  Ultrasound.  MRI.  How is this treated? Treatment for plantar fasciitis depends on the severity of the condition. Your treatment may include:  Rest, ice, and over-the-counter pain medicines to manage your pain.  Exercises to stretch your calves and your plantar fascia.  A splint that holds your foot in a stretched, upward position while you sleep (night splint).  Physical therapy to relieve symptoms and prevent problems in the future.  Cortisone injections to relieve severe pain.  Extracorporeal shock wave therapy (ESWT) to stimulate damaged plantar fascia with electrical impulses. It is often used as a last resort before surgery.  Surgery, if other treatments have not worked after 12 months.  Follow these instructions at home:  Take medicines only as directed by your health care provider.  Avoid activities that cause pain.  Roll the bottom of your foot over a bag of ice or a bottle of cold water. Do this for 20 minutes, 3-4 times a day.  Perform simple stretches as directed by your health care provider.  Try wearing athletic shoes with air-sole or gel-sole cushions or soft shoe inserts.  Wear a night splint while sleeping, if directed by your health care provider.  Keep all follow-up appointments with your health care provider. How is this prevented?  Do not perform exercises or activities that cause heel pain.  Consider finding low-impact activities if you continue to have problems.  Lose weight if you need to. The best way to prevent  plantar fasciitis is to avoid the activities that aggravate your plantar fascia. Contact a health care provider if:  Your symptoms do not go away after treatment with home care measures.  Your pain gets worse.  Your pain affects your ability to move or do your daily activities. This information is not intended to replace advice given to you by your health care provider. Make sure you discuss any questions you  have with your health care provider. Document Released: 07/25/2001 Document Revised: 04/03/2016 Document Reviewed: 09/09/2014 Elsevier Interactive Patient Education  2018 [place].  Achilles Tendinitis Achilles tendinitis is inflammation of the tough, cord-like band that attaches the lower leg muscles to the heel bone (Achilles tendon). This is usually caused by overusing the tendon and the ankle joint. Achilles tendinitis usually gets better over time with treatment and caring for yourself at home. It can take weeks or months to heal completely. What are the causes? This condition may be caused by:  A sudden increase in exercise or activity, such as running.  Doing the same exercises or activities (such as jumping) over and over.  Not warming up calf muscles before exercising.  Exercising in shoes that are worn out or not made for exercise.  Having arthritis or a bone growth (spur) on the back of the heel bone. This can rub against the tendon and hurt it.  Age-related wear and tear. Tendons become less flexible with age and more likely to be injured.  What are the signs or symptoms? Common symptoms of this condition include:  Pain in the Achilles tendon or in the back of the leg, just above the heel. The pain usually gets worse with exercise.  Stiffness or soreness in the back of the leg, especially in the morning.  Swelling of the skin over the Achilles tendon.  Thickening of the tendon.  Bone spurs at the bottom of the Achilles tendon, near the heel.  Trouble standing on tiptoe.  How is this diagnosed? This condition is diagnosed based on your symptoms and a physical exam. You may have tests, including:  X-rays.  MRI.  How is this treated? The goal of treatment is to relieve symptoms and help your injury heal. Treatment may include:  Decreasing or stopping activities that caused the tendinitis. This may mean switching to low-impact exercises like biking or  swimming.  Icing the injured area.  Doing physical therapy, including strengthening and stretching exercises.  NSAIDs to help relieve pain and swelling.  Using supportive shoes, wraps, heel lifts, or a walking boot (air cast).  Surgery. This may be done if your symptoms do not improve after 6 months.  Using high-energy shock wave impulses to stimulate the healing process (extracorporeal shock wave therapy). This is rare.  Injection of medicines to help relieve inflammation (corticosteroids). This is rare.  Follow these instructions at home: If you have an air cast:  Wear the cast as told by your health care provider. Remove it only as told by your health care provider.  Loosen the cast if your toes tingle, become numb, or turn cold and blue. Activity  Gradually return to your normal activities once your health care provider approves. Do not do activities that cause pain. ? Consider doing low-impact exercises, like cycling or swimming.  If you have an air cast, ask your health care provider when it is safe for you to drive.  If physical therapy was prescribed, do exercises as told by your health care provider or physical therapist. Managing  pain, stiffness, and swelling  Raise (elevate) your foot above the level of your heart while you are sitting or lying down.  Move your toes often to avoid stiffness and to lessen swelling.  If directed, put ice on the injured area: ? Put ice in a plastic bag. ? Place a towel between your skin and the bag. ? Leave the ice on for 20 minutes, 2-3 times a day General instructions  If directed, wrap your foot with an elastic bandage or other wrap. This can help keep your tendon from moving too much while it heals. Your health care provider will show you how to wrap your foot correctly.  Wear supportive shoes or heel lifts only as told by your health care provider.  Take over-the-counter and prescription medicines only as told by your health  care provider.  Keep all follow-up visits as told by your health care provider. This is important. Contact a health care provider if:  You have symptoms that gets worse.  You have pain that does not get better with medicine.  You develop new, unexplained symptoms.  You develop warmth and swelling in your foot.  You have a fever. Get help right away if:  You have a sudden popping sound or sensation in your Achilles tendon followed by severe pain.  You cannot move your toes or foot.  You cannot put any weight on your foot. Summary  Achilles tendinitis is inflammation of the tough, cord-like band that attaches the lower leg muscles to the heel bone (Achilles tendon).  This condition is usually caused by overusing the tendon and the ankle joint. It can also be caused by arthritis or normal aging.  The most common symptoms of this condition include pain, swelling, or stiffness in the Achilles tendon or in the back of the leg.  This condition is usually treated with rest, NSAIDs, and physical therapy. This information is not intended to replace advice given to you by your health care provider. Make sure you discuss any questions you have with your health care provider. Document Released: 08/09/2005 Document Revised: 09/18/2016 Document Reviewed: 09/18/2016 Elsevier Interactive Patient Education  2017 [employer].

## 2018-10-04 ENCOUNTER — Other Ambulatory Visit: Payer: Self-pay | Admitting: Sports Medicine

## 2018-10-04 DIAGNOSIS — M722 Plantar fascial fibromatosis: Secondary | ICD-10-CM

## 2018-10-04 DIAGNOSIS — M79672 Pain in left foot: Secondary | ICD-10-CM

## 2018-10-04 DIAGNOSIS — M7661 Achilles tendinitis, right leg: Secondary | ICD-10-CM

## 2018-10-30 ENCOUNTER — Other Ambulatory Visit: Payer: Self-pay | Admitting: Sports Medicine

## 2018-10-31 ENCOUNTER — Ambulatory Visit: Payer: BC Managed Care – PPO | Admitting: Sports Medicine

## 2018-11-12 ENCOUNTER — Telehealth: Payer: Self-pay | Admitting: Family Medicine

## 2018-11-12 NOTE — Telephone Encounter (Signed)
Patient called request lab results--- forwarding message to medical assistant to contact pt.  --glh

## 2018-11-14 NOTE — Telephone Encounter (Signed)
Patient was calling for results from 09/18/2018.  MyChart message was sent by Dr. Raliegh Scarlet.  Notified the patient of results based on note in chart form Dr. Raliegh Scarlet. MPulliam, CMA/RT(R)

## 2018-11-15 ENCOUNTER — Ambulatory Visit: Payer: BC Managed Care – PPO | Admitting: Sports Medicine

## 2018-11-25 ENCOUNTER — Other Ambulatory Visit: Payer: Self-pay

## 2018-11-25 DIAGNOSIS — E559 Vitamin D deficiency, unspecified: Secondary | ICD-10-CM

## 2018-11-25 DIAGNOSIS — E669 Obesity, unspecified: Secondary | ICD-10-CM

## 2018-11-25 DIAGNOSIS — E781 Pure hyperglyceridemia: Secondary | ICD-10-CM

## 2018-11-25 DIAGNOSIS — E039 Hypothyroidism, unspecified: Secondary | ICD-10-CM

## 2018-11-25 MED ORDER — MONTELUKAST SODIUM 10 MG PO TABS: 10.0000 mg | ORAL_TABLET | Freq: Every day | ORAL | 1 refills | Status: DC

## 2018-11-25 MED ORDER — VITAMIN D (ERGOCALCIFEROL) 1.25 MG (50000 UNIT) PO CAPS: 50000.0000 [IU] | ORAL_CAPSULE | ORAL | 3 refills | Status: DC

## 2018-11-25 MED ORDER — LEVOTHYROXINE SODIUM 125 MCG PO TABS: 125.0000 ug | ORAL_TABLET | Freq: Every day | ORAL | 1 refills | Status: DC

## 2018-11-25 NOTE — Telephone Encounter (Signed)
Patient is switching to [place] and need new rxs sent in.  Reviewed chart and sent in per office policy. MPulliam, CMA/RT(R)

## 2018-11-27 ENCOUNTER — Ambulatory Visit: Payer: BC Managed Care – PPO | Admitting: Sports Medicine

## 2018-12-02 ENCOUNTER — Other Ambulatory Visit: Payer: Self-pay

## 2018-12-02 ENCOUNTER — Other Ambulatory Visit: Payer: Self-pay | Admitting: Sports Medicine

## 2018-12-02 MED ORDER — FLUTICASONE PROPIONATE 50 MCG/ACT NA SUSP: NASAL | 2 refills | Status: DC

## 2018-12-02 NOTE — Telephone Encounter (Signed)
Pharmacy sent refill request for Flonase, chart reviewed and refill sent to pharmacy. MPulliam, CMA/RT(R)

## 2018-12-05 ENCOUNTER — Other Ambulatory Visit: Payer: Self-pay

## 2018-12-05 MED ORDER — FLUTICASONE PROPIONATE 50 MCG/ACT NA SUSP: NASAL | 2 refills | Status: DC

## 2018-12-12 ENCOUNTER — Ambulatory Visit: Payer: Medicare Other | Admitting: Sports Medicine

## 2018-12-19 ENCOUNTER — Ambulatory Visit (INDEPENDENT_AMBULATORY_CARE_PROVIDER_SITE_OTHER): Payer: Medicare HMO | Admitting: Sports Medicine

## 2018-12-19 ENCOUNTER — Encounter: Payer: Self-pay | Admitting: Sports Medicine

## 2018-12-19 DIAGNOSIS — M722 Plantar fascial fibromatosis: Secondary | ICD-10-CM | POA: Diagnosis not present

## 2018-12-19 DIAGNOSIS — M7662 Achilles tendinitis, left leg: Secondary | ICD-10-CM

## 2018-12-19 DIAGNOSIS — M7661 Achilles tendinitis, right leg: Secondary | ICD-10-CM | POA: Diagnosis not present

## 2018-12-19 MED ORDER — PREDNISONE 10 MG (21) PO TBPK: ORAL_TABLET | ORAL | 0 refills | Status: DC

## 2018-12-19 NOTE — Progress Notes (Signed)
Subjective: Ariel King is a 48 y.o. female patient who presents to office for evaluation of Right and left heel pain. Patient reports that pain is a little better after the shot given last visit. Pain today 6 out of 10 states that she still has some tightness and pain at the Achilles worse on the right than the left and also some pain at her plantar fascia.  Patient reports that she has been trying to increase her activity and doing a lot more walking and working out at the gym.  Patient reports that changing shoes has helped some and that she has been elevating to help keep the swelling down at her ankles taking Celebrex or ibuprofen as needed and using over-the-counter insoles.  Patient denies any other pedal complaints.   Patient Active Problem List   Diagnosis Date Noted  . Reactive airway disease with wheezing 05/21/2018  . Tobacco consumption 05/21/2018  . Tobacco abuse counseling 05/21/2018  . Achilles tendinitis of both lower extremities 05/21/2018  . Chronic right-sided low back pain with right-sided sciatica 05/22/2017  . History of chlamydia 03/22/2017  . Muscle strain 03/22/2017  . Lumbar spine strain, initial encounter 03/22/2017  . Severe episode of recurrent major depressive disorder, without psychotic features ([place]) 03/05/2017  . Encounter for wellness examination 10/25/2016  . Panic attacks 06/09/2016  . Status post hysterectomy, left ovaries, took cervix. 06/09/2016  . Vitamin D deficiency 06/09/2016  . Adult hypothyroidism 12/19/2015  . Obesity (BMI 30-39.9) 11/03/2013  . Chronic pain syndrome 12/25/2012  . h/o Blood glucose elevated 12/25/2012  . h/o Hypertriglyceridemia 12/25/2012  . Other insomnia 12/25/2012  . Chronic fatigue, unspecified 12/25/2012  . Migraine without aura and without status migrainosus, not intractable 12/25/2012  . Anxiety 03/14/2012  . Migraines 03/14/2012  . Depression 03/14/2012  . Chronic fatigue disorder 03/14/2012    Current  Outpatient Medications on File Prior to Visit  Medication Sig Dispense Refill  . albuterol (PROVENTIL HFA;VENTOLIN HFA) 108 (90 Base) MCG/ACT inhaler Inhale 1-2 puffs into the lungs every 4 (four) hours as needed for wheezing or shortness of breath. 1 Inhaler 2  . ALPRAZolam (XANAX) 0.5 MG tablet Take 1-2 tablets by mouth every 6 (six) hours as needed.    Marland Kitchen amphetamine-dextroamphetamine (ADDERALL) 20 MG tablet Take 1 tablet by mouth 3 (three) times daily.    . B Complex Vitamins (VITAMIN-B COMPLEX) TABS Take 1 tablet by mouth daily.    . celecoxib (CELEBREX) 200 MG capsule TAKE 1 CAPSULE(200 MG) BY MOUTH TWICE DAILY 60 capsule 0  . diclofenac sodium (VOLTAREN) 1 % GEL Apply 4 g topically 4 (four) times daily. 100 g 2  . fluticasone (FLONASE) 50 MCG/ACT nasal spray SPRAY 2 SPRAYS INTO EACH NOSTRILS EVERYDAY 16 g 2  . gabapentin (NEURONTIN) 300 MG capsule TAKE 1 CAPSULE BY MOUTH THREE TIMES A DAY    . ibuprofen (ADVIL,MOTRIN) 600 MG tablet Take 1 tablet (600 mg total) by mouth every 8 (eight) hours as needed for moderate pain (short term use only). 30 tablet 0  . levothyroxine (SYNTHROID, LEVOTHROID) 125 MCG tablet Take 1 tablet (125 mcg total) by mouth daily. 90 tablet 1  . methocarbamol (ROBAXIN) 500 MG tablet TAKE 1 TABLET (500 MG TOTAL) BY MOUTH NIGHTLY AS NEEDED  3  . montelukast (SINGULAIR) 10 MG tablet Take 1 tablet (10 mg total) by mouth at bedtime. 90 tablet 1  . niacin (NIASPAN) 1000 MG CR tablet Take 1 tablet (1,000 mg total) by mouth at bedtime.  90 tablet 3  . QUEtiapine (SEROQUEL) 100 MG tablet Take 100 mg by mouth as needed.    . traZODone (DESYREL) 100 MG tablet Take 3 tablets by mouth at bedtime.     Marland Kitchen venlafaxine XR (EFFEXOR-XR) 75 MG 24 hr capsule Take 3 capsules by mouth every morning.    . Vitamin D, Ergocalciferol, (DRISDOL) 1.25 MG (50000 UT) CAPS capsule Take 1 capsule (50,000 Units total) by mouth every 7 (seven) days. 12 capsule 3   Current Facility-Administered Medications  on File Prior to Visit  Medication Dose Route Frequency Provider Last Rate Last Dose  . triamcinolone acetonide (KENALOG) 10 MG/ML injection 10 mg  10 mg Other Once Landis Martins, DPM      . triamcinolone acetonide (KENALOG) 10 MG/ML injection 10 mg  10 mg Other Once Landis Martins, DPM        Allergies  Allergen Reactions  . Naltrexone Other (See Comments)    Panic attack    Objective:  General: Alert and oriented x3 in no acute distress.  Patient has her 54-month old granddaughter in the room with her this visit.  Dermatology: No open lesions bilateral lower extremities, no webspace macerations, no ecchymosis bilateral, all nails x 10 are well manicured.  Vascular: Dorsalis Pedis and Posterior Tibial pedal pulses 2/4, Capillary Fill Time 3 seconds, + pedal hair growth bilateral, no edema bilateral lower extremities, Temperature gradient within normal limits.  Neurology: Johney Maine sensation intact via light touch bilateral.  Musculoskeletal: There is mild pain palpation plantar fascial right greater than left.  There is mild tenderness with palpation at the Achilles on the right and left, there is calcaneal exostosis with mild soft tissue present and decreased ankle rom with knee extending  vs flexed resembling gastroc equnius bilateral, The achilles tendon feels intact with no nodularity or palpable dell, Thompson sign negative, Subtalar and midtarsal joint range of motion is within normal limits, there is no 1st ray hypermobility or forefoot deformity noted bilateral.   Assessment and Plan: Problem List Items Addressed This Visit    None    Visit Diagnoses    Achilles tendinitis, right leg    -  Primary   Tendonitis, Achilles, left       Plantar fasciitis, bilateral         -Complete examination performed -Rediscussed with patient Achilles tendinitis and plantar fasciitis -Dispensed heel lifts -Prescribed prednisone Dosepak to take as instructed and advised patient that  reinjection is an option however since she is painful in multiple areas would recommend her to take medication by mouth -Encouraged patient to consider MRIs if pain continues or EPAT treatment -Patient to return to office as needed or sooner if pain recurs.  Landis Martins, DPM

## 2019-01-17 ENCOUNTER — Ambulatory Visit: Payer: BC Managed Care – PPO | Admitting: Sports Medicine

## 2019-01-21 ENCOUNTER — Other Ambulatory Visit: Payer: Self-pay | Admitting: Sports Medicine

## 2019-01-23 ENCOUNTER — Other Ambulatory Visit: Payer: Self-pay

## 2019-01-23 MED ORDER — ALBUTEROL SULFATE HFA 108 (90 BASE) MCG/ACT IN AERS: 1.0000 | INHALATION_SPRAY | RESPIRATORY_TRACT | 2 refills | Status: DC | PRN

## 2019-02-03 ENCOUNTER — Telehealth: Payer: Self-pay | Admitting: Sports Medicine

## 2019-02-03 ENCOUNTER — Other Ambulatory Visit: Payer: Self-pay | Admitting: Adult Health

## 2019-02-03 NOTE — Telephone Encounter (Signed)
Dr. Stover please advise 

## 2019-02-03 NOTE — Telephone Encounter (Signed)
Pt had to r/s her appt from Friday with Dr. Cannon Kettle until the middle of April due to Coronavirus protocol; pt is requesting refill of Celebrex due to her unable to come into the office to be seen. Please advise.Marland KitchenMarland Kitchen

## 2019-02-04 ENCOUNTER — Other Ambulatory Visit: Payer: Self-pay | Admitting: Sports Medicine

## 2019-02-04 MED ORDER — CELECOXIB 200 MG PO CAPS: ORAL_CAPSULE | ORAL | 2 refills | Status: DC

## 2019-02-04 NOTE — Progress Notes (Signed)
Refill sent to her home delivery pharmacy -Dr. Cannon Kettle

## 2019-02-04 NOTE — Telephone Encounter (Signed)
Refill sent to her pharmacy -Dr S 

## 2019-02-04 NOTE — Telephone Encounter (Signed)
Lm for patient that refill was sent

## 2019-02-07 ENCOUNTER — Ambulatory Visit: Payer: BC Managed Care – PPO | Admitting: Sports Medicine

## 2019-02-27 ENCOUNTER — Other Ambulatory Visit: Payer: Self-pay

## 2019-02-27 ENCOUNTER — Ambulatory Visit (INDEPENDENT_AMBULATORY_CARE_PROVIDER_SITE_OTHER): Payer: Medicare HMO

## 2019-02-27 ENCOUNTER — Ambulatory Visit (INDEPENDENT_AMBULATORY_CARE_PROVIDER_SITE_OTHER): Payer: Medicare HMO | Admitting: Sports Medicine

## 2019-02-27 ENCOUNTER — Encounter: Payer: Self-pay | Admitting: Sports Medicine

## 2019-02-27 VITALS — Temp 96.8°F

## 2019-02-27 DIAGNOSIS — M7662 Achilles tendinitis, left leg: Secondary | ICD-10-CM | POA: Diagnosis not present

## 2019-02-27 DIAGNOSIS — S9032XA Contusion of left foot, initial encounter: Secondary | ICD-10-CM

## 2019-02-27 DIAGNOSIS — M722 Plantar fascial fibromatosis: Secondary | ICD-10-CM

## 2019-02-27 DIAGNOSIS — M7661 Achilles tendinitis, right leg: Secondary | ICD-10-CM

## 2019-02-27 DIAGNOSIS — M79672 Pain in left foot: Secondary | ICD-10-CM | POA: Diagnosis not present

## 2019-02-27 DIAGNOSIS — M79671 Pain in right foot: Secondary | ICD-10-CM

## 2019-02-27 NOTE — Patient Instructions (Signed)

## 2019-02-27 NOTE — Progress Notes (Signed)
Subjective: Ariel King is a 48 y.o. female patient who returns to office for evaluation of Right and left heel pain. Patient reports feet are doing pretty good no pain to her heels besides her feet staying cold admits a new pain in her right hip that radiates down the back of her buttocks states that this pain has been slowly getting worse states also about 4 days ago a lamp fell on the top of her left foot and she has had considerable bruising reports that she ice and elevate and slowly the bruising has gotten better.  Patient denies any pain to the left foot however due to history of trauma would like x-ray. Patient denies any other pedal complaints.   Patient Active Problem List   Diagnosis Date Noted  . Reactive airway disease with wheezing 05/21/2018  . Tobacco consumption 05/21/2018  . Tobacco abuse counseling 05/21/2018  . Achilles tendinitis of both lower extremities 05/21/2018  . Chronic right-sided low back pain with right-sided sciatica 05/22/2017  . History of chlamydia 03/22/2017  . Muscle strain 03/22/2017  . Lumbar spine strain, initial encounter 03/22/2017  . Severe episode of recurrent major depressive disorder, without psychotic features ([place]) 03/05/2017  . Encounter for wellness examination 10/25/2016  . Panic attacks 06/09/2016  . Status post hysterectomy, left ovaries, took cervix. 06/09/2016  . Vitamin D deficiency 06/09/2016  . Adult hypothyroidism 12/19/2015  . Obesity (BMI 30-39.9) 11/03/2013  . Chronic pain syndrome 12/25/2012  . h/o Blood glucose elevated 12/25/2012  . h/o Hypertriglyceridemia 12/25/2012  . Other insomnia 12/25/2012  . Chronic fatigue, unspecified 12/25/2012  . Migraine without aura and without status migrainosus, not intractable 12/25/2012  . Anxiety 03/14/2012  . Migraines 03/14/2012  . Depression 03/14/2012  . Chronic fatigue disorder 03/14/2012    Current Outpatient Medications on File Prior to Visit  Medication Sig Dispense  Refill  . albuterol (PROVENTIL HFA;VENTOLIN HFA) 108 (90 Base) MCG/ACT inhaler Inhale 1-2 puffs into the lungs every 4 (four) hours as needed for wheezing or shortness of breath. 1 Inhaler 2  . ALPRAZolam (XANAX) 0.5 MG tablet Take 1-2 tablets by mouth every 6 (six) hours as needed.    Marland Kitchen amphetamine-dextroamphetamine (ADDERALL) 20 MG tablet Take 1 tablet by mouth 3 (three) times daily.    . B Complex Vitamins (VITAMIN-B COMPLEX) TABS Take 1 tablet by mouth daily.    . celecoxib (CELEBREX) 200 MG capsule TAKE 1 CAPSULE(200 MG) BY MOUTH TWICE DAILY 60 capsule 2  . diclofenac sodium (VOLTAREN) 1 % GEL Apply 4 g topically 4 (four) times daily. 100 g 2  . fluticasone (FLONASE) 50 MCG/ACT nasal spray SPRAY 2 SPRAYS INTO EACH NOSTRILS EVERYDAY 16 g 2  . gabapentin (NEURONTIN) 300 MG capsule TAKE 1 CAPSULE BY MOUTH THREE TIMES A DAY    . ibuprofen (ADVIL,MOTRIN) 600 MG tablet Take 1 tablet (600 mg total) by mouth every 8 (eight) hours as needed for moderate pain (short term use only). 30 tablet 0  . levothyroxine (SYNTHROID, LEVOTHROID) 125 MCG tablet Take 1 tablet (125 mcg total) by mouth daily. 90 tablet 1  . methocarbamol (ROBAXIN) 500 MG tablet TAKE 1 TABLET (500 MG TOTAL) BY MOUTH NIGHTLY AS NEEDED  3  . montelukast (SINGULAIR) 10 MG tablet Take 1 tablet (10 mg total) by mouth at bedtime. 90 tablet 1  . niacin (NIASPAN) 1000 MG CR tablet Take 1 tablet (1,000 mg total) by mouth at bedtime. 90 tablet 3  . predniSONE (STERAPRED UNI-PAK 21 TAB) 10  MG (21) TBPK tablet Take as directed 21 tablet 0  . QUEtiapine (SEROQUEL) 100 MG tablet Take 100 mg by mouth as needed.    . traZODone (DESYREL) 100 MG tablet Take 3 tablets by mouth at bedtime.     Marland Kitchen venlafaxine XR (EFFEXOR-XR) 75 MG 24 hr capsule Take 3 capsules by mouth every morning.    . Vitamin D, Ergocalciferol, (DRISDOL) 1.25 MG (50000 UT) CAPS capsule Take 1 capsule (50,000 Units total) by mouth every 7 (seven) days. 12 capsule 3   Current  Facility-Administered Medications on File Prior to Visit  Medication Dose Route Frequency Provider Last Rate Last Dose  . triamcinolone acetonide (KENALOG) 10 MG/ML injection 10 mg  10 mg Other Once Landis Martins, DPM      . triamcinolone acetonide (KENALOG) 10 MG/ML injection 10 mg  10 mg Other Once Landis Martins, DPM        Allergies  Allergen Reactions  . Naltrexone Other (See Comments)    Panic attack    Objective:  General: Alert and oriented x3 in no acute distress.  Patient has her 24-month old granddaughter in the room with her this visit.  Dermatology: No open lesions bilateral lower extremities, no webspace macerations, ecchymosis noted to the dorsal left foot, all nails x 10 are well manicured.  Vascular: Dorsalis Pedis and Posterior Tibial pedal pulses 2/4, Capillary Fill Time 3 seconds, + pedal hair growth bilateral, no edema bilateral lower extremities, Temperature gradient within normal limits.  Neurology: Johney Maine sensation intact via light touch bilateral.  Musculoskeletal: There is no pain palpation at the left forefoot, plantar fascias, or Achilles bilateral, there is calcaneal exostosis with mild soft tissue swelling present that appears to be improved from prior and decreased ankle rom with knee extending  vs flexed resembling gastroc equnius bilateral, The achilles tendon feels intact with no nodularity or palpable dell, Thompson sign negative, Subtalar and midtarsal joint range of motion is within normal limits, there is no 1st ray hypermobility or forefoot deformity noted bilateral.  X-ray left foot reveals no acute findings   Assessment and Plan: Problem List Items Addressed This Visit    None    Visit Diagnoses    Achilles tendinitis, right leg    -  Primary   Tendonitis, Achilles, left       Plantar fasciitis, bilateral       Bilateral foot pain       Contusion of left foot, initial encounter       Date of injury 02/23/2019 (lamp fell on foot)   Relevant  Orders   DG Foot Complete Left (Completed)       -Complete examination performed -X-rays reviewed -Discussed continued care for contusion of left foot -Recommend rest ice elevation and topical pain creams and rubs and anti-inflammatories as needed -Dispensed Surgitube compression sleeve for patient to wear to assist with edema control until the contusion is resolved on the left foot -Re-discussed with patient the importance of continuing with her heel lifts and stretching to prevent a flareup of her Achilles tendinitis and plantar fasciitis -Patient to return to office as needed or sooner if pain worsens or recurs.  Landis Martins, DPM

## 2019-04-01 ENCOUNTER — Other Ambulatory Visit: Payer: Self-pay

## 2019-04-01 ENCOUNTER — Ambulatory Visit: Payer: Medicare HMO | Admitting: Family Medicine

## 2019-04-16 ENCOUNTER — Other Ambulatory Visit: Payer: Self-pay | Admitting: Family Medicine

## 2019-04-16 DIAGNOSIS — E669 Obesity, unspecified: Secondary | ICD-10-CM

## 2019-04-16 DIAGNOSIS — E039 Hypothyroidism, unspecified: Secondary | ICD-10-CM

## 2019-04-16 DIAGNOSIS — E781 Pure hyperglyceridemia: Secondary | ICD-10-CM

## 2019-04-16 DIAGNOSIS — E559 Vitamin D deficiency, unspecified: Secondary | ICD-10-CM

## 2019-04-16 NOTE — Telephone Encounter (Signed)
Patient needs OV appt made for Tuesday 04/22/2019. MPulliam, CMA/RT(R)

## 2019-04-22 ENCOUNTER — Encounter: Payer: Self-pay | Admitting: Family Medicine

## 2019-04-22 ENCOUNTER — Ambulatory Visit (INDEPENDENT_AMBULATORY_CARE_PROVIDER_SITE_OTHER): Payer: Medicare HMO | Admitting: Family Medicine

## 2019-04-22 ENCOUNTER — Other Ambulatory Visit: Payer: Self-pay

## 2019-04-22 DIAGNOSIS — J3089 Other allergic rhinitis: Secondary | ICD-10-CM | POA: Diagnosis not present

## 2019-04-22 DIAGNOSIS — E039 Hypothyroidism, unspecified: Secondary | ICD-10-CM | POA: Diagnosis not present

## 2019-04-22 DIAGNOSIS — E669 Obesity, unspecified: Secondary | ICD-10-CM

## 2019-04-22 DIAGNOSIS — E559 Vitamin D deficiency, unspecified: Secondary | ICD-10-CM

## 2019-04-22 DIAGNOSIS — Z720 Tobacco use: Secondary | ICD-10-CM

## 2019-04-22 DIAGNOSIS — Z716 Tobacco abuse counseling: Secondary | ICD-10-CM

## 2019-04-22 DIAGNOSIS — J4520 Mild intermittent asthma, uncomplicated: Secondary | ICD-10-CM | POA: Diagnosis not present

## 2019-04-22 MED ORDER — MONTELUKAST SODIUM 10 MG PO TABS: 10.0000 mg | ORAL_TABLET | Freq: Every day | ORAL | 1 refills | Status: DC

## 2019-04-22 MED ORDER — LEVOTHYROXINE SODIUM 125 MCG PO TABS: 125.0000 ug | ORAL_TABLET | Freq: Every day | ORAL | 0 refills | Status: DC

## 2019-04-22 NOTE — Addendum Note (Signed)
Addended by: Lanier Prude D on: 04/22/2019 03:29 PM   Modules accepted: Orders
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
## 2019-04-24 ENCOUNTER — Ambulatory Visit (INDEPENDENT_AMBULATORY_CARE_PROVIDER_SITE_OTHER): Payer: Medicare HMO | Admitting: Family Medicine

## 2019-04-24 ENCOUNTER — Encounter: Payer: Self-pay | Admitting: Family Medicine

## 2019-04-24 ENCOUNTER — Other Ambulatory Visit: Payer: Self-pay

## 2019-04-24 ENCOUNTER — Telehealth: Payer: Self-pay | Admitting: Family Medicine

## 2019-04-24 VITALS — BP 122/83 | HR 94 | Temp 99.2°F | Ht 64.0 in | Wt 196.5 lb

## 2019-04-24 DIAGNOSIS — M6289 Other specified disorders of muscle: Secondary | ICD-10-CM | POA: Diagnosis not present

## 2019-04-24 DIAGNOSIS — M7711 Lateral epicondylitis, right elbow: Secondary | ICD-10-CM | POA: Diagnosis not present

## 2019-04-24 DIAGNOSIS — J3089 Other allergic rhinitis: Secondary | ICD-10-CM | POA: Diagnosis not present

## 2019-04-24 DIAGNOSIS — Z720 Tobacco use: Secondary | ICD-10-CM

## 2019-04-24 DIAGNOSIS — E669 Obesity, unspecified: Secondary | ICD-10-CM

## 2019-04-24 DIAGNOSIS — M7061 Trochanteric bursitis, right hip: Secondary | ICD-10-CM | POA: Diagnosis not present

## 2019-04-24 DIAGNOSIS — J4520 Mild intermittent asthma, uncomplicated: Secondary | ICD-10-CM

## 2019-04-24 DIAGNOSIS — E559 Vitamin D deficiency, unspecified: Secondary | ICD-10-CM | POA: Diagnosis not present

## 2019-04-24 DIAGNOSIS — M5441 Lumbago with sciatica, right side: Secondary | ICD-10-CM | POA: Diagnosis not present

## 2019-04-24 DIAGNOSIS — Z716 Tobacco abuse counseling: Secondary | ICD-10-CM

## 2019-04-24 DIAGNOSIS — E039 Hypothyroidism, unspecified: Secondary | ICD-10-CM | POA: Diagnosis not present

## 2019-04-24 MED ORDER — FLUTICASONE PROPIONATE 50 MCG/ACT NA SUSP: 2.0000 | Freq: Every day | NASAL | 2 refills | Status: DC

## 2019-04-24 NOTE — Telephone Encounter (Signed)
Patient called while office clsd for lunch to request which Elbow device should she purchase   ( Strap or Brace)  --Forwarding message to medical assistant to call pt back w/ advice @ [phone].  --Dion Body

## 2019-04-24 NOTE — Progress Notes (Signed)
Pt here for an acute care OV today   Impression and Recommendations:    1. Lateral epicondylitis of right elbow   2. Acute back pain with sciatica, right   3. Greater trochanteric bursitis of right hip   4. Hamstring tightness     Lateral epicondylitis of right elbow - Plan: Discussed with patient to avoid repetitive wrist extension exercises and pronation of the wrist exercises.  She will ice lateral elbow 15 to 20 minutes 3-4 times a day.  Advised to use a tennis elbow brace.  Showed patient exactly how she would use it and where to place it.  Advise she can get on [email or web address] or [email or web address] or a local pharmacy.  Told her if she does not stop her repetitive activities and constant lifting since she is right-handed-she needs to use her left hand more-this will not improve without these conservative methods.   Acute back pain with sciatica, right - Plan: Ambulatory referral to Physical Therapy, discussed with patient mechanism of sciatica.  She also has very tight hamstrings on that side as well.  Positive straight leg raise, we will send her to physical therapy for evaluation of range of motion, flexibility to work on improving strength and pain.  Told patient this is important to do. Hamstring tightness - Plan: Ambulatory referral to Physical Therapy  Greater trochanteric bursitis of right hip - Plan: Ambulatory referral to Physical Therapy, explained this is likely contributing to her hip pain as well.  Advised ice 15-20 minutes 4-5 times a day and avoid pressure on that side, repetitive activity such as climbing stairs or biking etc.  If after 2 to 3 weeks of conservative treatments, she can return to the clinic for possible greater trochanteric bursitis injection if needed.  -Seen the other day she also needs blood work which I asked my CMA to obtain today per my notes from last office visit    Medications Discontinued During This Encounter  Medication Reason  . ibuprofen  (ADVIL,MOTRIN) 600 MG tablet Patient Preference     Orders Placed This Encounter  Procedures  . Ambulatory referral to Physical Therapy     Education and routine counseling performed. Handouts provided  Gross side effects, risk and benefits, and alternatives of medications and treatment plan in general discussed with patient.  Patient is aware that all medications have potential side effects and we are unable to predict every side effect or drug-drug interaction that may occur.   Patient will call with any questions prior to using medication if they have concerns.    Expresses verbal understanding and consents to current therapy and treatment regimen.  No barriers to understanding were identified.  Red flag symptoms and signs discussed in detail.  Patient expressed understanding regarding what to do in case of emergency\urgent symptoms   Please see AVS handed out to patient at the end of our visit for further patient instructions/ counseling done pertaining to today's office visit.   Return if symptoms worsen or fail to improve with ice on right hip into 3 weeks-may need bursitis injection, for Follow-up in 1 month-recheck of hip bursitis, see how PT is going, and tennis elbow.     Note:  This document was prepared occasionally using Dragon voice recognition software and may include unintentional dictation errors in addition to a scribe.  Mellody Dance, DO 04/24/2019 10:26 AM     --------------------------------------------------------------------------------------------------------------------------------------------------------------------------------------------------------------------------------------------    Subjective:    CC:  Chief Complaint  Patient presents with  .  Arm Pain  . Hip Pain    HPI: Ariel King is a 48 y.o. female who presents to [place] at [facility] today for issues as discussed below.  -Patient patient has right elbow pain  mostly at the outside of her elbow for about 2 weeks now.  She does not recall doing anything repetitive.  It really hurts for her to lift anything with that right upper extremity especially her yeti cup or abduct dog out of the fridge etc.  She has not used anything for or iced it braced it etc.  She is right-handed and has much more developed forearm muscles on the right than the left.  Right buttocks and hip pain for approximately 2 to 3 months now the pain feels like she describes the pain as a soreness that starts in the back of her right buttocks and then travels down her legs often beyond the knee.  She has no loss of sensation or muscular function.  So patient has been resting it and lifting her legs up on a pillow or a chair next to her which does help some however, she is usually constantly in pain.  This is made worse by her pain is just there irregardless of activity.  She cannot identify anything specifically that makes it worse.  Lying, walking etc. everything just makes it worse.  She is never pain-free or comfortable in any position.  Patient has never seen another doctor for this in the past and this is a new problem for her.    Wt Readings from Last 3 Encounters:  04/24/19 196 lb 8 oz (89.1 kg)  09/18/18 184 lb 4.8 oz (83.6 kg)  05/21/18 182 lb 8 oz (82.8 kg)   BP Readings from Last 3 Encounters:  04/24/19 122/83  09/18/18 131/84  05/21/18 110/75   BMI Readings from Last 3 Encounters:  04/24/19 33.73 kg/m  09/18/18 31.64 kg/m  05/21/18 31.33 kg/m     Patient Care Team    Relationship Specialty Notifications Start End  Mellody Dance, DO PCP - General Family Medicine  05/08/16   Chucky May, MD Consulting Physician Psychiatry  08/29/16   Garrel Ridgel, DPM Consulting Physician Podiatry  01/01/18   [facility] [place] Physician Dermatology  01/01/18    Comment: does yrly skin screening.      Dr.  Silver Huguenin, Hoyle Sauer, DO Consulting Physician  Rehabilitation  01/01/18    Comment: spine and back pain  Delsa Bern, MD Consulting Physician Obstetrics and Gynecology  01/02/18      Patient Active Problem List   Diagnosis Date Noted  . Severe episode of recurrent major depressive disorder, without psychotic features ([place]) 03/05/2017    Priority: High  . Panic attacks 06/09/2016    Priority: High  . Adult hypothyroidism 12/19/2015    Priority: High  . Other insomnia 12/25/2012    Priority: High  . Anxiety 03/14/2012    Priority: High  . Depression 03/14/2012    Priority: High  . Obesity (BMI 30-39.9) 11/03/2013    Priority: Medium  . h/o Hypertriglyceridemia 12/25/2012    Priority: Medium  . Reactive airway disease with wheezing 05/21/2018    Priority: Low  . Tobacco consumption 05/21/2018    Priority: Low  . Tobacco abuse counseling 05/21/2018    Priority: Low  . Vitamin D deficiency 06/09/2016    Priority: Low  . Environmental and seasonal allergies 04/22/2019  . Achilles tendinitis of both lower extremities  05/21/2018  . Chronic right-sided low back pain with right-sided sciatica 05/22/2017  . History of chlamydia 03/22/2017  . Muscle strain 03/22/2017  . Lumbar spine strain, initial encounter 03/22/2017  . Encounter for wellness examination 10/25/2016  . Status post hysterectomy, left ovaries, took cervix. 06/09/2016  . Chronic pain syndrome 12/25/2012  . h/o Blood glucose elevated 12/25/2012  . Chronic fatigue, unspecified 12/25/2012  . Migraine without aura and without status migrainosus, not intractable 12/25/2012  . Migraines 03/14/2012  . Chronic fatigue disorder 03/14/2012      Past Medical History:  Diagnosis Date  . Abnormal Pap smear   . Adult hypothyroidism 12/19/2015  . Anemia   . Anxiety   . Bladder disorder   . Chronic fatigue and immune dysfunction syndrome ([place])   . Chronic kidney disease   . Depression   . Depression   . EBV infection   . H/O bladder infections   . H/O joint  problems   . Hx of migraines   . Incontinence   . Thyroid disease      Past Surgical History:  Procedure Laterality Date  . ROBOTIC ASSISTED LAP VAGINAL HYSTERECTOMY    . SYNOVECTOMY WRIST       Family History  Problem Relation Age of Onset  . Diabetes Mother   . Migraines Mother   . Heart attack Mother   . Depression Mother   . Hypertension Mother   . Alcohol abuse Father   . Cancer Father        prostate  . Depression Sister      Social History   Socioeconomic History  . Marital status: Married    Spouse name: Not on file  . Number of children: Not on file  . Years of education: Not on file  . Highest education level: Not on file  Occupational History  . Not on file  Social Needs  . Financial resource strain: Not on file  . Food insecurity    Worry: Not on file    Inability: Not on file  . Transportation needs    Medical: Not on file    Non-medical: Not on file  Tobacco Use  . Smoking status: Former Smoker    Types: Cigarettes  . Smokeless tobacco: Never Used  . Tobacco comment: quit 10 years ago  Substance and Sexual Activity  . Alcohol use: No  . Drug use: No  . Sexual activity: Yes    Partners: Male    Birth control/protection: Other-see comments    Comment: hyst  Lifestyle  . Physical activity    Days per week: Not on file    Minutes per session: Not on file  . Stress: Not on file  Relationships  . Social Herbalist on phone: Not on file    Gets together: Not on file    Attends religious service: Not on file    Active member of club or organization: Not on file    Attends meetings of clubs or organizations: Not on file    Relationship status: Not on file  . Intimate partner violence    Fear of current or ex partner: Not on file    Emotionally abused: Not on file    Physically abused: Not on file    Forced sexual activity: Not on file  Other Topics Concern  . Not on file  Social History Narrative  . Not on file     Current  Meds  Medication Sig  .  albuterol (PROVENTIL HFA;VENTOLIN HFA) 108 (90 Base) MCG/ACT inhaler Inhale 1-2 puffs into the lungs every 4 (four) hours as needed for wheezing or shortness of breath.  . ALPRAZolam (XANAX) 0.5 MG tablet Take 1-2 tablets by mouth every 6 (six) hours as needed.  Marland Kitchen amphetamine-dextroamphetamine (ADDERALL) 20 MG tablet Take 1 tablet by mouth 3 (three) times daily.  . B Complex Vitamins (VITAMIN-B COMPLEX) TABS Take 1 tablet by mouth daily.  . celecoxib (CELEBREX) 200 MG capsule TAKE 1 CAPSULE(200 MG) BY MOUTH TWICE DAILY  . diclofenac sodium (VOLTAREN) 1 % GEL Apply 4 g topically 4 (four) times daily.  . fluticasone (FLONASE) 50 MCG/ACT nasal spray SPRAY 2 SPRAYS INTO EACH NOSTRILS EVERYDAY  . gabapentin (NEURONTIN) 300 MG capsule TAKE 1 CAPSULE BY MOUTH THREE TIMES A DAY  . levothyroxine (SYNTHROID) 125 MCG tablet Take 1 tablet (125 mcg total) by mouth daily.  . methocarbamol (ROBAXIN) 500 MG tablet TAKE 1 TABLET (500 MG TOTAL) BY MOUTH NIGHTLY AS NEEDED  . montelukast (SINGULAIR) 10 MG tablet Take 1 tablet (10 mg total) by mouth at bedtime.  Marland Kitchen QUEtiapine (SEROQUEL) 100 MG tablet Take 100 mg by mouth as needed.  . traZODone (DESYREL) 100 MG tablet Take 3 tablets by mouth at bedtime.   Marland Kitchen venlafaxine XR (EFFEXOR-XR) 75 MG 24 hr capsule Take 3 capsules by mouth every morning.  . Vitamin D, Ergocalciferol, (DRISDOL) 1.25 MG (50000 UT) CAPS capsule Take 1 capsule (50,000 Units total) by mouth every 7 (seven) days.   Current Facility-Administered Medications for the 04/24/19 encounter (Office Visit) with Mellody Dance, DO  Medication  . triamcinolone acetonide (KENALOG) 10 MG/ML injection 10 mg  . triamcinolone acetonide (KENALOG) 10 MG/ML injection 10 mg    Allergies:  Allergies  Allergen Reactions  . Naltrexone Other (See Comments)    Panic attack     Review of Systems: General:   Denies fever, chills, unexplained weight loss.  Optho/Auditory:   Denies visual  changes, blurred vision/LOV Respiratory:   Denies wheeze, DOE more than baseline levels.   Cardiovascular:   Denies chest pain, palpitations, new onset peripheral edema  Gastrointestinal:   Denies nausea, vomiting, diarrhea, abd pain.  Genitourinary: Denies dysuria, freq/ urgency, flank pain or discharge from genitals.  Endocrine:     Denies hot or cold intolerance, polyuria, polydipsia. Musculoskeletal:   Denies unexplained myalgias, joint swelling, unexplained arthralgias, gait problems.  Skin:  Denies new onset rash, suspicious lesions Neurological:     Denies dizziness, unexplained weakness, numbness  Psychiatric/Behavioral:   Denies mood changes, suicidal or homicidal ideations, hallucinations    Objective:   Blood pressure 122/83, pulse 94, temperature 99.2 F (37.3 C), height 5\' 4"  (1.626 m), weight 196 lb 8 oz (89.1 kg), SpO2 98 %. Body mass index is 33.73 kg/m. General:  Well Developed, well nourished, appropriate for stated age.  Neuro:  Alert and oriented,  extra-ocular muscles intact  HEENT:  Normocephalic, atraumatic, neck supple Skin:  no gross rash, warm, pink. Cardiac:  RRR, S1 S2 Respiratory:  ECTA B/L and A/P, Not using accessory muscles, speaking in full sentences- unlabored. Vascular:  Ext warm, no cyanosis apprec.; cap RF less 2 sec. Psych:  No HI/SI, judgement and insight good, Euthymic mood. Full Affect.

## 2019-04-24 NOTE — Patient Instructions (Signed)
Sciatica  Sciatica is pain, numbness, weakness, or tingling along the path of the sciatic nerve. The sciatic nerve starts in the lower back and runs down the back of each leg. The nerve controls the muscles in the lower leg and in the back of the knee. It also provides feeling (sensation) to the back of the thigh, the lower leg, and the sole of the foot. Sciatica is a symptom of another medical condition that pinches or puts pressure on the sciatic nerve. Generally, sciatica only affects one side of the body. Sciatica usually goes away on its own or with treatment. In some cases, sciatica may keep coming back (recur). What are the causes? This condition is caused by pressure on the sciatic nerve, or pinching of the sciatic nerve. This may be the result of:  A disk in between the bones of the spine (vertebrae) bulging out too far (herniated disk).  Age-related changes in the spinal disks (degenerative disk disease).  A pain disorder that affects a muscle in the buttock (piriformis syndrome).  Extra bone growth (bone spur) near the sciatic nerve.  An injury or break (fracture) of the pelvis.  Pregnancy.  Tumor (rare). What increases the risk? The following factors may make you more likely to develop this condition:  Playing sports that place pressure or stress on the spine, such as football or weight lifting.  Having poor strength and flexibility.  A history of back injury.  A history of back surgery.  Sitting for long periods of time.  Doing activities that involve repetitive bending or lifting.  Obesity. What are the signs or symptoms? Symptoms can vary from mild to very severe, and they may include:  Any of these problems in the lower back, leg, hip, or buttock: ? Mild tingling or dull aches. ? Burning sensations. ? Sharp pains.  Numbness in the back of the calf or the sole of the foot.  Leg weakness.  Severe back pain that makes movement difficult. These symptoms  may get worse when you cough, sneeze, or laugh, or when you sit or stand for long periods of time. Being overweight may also make symptoms worse. In some cases, symptoms may recur over time. How is this diagnosed? This condition may be diagnosed based on:  Your symptoms.  A physical exam. Your health care provider may ask you to do certain movements to check whether those movements trigger your symptoms.  You may have tests, including: ? Blood tests. ? X-rays. ? MRI. ? CT scan. How is this treated? In many cases, this condition improves on its own, without any treatment. However, treatment may include:  Reducing or modifying physical activity during periods of pain.  Exercising and stretching to strengthen your abdomen and improve the flexibility of your spine.  Icing and applying heat to the affected area.  Medicines that help: ? To relieve pain and swelling. ? To relax your muscles.  Injections of medicines that help to relieve pain, irritation, and inflammation around the sciatic nerve (steroids).  Surgery. Follow these instructions at home: Medicines  Take over-the-counter and prescription medicines only as told by your health care provider.  Do not drive or operate heavy machinery while taking prescription pain medicine. Managing pain  If directed, apply ice to the affected area. ? Put ice in a plastic bag. ? Place a towel between your skin and the bag. ? Leave the ice on for 20 minutes, 2-3 times a day.  After icing, apply heat to the affected  area before you exercise or as often as told by your health care provider. Use the heat source that your health care provider recommends, such as a moist heat pack or a heating pad. ? Place a towel between your skin and the heat source. ? Leave the heat on for 20-30 minutes. ? Remove the heat if your skin turns bright red. This is especially important if you are unable to feel pain, heat, or cold. You may have a greater risk  of getting burned. Activity  Return to your normal activities as told by your health care provider. Ask your health care provider what activities are safe for you. ? Avoid activities that make your symptoms worse.  Take brief periods of rest throughout the day. Resting in a lying or standing position is usually better than sitting to rest. ? When you rest for longer periods, mix in some mild activity or stretching between periods of rest. This will help to prevent stiffness and pain. ? Avoid sitting for long periods of time without moving. Get up and move around at least one time each hour.  Exercise and stretch regularly, as told by your health care provider.  Do not lift anything that is heavier than 10 lb (4.5 kg) while you have symptoms of sciatica. When you do not have symptoms, you should still avoid heavy lifting, especially repetitive heavy lifting.  When you lift objects, always use proper lifting technique, which includes: ? Bending your knees. ? Keeping the load close to your body. ? Avoiding twisting. General instructions  Use good posture. ? Avoid leaning forward while sitting. ? Avoid hunching over while standing.  Maintain a healthy weight. Excess weight puts extra stress on your back and makes it difficult to maintain good posture.  Wear supportive, comfortable shoes. Avoid wearing high heels.  Avoid sleeping on a mattress that is too soft or too hard. A mattress that is firm enough to support your back when you sleep may help to reduce your pain.  Keep all follow-up visits as told by your health care provider. This is important. Contact a health care provider if:  You have pain that wakes you up when you are sleeping.  You have pain that gets worse when you lie down.  Your pain is worse than you have experienced in the past.  Your pain lasts longer than 4 weeks.  You experience unexplained weight loss. Get help right away if:  You lose control of your  bowel or bladder (incontinence).  You have: ? Weakness in your lower back, pelvis, buttocks, or legs that gets worse. ? Redness or swelling of your back. ? A burning sensation when you urinate. This information is not intended to replace advice given to you by your health care provider. Make sure you discuss any questions you have with your health care provider. Document Released: 10/24/2001 Document Revised: 04/04/2016 Document Reviewed: 07/09/2015 Elsevier Interactive Patient Education  2019 [employer] Inc[employer]      Tennis Elbow  Tennis elbow (lateral epicondylitis) is inflammation of tendons in your outer forearm, near your elbow. Tendons are tissues that connect muscle to bone. When you have tennis elbow, inflammation affects the tendons that you use to bend your wrist and move your hand up. Inflammation occurs in the lower part of the upper arm bone (humerus), where the tendons connect to the bone (lateral epicondyle). Tennis elbow often affects people who play tennis, but anyone may get the condition from repeatedly extending the wrist or turning  the forearm. What are the causes? This condition is usually caused by repeatedly extending the wrist, turning the forearm, and using the hands. It can result from sports or work that requires repetitive forearm movements. In some cases, it may be caused by a sudden injury. What increases the risk? You are more likely to develop tennis elbow if you play tennis or another racket sport. You also have a higher risk if you frequently use your hands for work. Besides people who play tennis, others at greater risk include:  Musicians.  Carpenters, painters, and plumbers.  Cooks.  Cashiers.  People who work in [employer].  Architect workers.  Butchers.  People who use computers. What are the signs or symptoms? Symptoms of this condition include:  Pain and tenderness in the forearm and the outer part of the elbow. Pain may be felt only  when using the arm, or it may be there all the time.  A burning feeling that starts in the elbow and spreads down the forearm.  A weak grip in the hand. How is this diagnosed? This condition may be diagnosed based on:  Your symptoms and medical history.  A physical exam.  X-rays.  MRI. How is this treated? Resting and icing your arm is often the first treatment. Your health care provider may also recommend:  Medicines to reduce pain and inflammation. These may be in the form of a pill, topical gels, or shots of a steroid medicine (cortisone).  An elbow strap to reduce stress on the area.  Physical therapy. This may include massage or exercises.  An elbow brace to restrict the movements that cause symptoms. If these treatments do not help relieve your symptoms, your health care provider may recommend surgery to remove damaged muscle and reattach healthy muscle to bone. Follow these instructions at home: Activity  Rest your elbow and wrist and avoid activities that cause symptoms, as told by your health care provider.  Do physical therapy exercises as instructed.  If you lift an object, lift it with your palm facing up. This reduces stress on your elbow. Lifestyle  If your tennis elbow is caused by sports, check your equipment and make sure that: ? You are using it correctly. ? It is the best fit for you.  If your tennis elbow is caused by work or computer use, take frequent breaks to stretch your arm. Talk with your manager about ways to manage your condition at work. If you have a brace:  Wear the brace or strap as told by your health care provider. Remove it only as told by your health care provider.  Loosen the brace if your fingers tingle, become numb, or turn cold and blue.  Keep the brace clean.  If the brace is not waterproof, ask if you may remove it for bathing. If you must keep the brace on while bathing: ? Do not let it get wet. ? Cover it with a  watertight covering when you take a bath or a shower. General instructions   If directed, put ice on the painful area: ? Put ice in a plastic bag. ? Place a towel between your skin and the bag. ? Leave the ice on for 20 minutes, 2-3 times a day.  Take over-the-counter and prescription medicines only as told by your health care provider.  Keep all follow-up visits as told by your health care provider. This is important. Contact a health care provider if:  You have pain that gets worse or  does not get better with treatment.  You have numbness or weakness in your forearm, hand, or fingers. Summary  Tennis elbow (lateral epicondylitis) is inflammation of tendons in your outer forearm, near your elbow.  Common symptoms include pain and tenderness in your forearm and the outer part of your elbow.  This condition is usually caused by repeatedly extending your wrist, turning your forearm, and using your hands.  The first treatment is often resting and icing your arm to relieve symptoms. Further treatment may include taking medicine, getting physical therapy, wearing a brace or strap, or having surgery. This information is not intended to replace advice given to you by your health care provider. Make sure you discuss any questions you have with your health care provider. Document Released: 10/30/2005 Document Revised: 08/14/2017 Document Reviewed: 08/14/2017 Elsevier Interactive Patient Education  2019 [employer].

## 2019-04-25 LAB — T3, FREE: T3, Free: 2.7 pg/mL (ref 2.0–4.4)

## 2019-04-25 LAB — VITAMIN D 25 HYDROXY (VIT D DEFICIENCY, FRACTURES): Vit D, 25-Hydroxy: 79 ng/mL (ref 30.0–100.0)

## 2019-04-25 LAB — T4, FREE: Free T4: 1.25 ng/dL (ref 0.82–1.77)

## 2019-04-25 LAB — TSH: TSH: 0.891 u[IU]/mL (ref 0.450–4.500)

## 2019-04-28 ENCOUNTER — Other Ambulatory Visit: Payer: Self-pay | Admitting: Adult Health

## 2019-04-28 NOTE — Telephone Encounter (Signed)
LVM informing pt to purchase tennis elbow strap.  Charyl Bigger, CMA

## 2019-04-30 DIAGNOSIS — L578 Other skin changes due to chronic exposure to nonionizing radiation: Secondary | ICD-10-CM | POA: Diagnosis not present

## 2019-04-30 DIAGNOSIS — L814 Other melanin hyperpigmentation: Secondary | ICD-10-CM | POA: Diagnosis not present

## 2019-04-30 DIAGNOSIS — D225 Melanocytic nevi of trunk: Secondary | ICD-10-CM | POA: Diagnosis not present

## 2019-04-30 DIAGNOSIS — B079 Viral wart, unspecified: Secondary | ICD-10-CM | POA: Diagnosis not present

## 2019-04-30 DIAGNOSIS — D2239 Melanocytic nevi of other parts of face: Secondary | ICD-10-CM | POA: Diagnosis not present

## 2019-04-30 DIAGNOSIS — D485 Neoplasm of uncertain behavior of skin: Secondary | ICD-10-CM | POA: Diagnosis not present

## 2019-05-06 ENCOUNTER — Telehealth: Payer: Self-pay | Admitting: Family Medicine

## 2019-05-06 NOTE — Telephone Encounter (Signed)
Patient left VM during lunch returning a call to nurse.

## 2019-05-06 NOTE — Telephone Encounter (Signed)
Called and spoke to the patient - I do not have a message that she was called from our office but I did advise patient of lab results as she has not check message on MyChart. MPulliam, CMA/RT(R)

## 2019-05-08 ENCOUNTER — Ambulatory Visit (INDEPENDENT_AMBULATORY_CARE_PROVIDER_SITE_OTHER): Payer: Medicare HMO | Admitting: Family Medicine

## 2019-05-08 ENCOUNTER — Encounter: Payer: Self-pay | Admitting: Family Medicine

## 2019-05-08 ENCOUNTER — Other Ambulatory Visit: Payer: Self-pay

## 2019-05-08 VITALS — BP 140/88 | HR 95 | Temp 98.2°F | Ht 64.0 in | Wt 205.0 lb

## 2019-05-08 DIAGNOSIS — M5416 Radiculopathy, lumbar region: Secondary | ICD-10-CM

## 2019-05-08 DIAGNOSIS — M7061 Trochanteric bursitis, right hip: Secondary | ICD-10-CM

## 2019-05-08 MED ORDER — PREGABALIN 75 MG PO CAPS: 75.0000 mg | ORAL_CAPSULE | Freq: Two times a day (BID) | ORAL | 1 refills | Status: DC

## 2019-05-08 MED ORDER — METHYLPREDNISOLONE ACETATE 80 MG/ML IJ SUSP
80.0000 mg | Freq: Once | INTRAMUSCULAR | Status: AC
  Administered 2019-05-08: 80 mg via INTRAMUSCULAR

## 2019-05-08 MED ORDER — PREDNISONE 50 MG PO TABS: 50.0000 mg | ORAL_TABLET | Freq: Every day | ORAL | 0 refills | Status: DC

## 2019-05-08 NOTE — Progress Notes (Signed)
Impression and Recommendations:    1. Lumbar back pain with radiculopathy affecting right lower extremity   2. Greater trochanteric bursitis of right hip     Lumbar back pain with radiculopathy affecting right lower extremity/greater trochanteric bursitis -Explained to patient that her pain is not well localized to the greater trochanter currently.  NOt candidate for >er troch inject today -She needs to go to physical therapy! -Risk benefits of medicines discussed with patient.  Trial of steroids as well as Lyrica for neuropathic pain/ sciatica.  Patient has been on both meds before and tolerated well. - Plan: pregabalin (LYRICA) 75 MG capsule,   predniSONE (DELTASONE) 50 MG tablet   Meds ordered this encounter  Medications  . pregabalin (LYRICA) 75 MG capsule    Sig: Take 1 capsule (75 mg total) by mouth 2 (two) times daily.    Dispense:  60 capsule    Refill:  1  . predniSONE (DELTASONE) 50 MG tablet    Sig: Take 1 tablet (50 mg total) by mouth daily.    Dispense:  5 tablet    Refill:  0   Gross side effects, risk and benefits, and alternatives of medications and treatment plan in general discussed with patient.  Patient is aware that all medications have potential side effects and we are unable to predict every side effect or drug-drug interaction that may occur.   Patient will call with any questions prior to using medication if they have concerns.    Expresses verbal understanding and consents to current therapy and treatment regimen.  No barriers to understanding were identified.  Red flag symptoms and signs discussed in detail.  Patient expressed understanding regarding what to do in case of emergency\urgent symptoms  Please see AVS handed out to patient at the end of our visit for further patient instructions/ counseling done pertaining to today's office visit.   Return if symptoms worsen or fail to improve- over the next 2 weeks.  Please go to physical therapy..   Note:  This note was prepared with assistance of Dragon voice recognition software. Occasional wrong-word or sound-a-like substitutions may have occurred due to the inherent limitations of voice recognition software.  Diego Cory Tayvia Faughnan DO 05/08/2019 3:30 PM    --------------------------------------------------------------------------------------------------------------------------------------------------------------------------------------------------------------------------   Subjective:     HPI: Ariel King is a 48 y.o. female who presents to [place] [facility] today for issues as discussed below.   Patient has terrible pain in the right lower back and upper buttocks region.  She was seen recently and referred to physical therapy which she has not started at all.    It hurts her to walk and stretch her leg out straight.  It is worse when she is sitting and tries to get up and walk and the pain is really aggravating.  Also, she has a tough time sleeping.  It will hurt her if she lies on that side-in her buttocks region.  Pain shoots down to her foot.  Goes into entire foot.  Feels numbness.   No loss B/Bl.     Wt Readings from Last 3 Encounters:  05/08/19 205 lb (93 kg)  04/24/19 196 lb 8 oz (89.1 kg)  09/18/18 184 lb 4.8 oz (83.6 kg)   BP Readings from Last 3 Encounters:  05/08/19 140/88  04/24/19 122/83  09/18/18 131/84   Pulse Readings from Last 3 Encounters:  05/08/19 95  04/24/19 94  09/18/18 88   BMI  Readings from Last 3 Encounters:  05/08/19 35.19 kg/m  04/24/19 33.73 kg/m  09/18/18 31.64 kg/m     Patient Care Team    Relationship Specialty Notifications Start End  Mellody Dance, DO PCP - General Family Medicine  05/08/16   Chucky May, MD Consulting Physician Psychiatry  08/29/16   Garrel Ridgel, DPM Consulting Physician Podiatry  01/01/18   [facility]  01/01/18    Comment: does yrly  skin screening.      Dr.  Silver Huguenin, Hoyle Sauer, DO Consulting Physician Rehabilitation  01/01/18    Comment: spine and back pain  Delsa Bern, MD Consulting Physician Obstetrics and Gynecology  01/02/18      Patient Active Problem List   Diagnosis Date Noted  . Severe episode of recurrent major depressive disorder, without psychotic features ([place]) 03/05/2017    Priority: High  . Panic attacks 06/09/2016    Priority: High  . Adult hypothyroidism 12/19/2015    Priority: High  . Other insomnia 12/25/2012    Priority: High  . Anxiety 03/14/2012    Priority: High  . Depression 03/14/2012    Priority: High  . Obesity (BMI 30-39.9) 11/03/2013    Priority: Medium  . h/o Hypertriglyceridemia 12/25/2012    Priority: Medium  . Reactive airway disease with wheezing 05/21/2018    Priority: Low  . Tobacco consumption 05/21/2018    Priority: Low  . Tobacco abuse counseling 05/21/2018    Priority: Low  . Vitamin D deficiency 06/09/2016    Priority: Low  . Environmental and seasonal allergies 04/22/2019  . Achilles tendinitis of both lower extremities 05/21/2018  . Chronic right-sided low back pain with right-sided sciatica 05/22/2017  . History of chlamydia 03/22/2017  . Muscle strain 03/22/2017  . Lumbar spine strain, initial encounter 03/22/2017  . Encounter for wellness examination 10/25/2016  . Status post hysterectomy, left ovaries, took cervix. 06/09/2016  . Chronic pain syndrome 12/25/2012  . h/o Blood glucose elevated 12/25/2012  . Chronic fatigue, unspecified 12/25/2012  . Migraine without aura and without status migrainosus, not intractable 12/25/2012  . Migraines 03/14/2012  . Chronic fatigue disorder 03/14/2012    Past Medical history, Surgical history, Family history, Social history, Allergies and Medications have been entered into the medical record, reviewed and changed as needed.    Current Meds  Medication Sig  . albuterol (PROVENTIL HFA;VENTOLIN HFA) 108  (90 Base) MCG/ACT inhaler Inhale 1-2 puffs into the lungs every 4 (four) hours as needed for wheezing or shortness of breath.  . ALPRAZolam (XANAX) 0.5 MG tablet Take 1-2 tablets by mouth every 6 (six) hours as needed.  Marland Kitchen amphetamine-dextroamphetamine (ADDERALL) 20 MG tablet Take 1 tablet by mouth 3 (three) times daily.  . B Complex Vitamins (VITAMIN-B COMPLEX) TABS Take 1 tablet by mouth daily.  . celecoxib (CELEBREX) 200 MG capsule TAKE 1 CAPSULE(200 MG) BY MOUTH TWICE DAILY  . diclofenac sodium (VOLTAREN) 1 % GEL Apply 4 g topically 4 (four) times daily.  . fluticasone (FLONASE) 50 MCG/ACT nasal spray Place 2 sprays into both nostrils daily.  Marland Kitchen gabapentin (NEURONTIN) 300 MG capsule TAKE 1 CAPSULE BY MOUTH THREE TIMES A DAY  . levothyroxine (SYNTHROID) 125 MCG tablet Take 1 tablet (125 mcg total) by mouth daily.  . methocarbamol (ROBAXIN) 500 MG tablet TAKE 1 TABLET (500 MG TOTAL) BY MOUTH NIGHTLY AS NEEDED  . montelukast (SINGULAIR) 10 MG tablet Take 1 tablet (10 mg total) by mouth at bedtime.  Marland Kitchen QUEtiapine (SEROQUEL)  100 MG tablet Take 100 mg by mouth as needed.  . traZODone (DESYREL) 100 MG tablet Take 3 tablets by mouth at bedtime.   Marland Kitchen venlafaxine XR (EFFEXOR-XR) 75 MG 24 hr capsule Take 3 capsules by mouth every morning.  . Vitamin D, Ergocalciferol, (DRISDOL) 1.25 MG (50000 UT) CAPS capsule Take 1 capsule (50,000 Units total) by mouth every 7 (seven) days.   Current Facility-Administered Medications for the 05/08/19 encounter (Office Visit) with Mellody Dance, DO  Medication  . triamcinolone acetonide (KENALOG) 10 MG/ML injection 10 mg  . triamcinolone acetonide (KENALOG) 10 MG/ML injection 10 mg    Allergies:  Allergies  Allergen Reactions  . Naltrexone Other (See Comments)    Panic attack     Review of Systems:  A fourteen system review of systems was performed and found to be positive as per HPI.   Objective:   Blood pressure 140/88, pulse 95, temperature 98.2 F (36.8  C), height 5\' 4"  (1.626 m), weight 205 lb (93 kg), SpO2 98 %. Body mass index is 35.19 kg/m. General:   Well Developed, well nourished, appropriate for stated age.  Neuro:   Alert and oriented,  extra-ocular muscles intact  HEENT:   Normocephalic, atraumatic, neck supple, no carotid bruits appreciated  Skin:  no gross rash, warm, pink. Cardiac:   Normal chest excursion Respiratory:   Not using accessory muscles, speaking in full sentences- unlabored. Musculoskeletal: Right side-pt TTP over glut med muscle in region where sciatic nerve exits in buttock.  She has no tenderness over vertebral processes.  There is no bruising or rash.  Minimal to no tenderness over her greater trochanter on the right. Vascular:   Ext warm, no cyanosis apprec.; cap RF less 2 sec. Psych:   No HI/SI, judgement and insight good, Euthymic mood. Full Affect.

## 2019-05-08 NOTE — Patient Instructions (Signed)
Sciatica  Sciatica is pain, numbness, weakness, or tingling along the path of the sciatic nerve. The sciatic nerve starts in the lower back and runs down the back of each leg. The nerve controls the muscles in the lower leg and in the back of the knee. It also provides feeling (sensation) to the back of the thigh, the lower leg, and the sole of the foot. Sciatica is a symptom of another medical condition that pinches or puts pressure on the sciatic nerve. Generally, sciatica only affects one side of the body. Sciatica usually goes away on its own or with treatment. In some cases, sciatica may keep coming back (recur). What are the causes? This condition is caused by pressure on the sciatic nerve, or pinching of the sciatic nerve. This may be the result of:  A disk in between the bones of the spine (vertebrae) bulging out too far (herniated disk).  Age-related changes in the spinal disks (degenerative disk disease).  A pain disorder that affects a muscle in the buttock (piriformis syndrome).  Extra bone growth (bone spur) near the sciatic nerve.  An injury or break (fracture) of the pelvis.  Pregnancy.  Tumor (rare). What increases the risk? The following factors may make you more likely to develop this condition:  Playing sports that place pressure or stress on the spine, such as football or weight lifting.  Having poor strength and flexibility.  A history of back injury.  A history of back surgery.  Sitting for long periods of time.  Doing activities that involve repetitive bending or lifting.  Obesity. What are the signs or symptoms? Symptoms can vary from mild to very severe, and they may include:  Any of these problems in the lower back, leg, hip, or buttock: ? Mild tingling or dull aches. ? Burning sensations. ? Sharp pains.  Numbness in the back of the calf or the sole of the foot.  Leg weakness.  Severe back pain that makes movement difficult. These symptoms  may get worse when you cough, sneeze, or laugh, or when you sit or stand for long periods of time. Being overweight may also make symptoms worse. In some cases, symptoms may recur over time. How is this diagnosed? This condition may be diagnosed based on:  Your symptoms.  A physical exam. Your health care provider may ask you to do certain movements to check whether those movements trigger your symptoms.  You may have tests, including: ? Blood tests. ? X-rays. ? MRI. ? CT scan. How is this treated? In many cases, this condition improves on its own, without any treatment. However, treatment may include:  Reducing or modifying physical activity during periods of pain.  Exercising and stretching to strengthen your abdomen and improve the flexibility of your spine.  Icing and applying heat to the affected area.  Medicines that help: ? To relieve pain and swelling. ? To relax your muscles.  Injections of medicines that help to relieve pain, irritation, and inflammation around the sciatic nerve (steroids).  Surgery. Follow these instructions at home: Medicines  Take over-the-counter and prescription medicines only as told by your health care provider.  Do not drive or operate heavy machinery while taking prescription pain medicine. Managing pain  If directed, apply ice to the affected area. ? Put ice in a plastic bag. ? Place a towel between your skin and the bag. ? Leave the ice on for 20 minutes, 2-3 times a day.  After icing, apply heat to the affected  area before you exercise or as often as told by your health care provider. Use the heat source that your health care provider recommends, such as a moist heat pack or a heating pad. ? Place a towel between your skin and the heat source. ? Leave the heat on for 20-30 minutes. ? Remove the heat if your skin turns bright red. This is especially important if you are unable to feel pain, heat, or cold. You may have a greater risk  of getting burned. Activity  Return to your normal activities as told by your health care provider. Ask your health care provider what activities are safe for you. ? Avoid activities that make your symptoms worse.  Take brief periods of rest throughout the day. Resting in a lying or standing position is usually better than sitting to rest. ? When you rest for longer periods, mix in some mild activity or stretching between periods of rest. This will help to prevent stiffness and pain. ? Avoid sitting for long periods of time without moving. Get up and move around at least one time each hour.  Exercise and stretch regularly, as told by your health care provider.  Do not lift anything that is heavier than 10 lb (4.5 kg) while you have symptoms of sciatica. When you do not have symptoms, you should still avoid heavy lifting, especially repetitive heavy lifting.  When you lift objects, always use proper lifting technique, which includes: ? Bending your knees. ? Keeping the load close to your body. ? Avoiding twisting. General instructions  Use good posture. ? Avoid leaning forward while sitting. ? Avoid hunching over while standing.  Maintain a healthy weight. Excess weight puts extra stress on your back and makes it difficult to maintain good posture.  Wear supportive, comfortable shoes. Avoid wearing high heels.  Avoid sleeping on a mattress that is too soft or too hard. A mattress that is firm enough to support your back when you sleep may help to reduce your pain.  Keep all follow-up visits as told by your health care provider. This is important. Contact a health care provider if:  You have pain that wakes you up when you are sleeping.  You have pain that gets worse when you lie down.  Your pain is worse than you have experienced in the past.  Your pain lasts longer than 4 weeks.  You experience unexplained weight loss. Get help right away if:  You lose control of your  bowel or bladder (incontinence).  You have: ? Weakness in your lower back, pelvis, buttocks, or legs that gets worse. ? Redness or swelling of your back. ? A burning sensation when you urinate. This information is not intended to replace advice given to you by your health care provider. Make sure you discuss any questions you have with your health care provider. Document Released: 10/24/2001 Document Revised: 04/04/2016 Document Reviewed: 07/09/2015 Elsevier Interactive Patient Education  2019 Elsevier Inc.    Radicular Pain Radicular pain is a type of pain that spreads from your back or neck along a spinal nerve. Spinal nerves are nerves that leave the spinal cord and go to the muscles. Radicular pain is sometimes called radiculopathy, radiculitis, or a pinched nerve. When you have this type of pain, you may also have weakness, numbness, or tingling in the area of your body that is supplied by the nerve. The pain may feel sharp and burning. Depending on which spinal nerve is affected, the pain may occur in  the:  Neck area (cervical radicular pain). You may also feel pain, numbness, weakness, or tingling in the arms.  Mid-spine area (thoracic radicular pain). You would feel this pain in the back and chest. This type is rare.  Lower back area (lumbar radicular pain). You would feel this pain as low back pain. You may feel pain, numbness, weakness, or tingling in the buttocks or legs. Sciatica is a type of lumbar radicular pain that shoots down the back of the leg. Radicular pain occurs when one of the spinal nerves becomes irritated or squeezed (compressed). It is often caused by something pushing on a spinal nerve, such as one of the bones of the spine (vertebrae) or one of the round cushions between vertebrae (intervertebral disks). This can result from:  An injury.  Wear and tear or aging of a disk.  The growth of a bone spur that pushes on the nerve. Radicular pain often goes away when  you follow instructions from your health care provider for relieving pain at home. Follow these instructions at home: Managing pain      If directed, put ice on the affected area: ? Put ice in a plastic bag. ? Place a towel between your skin and the bag. ? Leave the ice on for 20 minutes, 2-3 times a day.  If directed, apply heat to the affected area as often as told by your health care provider. Use the heat source that your health care provider recommends, such as a moist heat pack or a heating pad. ? Place a towel between your skin and the heat source. ? Leave the heat on for 20-30 minutes. ? Remove the heat if your skin turns bright red. This is especially important if you are unable to feel pain, heat, or cold. You may have a greater risk of getting burned. Activity   Do not sit or rest in bed for long periods of time.  Try to stay as active as possible. Ask your health care provider what type of exercise or activity is best for you.  Avoid activities that make your pain worse, such as bending and lifting.  Do not lift anything that is heavier than 10 lb (4.5 kg), or the limit that you are told, until your health care provider says that it is safe.  Practice using proper technique when lifting items. Proper lifting technique involves bending your knees and rising up.  Do strength and range-of-motion exercises only as told by your health care provider or physical therapist. General instructions  Take over-the-counter and prescription medicines only as told by your health care provider.  Pay attention to any changes in your symptoms.  Keep all follow-up visits as told by your health care provider. This is important. ? Your health care provider may send you to a physical therapist to help with this pain. Contact a health care provider if:  Your pain and other symptoms get worse.  Your pain medicine is not helping.  Your pain has not improved after a few weeks of home  care.  You have a fever. Get help right away if:  You have severe pain, weakness, or numbness.  You have difficulty with bladder or bowel control. Summary  Radicular pain is a type of pain that spreads from your back or neck along a spinal nerve.  When you have radicular pain, you may also have weakness, numbness, or tingling in the area of your body that is supplied by the nerve.  The pain  may feel sharp or burning.  Radicular pain may be treated with ice, heat, medicines, or physical therapy. This information is not intended to replace advice given to you by your health care provider. Make sure you discuss any questions you have with your health care provider. Document Released: 12/07/2004 Document Revised: 05/14/2018 Document Reviewed: 05/14/2018 Elsevier Interactive Patient Education  2019 [employer].

## 2019-05-23 DIAGNOSIS — M7061 Trochanteric bursitis, right hip: Secondary | ICD-10-CM | POA: Diagnosis not present

## 2019-05-23 DIAGNOSIS — M545 Low back pain: Secondary | ICD-10-CM | POA: Diagnosis not present

## 2019-05-23 DIAGNOSIS — M5431 Sciatica, right side: Secondary | ICD-10-CM | POA: Diagnosis not present

## 2019-05-23 DIAGNOSIS — M6289 Other specified disorders of muscle: Secondary | ICD-10-CM | POA: Diagnosis not present

## 2019-05-23 DIAGNOSIS — M5416 Radiculopathy, lumbar region: Secondary | ICD-10-CM | POA: Diagnosis not present

## 2019-05-23 DIAGNOSIS — M2560 Stiffness of unspecified joint, not elsewhere classified: Secondary | ICD-10-CM | POA: Diagnosis not present

## 2019-05-23 DIAGNOSIS — R262 Difficulty in walking, not elsewhere classified: Secondary | ICD-10-CM | POA: Diagnosis not present

## 2019-05-27 ENCOUNTER — Ambulatory Visit: Payer: BC Managed Care – PPO | Admitting: Family Medicine

## 2019-05-27 ENCOUNTER — Telehealth: Payer: Self-pay | Admitting: Family Medicine

## 2019-05-27 DIAGNOSIS — M2560 Stiffness of unspecified joint, not elsewhere classified: Secondary | ICD-10-CM | POA: Diagnosis not present

## 2019-05-27 DIAGNOSIS — M5416 Radiculopathy, lumbar region: Secondary | ICD-10-CM | POA: Diagnosis not present

## 2019-05-27 DIAGNOSIS — M7061 Trochanteric bursitis, right hip: Secondary | ICD-10-CM | POA: Diagnosis not present

## 2019-05-27 DIAGNOSIS — R262 Difficulty in walking, not elsewhere classified: Secondary | ICD-10-CM | POA: Diagnosis not present

## 2019-05-27 DIAGNOSIS — M6289 Other specified disorders of muscle: Secondary | ICD-10-CM | POA: Diagnosis not present

## 2019-05-27 DIAGNOSIS — G8929 Other chronic pain: Secondary | ICD-10-CM

## 2019-05-27 DIAGNOSIS — M5431 Sciatica, right side: Secondary | ICD-10-CM | POA: Diagnosis not present

## 2019-05-27 DIAGNOSIS — M778 Other enthesopathies, not elsewhere classified: Secondary | ICD-10-CM

## 2019-05-27 DIAGNOSIS — M545 Low back pain: Secondary | ICD-10-CM | POA: Diagnosis not present

## 2019-05-27 NOTE — Telephone Encounter (Signed)
Patient called states  [facility] required an Order for them to try Dry Needleing on patient's back & also new referral for Rt elbow tendonitis treatment.  --Forwarding request to medical assistant for review.& referral to :   [facility]  Physical therapy clinic in [place], [place]   Address: [address], [place], [place] [address]  Hours:  Open ? Closes 6PM                          Phone: [phone]  --glh

## 2019-05-27 NOTE — Telephone Encounter (Signed)
Please review and advise. MPulliam, CMA/RT(R)  

## 2019-05-28 NOTE — Telephone Encounter (Signed)
Sure you can send them a new order

## 2019-05-28 NOTE — Telephone Encounter (Signed)
Referral placed. MPulliam, CMA/RT(R)

## 2019-05-30 DIAGNOSIS — R262 Difficulty in walking, not elsewhere classified: Secondary | ICD-10-CM | POA: Diagnosis not present

## 2019-05-30 DIAGNOSIS — M7061 Trochanteric bursitis, right hip: Secondary | ICD-10-CM | POA: Diagnosis not present

## 2019-05-30 DIAGNOSIS — M2560 Stiffness of unspecified joint, not elsewhere classified: Secondary | ICD-10-CM | POA: Diagnosis not present

## 2019-05-30 DIAGNOSIS — M545 Low back pain: Secondary | ICD-10-CM | POA: Diagnosis not present

## 2019-05-30 DIAGNOSIS — M5431 Sciatica, right side: Secondary | ICD-10-CM | POA: Diagnosis not present

## 2019-05-30 DIAGNOSIS — M5416 Radiculopathy, lumbar region: Secondary | ICD-10-CM | POA: Diagnosis not present

## 2019-05-30 DIAGNOSIS — M6289 Other specified disorders of muscle: Secondary | ICD-10-CM | POA: Diagnosis not present

## 2019-06-03 DIAGNOSIS — R262 Difficulty in walking, not elsewhere classified: Secondary | ICD-10-CM | POA: Diagnosis not present

## 2019-06-03 DIAGNOSIS — M2560 Stiffness of unspecified joint, not elsewhere classified: Secondary | ICD-10-CM | POA: Diagnosis not present

## 2019-06-03 DIAGNOSIS — M6289 Other specified disorders of muscle: Secondary | ICD-10-CM | POA: Diagnosis not present

## 2019-06-03 DIAGNOSIS — M7061 Trochanteric bursitis, right hip: Secondary | ICD-10-CM | POA: Diagnosis not present

## 2019-06-03 DIAGNOSIS — M5416 Radiculopathy, lumbar region: Secondary | ICD-10-CM | POA: Diagnosis not present

## 2019-06-03 DIAGNOSIS — M545 Low back pain: Secondary | ICD-10-CM | POA: Diagnosis not present

## 2019-06-03 DIAGNOSIS — M5431 Sciatica, right side: Secondary | ICD-10-CM | POA: Diagnosis not present

## 2019-06-03 IMAGING — MR MR HEEL *L* W/O CM
5 series · 40 of 40 positions shown · non-contrast
Comparison: None.

CLINICAL DATA: Patient fell injuring left ankle on 09/07/2017.
Presents complaining of lateral ankle pain radiating along the left
foot were since onset and activity.

EXAM:
MR OF THE LEFT ANKLEWITHOUT CONTRAST
TECHNIQUE: Multiplanar, multisequence MR imaging of the ankle was performed. No
intravenous contrast was administered.

[Series 3: PD fat-sat · axial · 3.0mm · 0.56mm/px · z∈[-80,+14]mm · 7 of 27 slices shown]
[im 1/27]
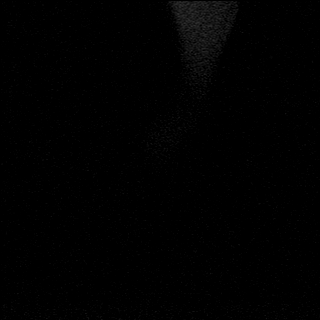
[im 5/27]
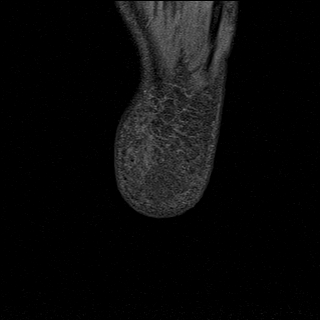
[im 9/27]
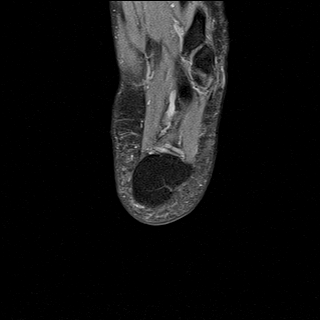
[im 14/27]
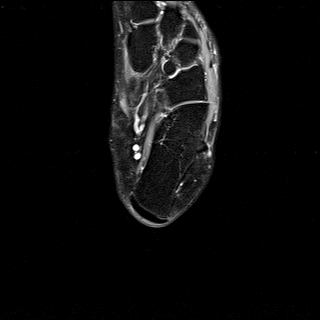
[im 18/27]
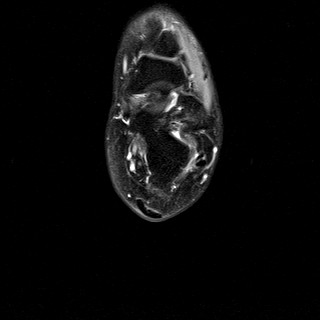
[im 22/27]
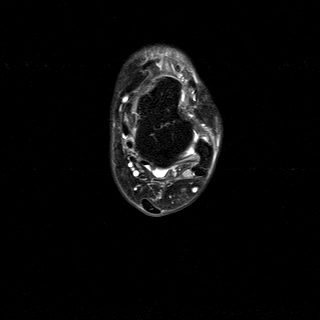
[im 27/27]
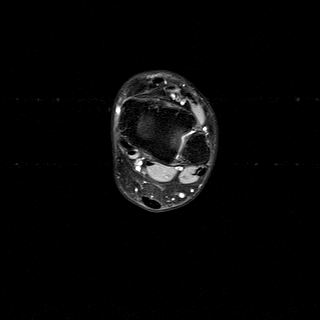

[Series 4: T2 fat-sat · axial · 3.0mm · 0.56mm/px · z∈[-80,+14]mm · 8 of 27 slices shown (1 of 3)]
[im 1/27]
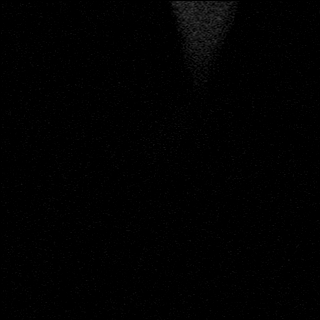
[im 4/27]
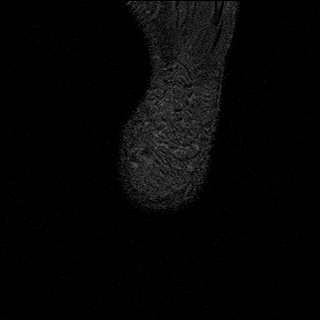
[im 8/27]
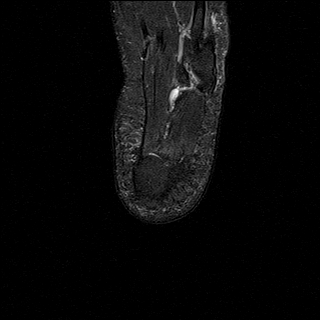
[im 12/27]
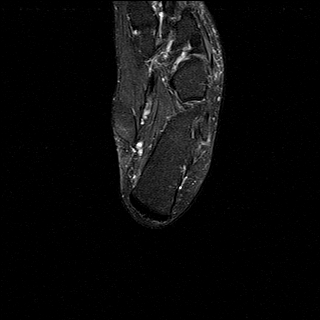
[im 15/27]
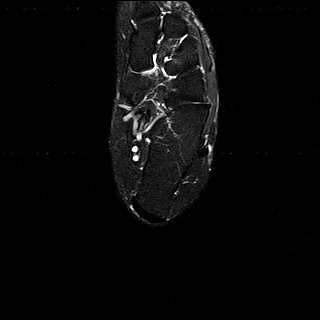
[im 19/27]
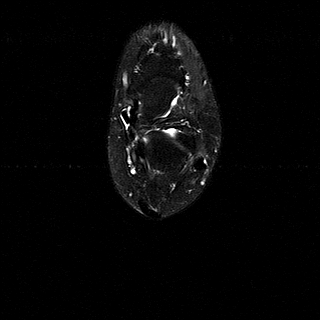
[im 23/27]
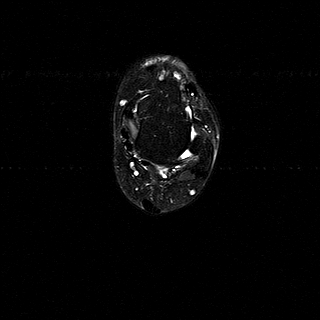
[im 27/27]
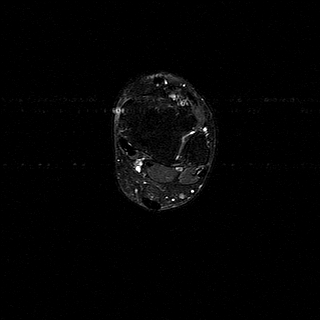

[Series 5: T2 fat-sat · coronal · 3.0mm · 0.56mm/px · 11 of 39 slices shown (2 of 3)]
[im 1/39]
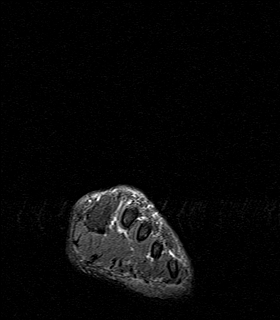
[im 4/39]
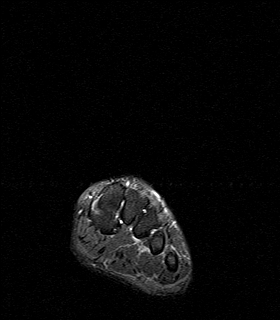
[im 8/39]
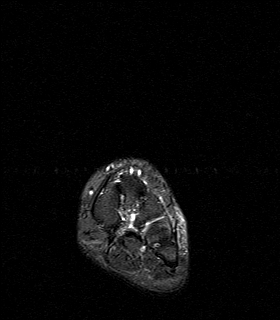
[im 12/39]
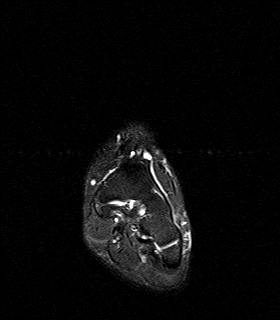
[im 16/39]
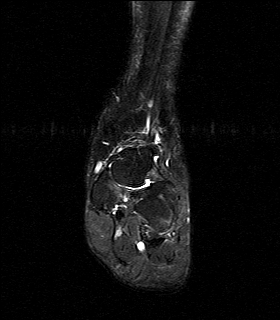
[im 20/39]
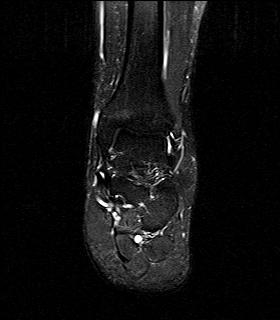
[im 23/39]
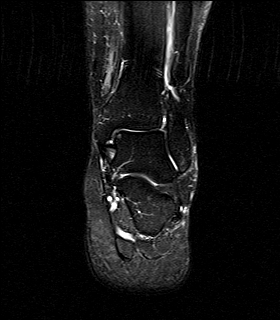
[im 27/39]
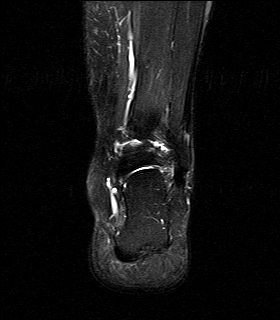
[im 31/39]
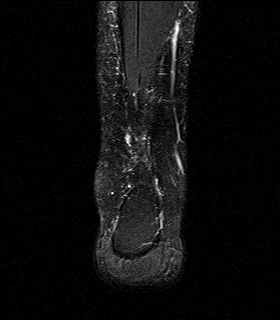
[im 35/39]
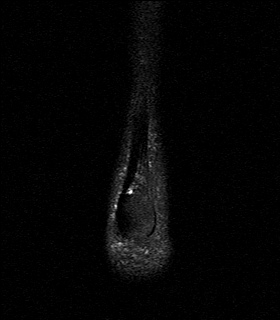
[im 39/39]
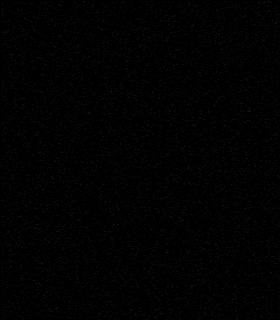

[Series 6: T1 · sagittal · 3.0mm · 0.56mm/px · 7 of 25 slices shown]
[im 1/25]
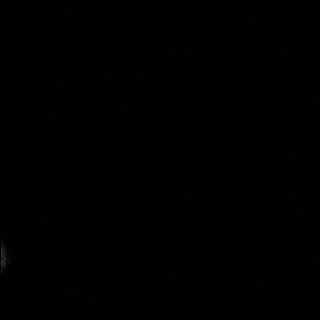
[im 5/25]
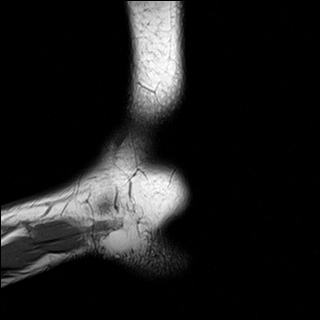
[im 9/25]
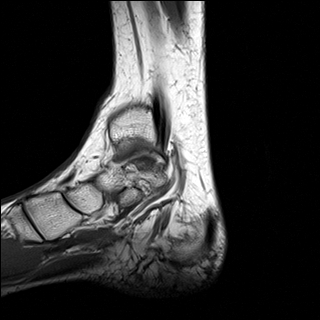
[im 13/25]
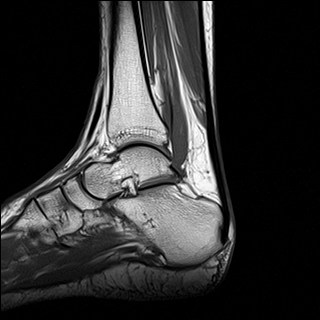
[im 17/25]
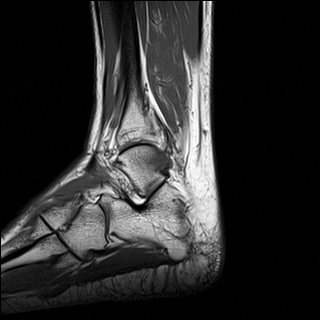
[im 21/25]
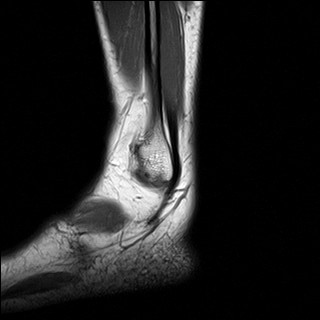
[im 25/25]
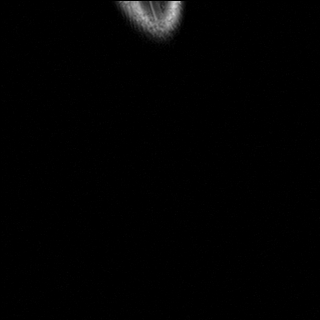

[Series 7: T2 fat-sat · sagittal · 3.0mm · 0.70mm/px · 7 of 25 slices shown (3 of 3)]
[im 1/25]
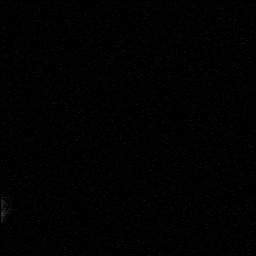
[im 5/25]
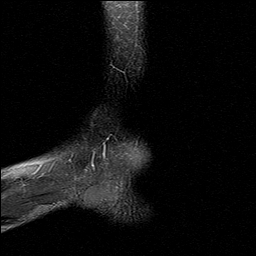
[im 9/25]
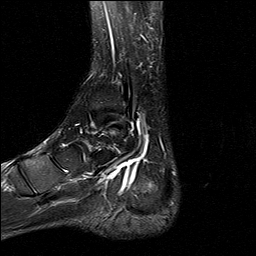
[im 13/25]
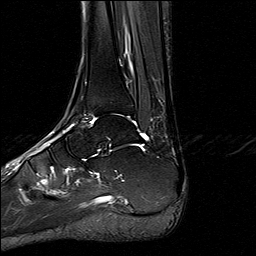
[im 17/25]
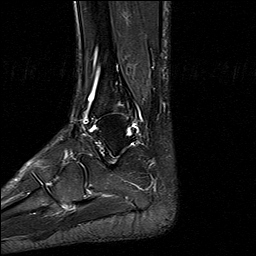
[im 21/25]
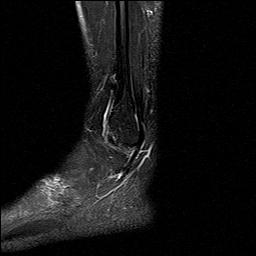
[im 25/25]
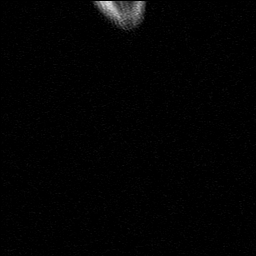

[40 of 40 positions shown; findings below may reference images not displayed]

FINDINGS: TENDONS

Peroneal: Intact peroneus longus and peroneus brevis tendons.

Posteromedial: Intact tibialis posterior, flexor hallucis longus and
flexor digitorum longus tendons.

Anterior: Intact tibialis anterior, extensor hallucis longus and
extensor digitorum longus tendons.

Achilles: Intact.

Plantar Fascia: Intact.

LIGAMENTS

Lateral: Intact anterior and posterior talofibular ligaments and
calcaneofibular ligament.

Medial: Intact superficial and deep deltoid ligament.

CARTILAGE

Ankle Joint: No focal chondral defect

Subtalar Joints/Sinus Tarsi: No joint effusion or chondral defect.
No abnormality of the cervical ligaments within the sinus tarsi.

Bones: Faint marrow edema of the distal fibula without avulsion.
Small enthesophyte off the posterior calcaneus. Small bone island at
the base of the sustentaculum.

Other: None
IMPRESSION: 1. Faint marrow edema/contusion of the distal fibula without
avulsion.
2. Intact tendons and ligaments crossing the ankle joint.
3. No joint effusion.

## 2019-06-05 DIAGNOSIS — M6289 Other specified disorders of muscle: Secondary | ICD-10-CM | POA: Diagnosis not present

## 2019-06-05 DIAGNOSIS — M2560 Stiffness of unspecified joint, not elsewhere classified: Secondary | ICD-10-CM | POA: Diagnosis not present

## 2019-06-05 DIAGNOSIS — M5416 Radiculopathy, lumbar region: Secondary | ICD-10-CM | POA: Diagnosis not present

## 2019-06-05 DIAGNOSIS — R262 Difficulty in walking, not elsewhere classified: Secondary | ICD-10-CM | POA: Diagnosis not present

## 2019-06-05 DIAGNOSIS — M7061 Trochanteric bursitis, right hip: Secondary | ICD-10-CM | POA: Diagnosis not present

## 2019-06-05 DIAGNOSIS — M5431 Sciatica, right side: Secondary | ICD-10-CM | POA: Diagnosis not present

## 2019-06-05 DIAGNOSIS — M545 Low back pain: Secondary | ICD-10-CM | POA: Diagnosis not present

## 2019-06-06 DIAGNOSIS — M7061 Trochanteric bursitis, right hip: Secondary | ICD-10-CM | POA: Diagnosis not present

## 2019-06-06 DIAGNOSIS — M545 Low back pain: Secondary | ICD-10-CM | POA: Diagnosis not present

## 2019-06-06 DIAGNOSIS — M2560 Stiffness of unspecified joint, not elsewhere classified: Secondary | ICD-10-CM | POA: Diagnosis not present

## 2019-06-06 DIAGNOSIS — M6289 Other specified disorders of muscle: Secondary | ICD-10-CM | POA: Diagnosis not present

## 2019-06-06 DIAGNOSIS — R262 Difficulty in walking, not elsewhere classified: Secondary | ICD-10-CM | POA: Diagnosis not present

## 2019-06-06 DIAGNOSIS — M5431 Sciatica, right side: Secondary | ICD-10-CM | POA: Diagnosis not present

## 2019-06-06 DIAGNOSIS — M5416 Radiculopathy, lumbar region: Secondary | ICD-10-CM | POA: Diagnosis not present

## 2019-06-10 DIAGNOSIS — M5416 Radiculopathy, lumbar region: Secondary | ICD-10-CM | POA: Diagnosis not present

## 2019-06-10 DIAGNOSIS — M2560 Stiffness of unspecified joint, not elsewhere classified: Secondary | ICD-10-CM | POA: Diagnosis not present

## 2019-06-10 DIAGNOSIS — M545 Low back pain: Secondary | ICD-10-CM | POA: Diagnosis not present

## 2019-06-10 DIAGNOSIS — R262 Difficulty in walking, not elsewhere classified: Secondary | ICD-10-CM | POA: Diagnosis not present

## 2019-06-10 DIAGNOSIS — M5431 Sciatica, right side: Secondary | ICD-10-CM | POA: Diagnosis not present

## 2019-06-10 DIAGNOSIS — M6289 Other specified disorders of muscle: Secondary | ICD-10-CM | POA: Diagnosis not present

## 2019-06-10 DIAGNOSIS — M7061 Trochanteric bursitis, right hip: Secondary | ICD-10-CM | POA: Diagnosis not present

## 2019-06-12 DIAGNOSIS — R262 Difficulty in walking, not elsewhere classified: Secondary | ICD-10-CM | POA: Diagnosis not present

## 2019-06-12 DIAGNOSIS — M6289 Other specified disorders of muscle: Secondary | ICD-10-CM | POA: Diagnosis not present

## 2019-06-12 DIAGNOSIS — M5431 Sciatica, right side: Secondary | ICD-10-CM | POA: Diagnosis not present

## 2019-06-12 DIAGNOSIS — M5416 Radiculopathy, lumbar region: Secondary | ICD-10-CM | POA: Diagnosis not present

## 2019-06-12 DIAGNOSIS — M7061 Trochanteric bursitis, right hip: Secondary | ICD-10-CM | POA: Diagnosis not present

## 2019-06-12 DIAGNOSIS — M2560 Stiffness of unspecified joint, not elsewhere classified: Secondary | ICD-10-CM | POA: Diagnosis not present

## 2019-06-12 DIAGNOSIS — M545 Low back pain: Secondary | ICD-10-CM | POA: Diagnosis not present

## 2019-06-13 DIAGNOSIS — M5416 Radiculopathy, lumbar region: Secondary | ICD-10-CM | POA: Diagnosis not present

## 2019-06-13 DIAGNOSIS — M545 Low back pain: Secondary | ICD-10-CM | POA: Diagnosis not present

## 2019-06-13 DIAGNOSIS — M2560 Stiffness of unspecified joint, not elsewhere classified: Secondary | ICD-10-CM | POA: Diagnosis not present

## 2019-06-13 DIAGNOSIS — M5431 Sciatica, right side: Secondary | ICD-10-CM | POA: Diagnosis not present

## 2019-06-13 DIAGNOSIS — M6289 Other specified disorders of muscle: Secondary | ICD-10-CM | POA: Diagnosis not present

## 2019-06-13 DIAGNOSIS — R262 Difficulty in walking, not elsewhere classified: Secondary | ICD-10-CM | POA: Diagnosis not present

## 2019-06-13 DIAGNOSIS — M7061 Trochanteric bursitis, right hip: Secondary | ICD-10-CM | POA: Diagnosis not present

## 2019-06-18 ENCOUNTER — Other Ambulatory Visit: Payer: Self-pay | Admitting: Family Medicine

## 2019-07-09 DIAGNOSIS — D485 Neoplasm of uncertain behavior of skin: Secondary | ICD-10-CM | POA: Diagnosis not present

## 2019-07-09 DIAGNOSIS — B079 Viral wart, unspecified: Secondary | ICD-10-CM | POA: Diagnosis not present

## 2019-07-17 ENCOUNTER — Telehealth: Payer: Self-pay | Admitting: Family Medicine

## 2019-07-17 NOTE — Telephone Encounter (Signed)
Advised pt that she will need OV to discuss pain medication changes.  Pt expressed understanding and is agreeable.  Pt transferred to front desk to schedule appt.  Charyl Bigger, CMA

## 2019-07-17 NOTE — Telephone Encounter (Signed)
Patient Ariel King) needs another PT referral to continue her PT. Also states that her pain medication is not working and needs a Careers information officer for a different one and sent to the [facility] in [place].   --- Forwarding to clinical pool --- AM

## 2019-08-01 ENCOUNTER — Ambulatory Visit: Payer: BC Managed Care – PPO | Admitting: Family Medicine

## 2019-08-13 ENCOUNTER — Encounter: Payer: Self-pay | Admitting: Family Medicine

## 2019-08-13 ENCOUNTER — Ambulatory Visit (INDEPENDENT_AMBULATORY_CARE_PROVIDER_SITE_OTHER): Payer: Medicare HMO | Admitting: Family Medicine

## 2019-08-13 ENCOUNTER — Other Ambulatory Visit: Payer: Self-pay

## 2019-08-13 VITALS — BP 109/73 | HR 87 | Ht 64.0 in | Wt 190.0 lb

## 2019-08-13 DIAGNOSIS — M5416 Radiculopathy, lumbar region: Secondary | ICD-10-CM | POA: Diagnosis not present

## 2019-08-13 DIAGNOSIS — G8929 Other chronic pain: Secondary | ICD-10-CM

## 2019-08-13 DIAGNOSIS — M5441 Lumbago with sciatica, right side: Secondary | ICD-10-CM

## 2019-08-13 NOTE — Patient Instructions (Signed)
Please call Dr. Sheffield Slider at [facility] for a follow-up office visit for possible injections for the back.  If you have any problems or concerns please let us know as were happy to refer you again if need be

## 2019-08-13 NOTE — Progress Notes (Signed)
Impression and Recommendations:    1. Lumbar back pain with radiculopathy affecting right lower extremity   2. Chronic right-sided low back pain with right-sided sciatica     Chronic Right-Sided Low Back Pain with Right-Sided Sciatica - Extensively discussed symptoms with patient today. - Lengthy conversation held and all questions answered.  - Discussed recommendation for referral to Sports Medicine / Orthopedics today.  - Advised patient of benefits of obtaining specialist assessment and intervention. - Reviewed that specialist will be able to obtain appropriate imaging PRN.  - Per patient, has continued taking Neurontin along with Lyrica and "feeling funny." - Advised patient to DISCONTINUE Neurontin and ONLY take Lyrica.  This was told to pt in the past.   - As patient experienced relief with specialist through [facility] in the past, we determined it would be best for patient call this specialist and seek further assessment / advice regarding non-conservative management/ epidural steroids/ trans foraminal injections etc etc  - Discussed red flag neurological symptoms such as loss of urinary or bowel control.  Reviewed that this is a medical emergency requiring immediate care.  Patient understands to call 911 if red flag symptoms occur.  - Discussed that if patient continues to fail more conservative management of Lyrica and physical therapy, she will need further referrals to specialist.  - Will continue to monitor.   Medications Discontinued During This Encounter  Medication Reason  . predniSONE (DELTASONE) 50 MG tablet Completed Course  . gabapentin (NEURONTIN) 300 MG capsule      Gross side effects, risk and benefits, and alternatives of medications and treatment plan in general discussed with patient.  Patient is aware that all medications have potential side effects and we are unable to predict every side effect or drug-drug interaction that may occur.   Patient will  call with any questions prior to using medication if they have concerns.    Expresses verbal understanding and consents to current therapy and treatment regimen.  No barriers to understanding were identified.  Red flag symptoms and signs discussed in detail.  Patient expressed understanding regarding what to do in case of emergency\urgent symptoms  Please see AVS handed out to patient at the end of our visit for further patient instructions/ counseling done pertaining to today's office visit.   Return for Early December or so for a complete physical and come fasting..     Note:  This note was prepared with assistance of Dragon voice recognition software. Occasional wrong-word or sound-a-like substitutions may have occurred due to the inherent limitations of voice recognition software.   This document serves as a record of services personally performed by Mellody Dance, DO. It was created on her behalf by Toni Amend, a trained medical scribe. The creation of this record is based on the scribe's personal observations and the provider's statements to them.   I have reviewed the above medical documentation for accuracy and completeness and I concur.  Mellody Dance, DO 08/14/2019 11:13 AM        --------------------------------------------------------------------------------------------------------------------------------------------------------------------------------------------------------------------------------------------    Subjective:     HPI: Ariel King is a 48 y.o. female who presents to [facility] today for issues as discussed below.  - Chronic Right-Sided Low Back Pain with Right-Sided Sciatica  Patient continues experiencing pain.  Notes she did physical therapy; thought that while she was doing this, it helped "some, but not 100%."  Hasn't done physical therapy since August.  Restarted work in  September and has been "trying  to get herself settled."  States she also "ran out of visits with physical therapy and that's why I haven't gone."  Notes she's still experiencing pain, especially after she stopped PT.  Her pain is worst in her right lower back, around to the right hip, down the leg, and into the front of her lower leg beyond the knee.  Says the pain "goes down like this."  States the Lyrica "made me feel weird," "I don't know; I didn't like the the way it made me feel."  Patient states she's been taking Neurontin/gabapentin daily.  Says she was taking Neurontin/gabapentin along with the Lyrica.  Says "maybe one time in the past month," her leg felt numbed, and this contributed to something the patient feels was a "fall against the sink" or reported loss of function.  States she did experience some pain relief in the past through the specialist she saw through [facility] [place] for related pain.   Wt Readings from Last 3 Encounters:  08/13/19 190 lb (86.2 kg)  05/08/19 205 lb (93 kg)  04/24/19 196 lb 8 oz (89.1 kg)   BP Readings from Last 3 Encounters:  08/13/19 109/73  05/08/19 140/88  04/24/19 122/83   Pulse Readings from Last 3 Encounters:  08/13/19 87  05/08/19 95  04/24/19 94   BMI Readings from Last 3 Encounters:  08/13/19 32.61 kg/m  05/08/19 35.19 kg/m  04/24/19 33.73 kg/m     Patient Care Team    Relationship Specialty Notifications Start End  Mellody Dance, DO PCP - General Family Medicine  05/08/16   Chucky May, MD Consulting Physician Psychiatry  08/29/16   Garrel Ridgel, DPM Consulting Physician Podiatry  01/01/18   [facility] Physician Dermatology  01/01/18    Comment: does yrly skin screening.      Dr.  Silver Huguenin, Hoyle Sauer, DO Consulting Physician Rehabilitation  01/01/18    Comment: spine and back pain  Delsa Bern, MD Consulting Physician Obstetrics and Gynecology  01/02/18      Patient Active Problem List   Diagnosis Date Noted  . Severe  episode of recurrent major depressive disorder, without psychotic features ([place]) 03/05/2017    Priority: High  . Panic attacks 06/09/2016    Priority: High  . Adult hypothyroidism 12/19/2015    Priority: High  . Other insomnia 12/25/2012    Priority: High  . Anxiety 03/14/2012    Priority: High  . Depression 03/14/2012    Priority: High  . Obesity (BMI 30-39.9) 11/03/2013    Priority: Medium  . h/o Hypertriglyceridemia 12/25/2012    Priority: Medium  . Reactive airway disease with wheezing 05/21/2018    Priority: Low  . Tobacco consumption 05/21/2018    Priority: Low  . Tobacco abuse counseling 05/21/2018    Priority: Low  . Vitamin D deficiency 06/09/2016    Priority: Low  . Environmental and seasonal allergies 04/22/2019  . Achilles tendinitis of both lower extremities 05/21/2018  . Chronic right-sided low back pain with right-sided sciatica 05/22/2017  . History of chlamydia 03/22/2017  . Muscle strain 03/22/2017  . Lumbar spine strain, initial encounter 03/22/2017  . Encounter for wellness examination 10/25/2016  . Status post hysterectomy, left ovaries, took cervix. 06/09/2016  . Chronic pain syndrome 12/25/2012  . h/o Blood glucose elevated 12/25/2012  . Chronic fatigue, unspecified 12/25/2012  . Migraine without aura and without status migrainosus, not intractable 12/25/2012  . Migraines 03/14/2012  . Chronic fatigue  disorder 03/14/2012    Past Medical history, Surgical history, Family history, Social history, Allergies and Medications have been entered into the medical record, reviewed and changed as needed.    Current Meds  Medication Sig  . albuterol (PROVENTIL HFA;VENTOLIN HFA) 108 (90 Base) MCG/ACT inhaler Inhale 1-2 puffs into the lungs every 4 (four) hours as needed for wheezing or shortness of breath.  . ALPRAZolam (XANAX) 0.5 MG tablet Take 1-2 tablets by mouth every 6 (six) hours as needed.  Marland Kitchen amphetamine-dextroamphetamine (ADDERALL) 20 MG tablet Take 1  tablet by mouth 3 (three) times daily.  . B Complex Vitamins (VITAMIN-B COMPLEX) TABS Take 1 tablet by mouth daily.  . celecoxib (CELEBREX) 200 MG capsule TAKE 1 CAPSULE(200 MG) BY MOUTH TWICE DAILY  . diclofenac sodium (VOLTAREN) 1 % GEL Apply 4 g topically 4 (four) times daily.  . fluticasone (FLONASE) 50 MCG/ACT nasal spray USE 2 SPRAYS IN EACH NOSTRIL EVERY DAY  . levothyroxine (SYNTHROID) 125 MCG tablet Take 1 tablet (125 mcg total) by mouth daily.  . methocarbamol (ROBAXIN) 500 MG tablet TAKE 1 TABLET (500 MG TOTAL) BY MOUTH NIGHTLY AS NEEDED  . montelukast (SINGULAIR) 10 MG tablet Take 1 tablet (10 mg total) by mouth at bedtime.  . pregabalin (LYRICA) 75 MG capsule Take 1 capsule (75 mg total) by mouth 2 (two) times daily.  . QUEtiapine (SEROQUEL) 100 MG tablet Take 100 mg by mouth as needed.  . traZODone (DESYREL) 100 MG tablet Take 3 tablets by mouth at bedtime.   Marland Kitchen venlafaxine XR (EFFEXOR-XR) 75 MG 24 hr capsule Take 3 capsules by mouth every morning.  . Vitamin D, Ergocalciferol, (DRISDOL) 1.25 MG (50000 UT) CAPS capsule Take 1 capsule (50,000 Units total) by mouth every 7 (seven) days.  . [DISCONTINUED] predniSONE (DELTASONE) 50 MG tablet Take 1 tablet (50 mg total) by mouth daily.   Current Facility-Administered Medications for the 08/13/19 encounter (Office Visit) with Mellody Dance, DO  Medication  . triamcinolone acetonide (KENALOG) 10 MG/ML injection 10 mg  . triamcinolone acetonide (KENALOG) 10 MG/ML injection 10 mg    Allergies:  Allergies  Allergen Reactions  . Naltrexone Other (See Comments)    Panic attack     Review of Systems:  A fourteen system review of systems was performed and found to be positive as per HPI.   Objective:   Blood pressure 109/73, pulse 87, height 5\' 4"  (1.626 m), weight 190 lb (86.2 kg), SpO2 97 %. Body mass index is 32.61 kg/m.   General: Well Developed, well nourished, and in no acute distress.  HEENT: Normocephalic, atraumatic  Skin: Warm and dry, cap RF less 2 sec, good turgor Chest:  Normal excursion, shape, no gross abn Respiratory: speaking in full sentences, no conversational dyspnea NeuroM-Sk: Ambulates w/o assistance, moves * 4 Psych: A and O *3, insight good, mood-full

## 2019-08-19 ENCOUNTER — Other Ambulatory Visit: Payer: Self-pay | Admitting: Family Medicine

## 2019-08-19 DIAGNOSIS — Z1231 Encounter for screening mammogram for malignant neoplasm of breast: Secondary | ICD-10-CM

## 2019-08-28 ENCOUNTER — Other Ambulatory Visit: Payer: Self-pay | Admitting: Family Medicine

## 2019-08-28 DIAGNOSIS — E039 Hypothyroidism, unspecified: Secondary | ICD-10-CM

## 2019-09-03 ENCOUNTER — Inpatient Hospital Stay: Admission: RE | Admit: 2019-09-03 | Payer: BC Managed Care – PPO | Source: Ambulatory Visit

## 2019-09-04 DIAGNOSIS — M533 Sacrococcygeal disorders, not elsewhere classified: Secondary | ICD-10-CM | POA: Diagnosis not present

## 2019-09-18 ENCOUNTER — Telehealth: Payer: Self-pay | Admitting: Family Medicine

## 2019-09-18 NOTE — Telephone Encounter (Signed)
Pt called states she stepped in a hole last night & injured her Rt foot (very painful ) & wanted provider to X-ray it to rule out broken.(advised unable to perform xray at office & suggest she go to UC or Orthopedic provider--Patient states she will go to her Podiatrist instead .  --Patient has an appt @ [facility] on Wed for other issue and wanted to make sure Dr. Raliegh Scarlet knew they will be sending OV notes afterward to her for review.  --glh

## 2019-09-19 ENCOUNTER — Other Ambulatory Visit: Payer: Self-pay

## 2019-09-19 ENCOUNTER — Encounter: Payer: Self-pay | Admitting: Sports Medicine

## 2019-09-19 ENCOUNTER — Ambulatory Visit (INDEPENDENT_AMBULATORY_CARE_PROVIDER_SITE_OTHER): Payer: Medicare HMO | Admitting: Sports Medicine

## 2019-09-19 ENCOUNTER — Other Ambulatory Visit: Payer: Self-pay | Admitting: Sports Medicine

## 2019-09-19 ENCOUNTER — Telehealth: Payer: Self-pay | Admitting: *Deleted

## 2019-09-19 ENCOUNTER — Ambulatory Visit (INDEPENDENT_AMBULATORY_CARE_PROVIDER_SITE_OTHER): Payer: Medicare HMO

## 2019-09-19 DIAGNOSIS — M25571 Pain in right ankle and joints of right foot: Secondary | ICD-10-CM

## 2019-09-19 DIAGNOSIS — S86011A Strain of right Achilles tendon, initial encounter: Secondary | ICD-10-CM

## 2019-09-19 DIAGNOSIS — M79671 Pain in right foot: Secondary | ICD-10-CM

## 2019-09-19 DIAGNOSIS — M25471 Effusion, right ankle: Secondary | ICD-10-CM

## 2019-09-19 MED ORDER — OXYCODONE-ACETAMINOPHEN 10-325 MG PO TABS: 1.0000 | ORAL_TABLET | Freq: Three times a day (TID) | ORAL | 0 refills | Status: AC | PRN

## 2019-09-19 NOTE — Telephone Encounter (Signed)
Pt states she would like to have [place] at [facility]. Orders faxed to [facility].

## 2019-09-19 NOTE — Telephone Encounter (Signed)
Dr. Stover please advice 

## 2019-09-19 NOTE — Progress Notes (Signed)
Subjective: Ariel King is a 48 y.o. female patient who presents to office for evaluation of Right heel pain patient reports 2 days ago she stepped in a hole while walking her dog.  Patient reports that since she has had excruciating pain at the back of the heel and her foot and ankle has swollen.  Patient reports that she has been trying to rest ice elevate and take her Celebrex but now she is out of it so I switch over to ibuprofen as well as using heat and ice.  Patient reports that is difficult to walk due to the pain.  Review of Systems  All other systems reviewed and are negative.   Patient Active Problem List   Diagnosis Date Noted  . Environmental and seasonal allergies 04/22/2019  . Reactive airway disease with wheezing 05/21/2018  . Tobacco consumption 05/21/2018  . Tobacco abuse counseling 05/21/2018  . Achilles tendinitis of both lower extremities 05/21/2018  . Chronic right-sided low back pain with right-sided sciatica 05/22/2017  . History of chlamydia 03/22/2017  . Muscle strain 03/22/2017  . Lumbar spine strain, initial encounter 03/22/2017  . Severe episode of recurrent major depressive disorder, without psychotic features ([place]) 03/05/2017  . Encounter for wellness examination 10/25/2016  . Panic attacks 06/09/2016  . Status post hysterectomy, left ovaries, took cervix. 06/09/2016  . Vitamin D deficiency 06/09/2016  . Adult hypothyroidism 12/19/2015  . Obesity (BMI 30-39.9) 11/03/2013  . Chronic pain syndrome 12/25/2012  . h/o Blood glucose elevated 12/25/2012  . h/o Hypertriglyceridemia 12/25/2012  . Other insomnia 12/25/2012  . Chronic fatigue, unspecified 12/25/2012  . Migraine without aura and without status migrainosus, not intractable 12/25/2012  . Anxiety 03/14/2012  . Migraines 03/14/2012  . Depression 03/14/2012  . Chronic fatigue disorder 03/14/2012    Current Outpatient Medications on File Prior to Visit  Medication Sig Dispense Refill  .  albuterol (VENTOLIN HFA) 108 (90 Base) MCG/ACT inhaler INHALE 1 TO 2 PUFFS INTO THE LUNGS EVERY 4 (FOUR) HOURS AS NEEDED FOR WHEEZING OR SHORTNESS OF BREATH. 18 g 0  . ALPRAZolam (XANAX) 0.5 MG tablet Take 1-2 tablets by mouth every 6 (six) hours as needed.    Marland Kitchen amphetamine-dextroamphetamine (ADDERALL) 20 MG tablet Take 1 tablet by mouth 3 (three) times daily.    . B Complex Vitamins (VITAMIN-B COMPLEX) TABS Take 1 tablet by mouth daily.    . diclofenac sodium (VOLTAREN) 1 % GEL Apply 4 g topically 4 (four) times daily. 100 g 2  . fluticasone (FLONASE) 50 MCG/ACT nasal spray USE 2 SPRAYS IN EACH NOSTRIL EVERY DAY 48 g 1  . levothyroxine (SYNTHROID) 125 MCG tablet TAKE 1 TABLET EVERY DAY 90 tablet 0  . methocarbamol (ROBAXIN) 500 MG tablet TAKE 1 TABLET (500 MG TOTAL) BY MOUTH NIGHTLY AS NEEDED  3  . montelukast (SINGULAIR) 10 MG tablet Take 1 tablet (10 mg total) by mouth at bedtime. 90 tablet 1  . pregabalin (LYRICA) 75 MG capsule Take 1 capsule (75 mg total) by mouth 2 (two) times daily. 60 capsule 1  . QUEtiapine (SEROQUEL) 100 MG tablet Take 100 mg by mouth as needed.    . traZODone (DESYREL) 100 MG tablet Take 3 tablets by mouth at bedtime.     Marland Kitchen venlafaxine XR (EFFEXOR-XR) 75 MG 24 hr capsule Take 3 capsules by mouth every morning.    . Vitamin D, Ergocalciferol, (DRISDOL) 1.25 MG (50000 UT) CAPS capsule Take 1 capsule (50,000 Units total) by mouth every 7 (seven) days.  12 capsule 3   Current Facility-Administered Medications on File Prior to Visit  Medication Dose Route Frequency Provider Last Rate Last Dose  . triamcinolone acetonide (KENALOG) 10 MG/ML injection 10 mg  10 mg Other Once Landis Martins, DPM      . triamcinolone acetonide (KENALOG) 10 MG/ML injection 10 mg  10 mg Other Once Landis Martins, DPM        Allergies  Allergen Reactions  . Naltrexone Other (See Comments)    Panic attack    Objective:  General: Alert and oriented x3 in no acute distress  Dermatology: No  open lesions bilateral lower extremities, no webspace macerations, no ecchymosis bilateral, all nails x 10 are well manicured.  Vascular: Dorsalis Pedis and Posterior Tibial pedal pulses 1/4, Capillary Fill Time 3 seconds, + pedal hair growth bilateral, Focal edema to right lower extremity, Temperature gradient within normal limits.  Neurology: Johney Maine sensation intact via light touch bilateral.   Musculoskeletal: Mild tenderness with palpation at insertion of the Achilles on right with partial gap concerning for tear, Thompson test unable to discern due to pain on right.   Xrays  Right Foot    Impression: Normal osseous mineralization. Joint spaces preserved. No fracture/dislocation/boney destruction. Calcaneal spur present. Kager's triangle decreased with possible obliteration. No soft tissue abnormalities or radiopaque foreign bodies.   Assessment and Plan: Problem List Items Addressed This Visit    None    Visit Diagnoses    Achilles tendon tear, right, initial encounter    -  Primary   Relevant Orders   For home use only DME Other see comment   Acute right ankle pain       Ankle swelling, right         -Complete examination performed -Xrays reviewed -Discussed treatment options for tear -Rx [place] or MRI to evaluate for tear  -Rx Percocet and Celebrex for pain an inflammation -Applied unna boot to right and advised patient to keep in place for 5-7 days and to re-apply ace wrap for edema control -Rx Knee scooter and Advised NWB until we can get results from [place] or MRI -Patient to return to office 1 week or sooner if condition worsens.  Landis Martins, DPM

## 2019-09-19 NOTE — Telephone Encounter (Signed)
-----   Message from Landis Martins, [place] sent at 09/19/2019  9:53 AM EST ----- Regarding: Urgent Ultrasound or MRI Evaluate for achilles tear on right Patient fell in a hole 2 day ago

## 2019-09-25 ENCOUNTER — Ambulatory Visit
Admission: RE | Admit: 2019-09-25 | Discharge: 2019-09-25 | Disposition: A | Discharge status: Home / Self Care | Payer: BC Managed Care – PPO | Source: Ambulatory Visit | Attending: Sports Medicine | Admitting: Sports Medicine

## 2019-09-25 DIAGNOSIS — S86011A Strain of right Achilles tendon, initial encounter: Secondary | ICD-10-CM | POA: Diagnosis not present

## 2019-09-26 ENCOUNTER — Ambulatory Visit (INDEPENDENT_AMBULATORY_CARE_PROVIDER_SITE_OTHER): Payer: Medicare HMO | Admitting: Sports Medicine

## 2019-09-26 ENCOUNTER — Other Ambulatory Visit: Payer: Self-pay

## 2019-09-26 ENCOUNTER — Encounter: Payer: Self-pay | Admitting: Sports Medicine

## 2019-09-26 DIAGNOSIS — S86011D Strain of right Achilles tendon, subsequent encounter: Secondary | ICD-10-CM | POA: Diagnosis not present

## 2019-09-26 DIAGNOSIS — M25571 Pain in right ankle and joints of right foot: Secondary | ICD-10-CM | POA: Diagnosis not present

## 2019-09-26 DIAGNOSIS — S86011A Strain of right Achilles tendon, initial encounter: Secondary | ICD-10-CM

## 2019-09-26 DIAGNOSIS — M25471 Effusion, right ankle: Secondary | ICD-10-CM | POA: Diagnosis not present

## 2019-09-26 MED ORDER — OXYCODONE-ACETAMINOPHEN 10-325 MG PO TABS: 1.0000 | ORAL_TABLET | Freq: Four times a day (QID) | ORAL | 0 refills | Status: AC | PRN

## 2019-09-26 NOTE — Progress Notes (Signed)
Subjective: Ariel King is a 48 y.o. female patient who returns to office for follow up evaluation of right heel pain. Patient reports that pain about the same 8/10 Percocet. Reports that swelling is better and has been icing and borrowed a knee scooter from her Mother in [place] because she never got a call from [facility]. Patient reports that she was able to keep her unna boot on for 5 days before she had to remove it for her Ultrasound. Patient denies any other pedal complaints at this time.   Patient Active Problem List   Diagnosis Date Noted  . Environmental and seasonal allergies 04/22/2019  . Reactive airway disease with wheezing 05/21/2018  . Tobacco consumption 05/21/2018  . Tobacco abuse counseling 05/21/2018  . Achilles tendinitis of both lower extremities 05/21/2018  . Chronic right-sided low back pain with right-sided sciatica 05/22/2017  . History of chlamydia 03/22/2017  . Muscle strain 03/22/2017  . Lumbar spine strain, initial encounter 03/22/2017  . Severe episode of recurrent major depressive disorder, without psychotic features ([place]) 03/05/2017  . Encounter for wellness examination 10/25/2016  . Panic attacks 06/09/2016  . Status post hysterectomy, left ovaries, took cervix. 06/09/2016  . Vitamin D deficiency 06/09/2016  . Adult hypothyroidism 12/19/2015  . Obesity (BMI 30-39.9) 11/03/2013  . Chronic pain syndrome 12/25/2012  . h/o Blood glucose elevated 12/25/2012  . h/o Hypertriglyceridemia 12/25/2012  . Other insomnia 12/25/2012  . Chronic fatigue, unspecified 12/25/2012  . Migraine without aura and without status migrainosus, not intractable 12/25/2012  . Anxiety 03/14/2012  . Migraines 03/14/2012  . Depression 03/14/2012  . Chronic fatigue disorder 03/14/2012    Current Outpatient Medications on File Prior to Visit  Medication Sig Dispense Refill  . albuterol (VENTOLIN HFA) 108 (90 Base) MCG/ACT inhaler INHALE 1 TO 2 PUFFS INTO THE LUNGS EVERY 4  (FOUR) HOURS AS NEEDED FOR WHEEZING OR SHORTNESS OF BREATH. 18 g 0  . ALPRAZolam (XANAX) 0.5 MG tablet Take 1-2 tablets by mouth every 6 (six) hours as needed.    Marland Kitchen amphetamine-dextroamphetamine (ADDERALL) 20 MG tablet Take 1 tablet by mouth 3 (three) times daily.    . B Complex Vitamins (VITAMIN-B COMPLEX) TABS Take 1 tablet by mouth daily.    . celecoxib (CELEBREX) 200 MG capsule TAKE 1 CAPSULE TWICE DAILY 180 capsule 1  . diclofenac sodium (VOLTAREN) 1 % GEL Apply 4 g topically 4 (four) times daily. 100 g 2  . fluticasone (FLONASE) 50 MCG/ACT nasal spray USE 2 SPRAYS IN EACH NOSTRIL EVERY DAY 48 g 1  . levothyroxine (SYNTHROID) 125 MCG tablet TAKE 1 TABLET EVERY DAY 90 tablet 0  . methocarbamol (ROBAXIN) 500 MG tablet TAKE 1 TABLET (500 MG TOTAL) BY MOUTH NIGHTLY AS NEEDED  3  . montelukast (SINGULAIR) 10 MG tablet Take 1 tablet (10 mg total) by mouth at bedtime. 90 tablet 1  . pregabalin (LYRICA) 75 MG capsule Take 1 capsule (75 mg total) by mouth 2 (two) times daily. 60 capsule 1  . QUEtiapine (SEROQUEL) 100 MG tablet Take 100 mg by mouth as needed.    . traZODone (DESYREL) 100 MG tablet Take 3 tablets by mouth at bedtime.     Marland Kitchen venlafaxine XR (EFFEXOR-XR) 75 MG 24 hr capsule Take 3 capsules by mouth every morning.    . Vitamin D, Ergocalciferol, (DRISDOL) 1.25 MG (50000 UT) CAPS capsule Take 1 capsule (50,000 Units total) by mouth every 7 (seven) days. 12 capsule 3   Current Facility-Administered Medications on  File Prior to Visit  Medication Dose Route Frequency Provider Last Rate Last Dose  . triamcinolone acetonide (KENALOG) 10 MG/ML injection 10 mg  10 mg Other Once Landis Martins, DPM      . triamcinolone acetonide (KENALOG) 10 MG/ML injection 10 mg  10 mg Other Once Landis Martins, DPM        Allergies  Allergen Reactions  . Naltrexone Other (See Comments)    Panic attack    Objective:  General: Alert and oriented x3 in no acute distress  Dermatology: No open lesions  bilateral lower extremities, no webspace macerations, no ecchymosis bilateral, all nails x 10 are well manicured.  Vascular: Dorsalis Pedis and Posterior Tibial pedal pulses 1/4, Capillary Fill Time 3 seconds, + pedal hair growth bilateral, Focal edema to right lower extremity, Temperature gradient within normal limits.  Neurology: Johney Maine sensation intact via light touch bilateral.   Musculoskeletal: Mild tenderness with palpation at insertion of the Achilles on right with palpable dell, Thompson test unable to discern due to pain on right.   Ultrasound confirmative of a partial tear of the right Achilles at the medial aspect  Assessment and Plan: Problem List Items Addressed This Visit    None    Visit Diagnoses    Achilles tendon tear, right, initial encounter    -  Primary   Achilles tendon tear, right, subsequent encounter       Relevant Medications   oxyCODONE-acetaminophen (PERCOCET) 10-325 MG tablet   Acute right ankle pain       Ankle swelling, right         -Complete examination performed -US reviewed - Unna boot applied and advised patient to keep intact for 5 to 7 days after removal may use compression sleeve -Patient to remain nonweightbearing to prevent against worsening of tear and to allow for conservative healing -Continue with rest ice elevation refill Percocet to take for more severe pain -Patient to return to office 1-2 weeks or sooner if condition worsens.  Landis Martins, DPM

## 2019-09-29 ENCOUNTER — Telehealth: Payer: Self-pay | Admitting: Family Medicine

## 2019-09-29 ENCOUNTER — Other Ambulatory Visit: Payer: Self-pay | Admitting: Family Medicine

## 2019-09-29 DIAGNOSIS — J3089 Other allergic rhinitis: Secondary | ICD-10-CM

## 2019-09-29 DIAGNOSIS — J4520 Mild intermittent asthma, uncomplicated: Secondary | ICD-10-CM

## 2019-09-29 NOTE — Telephone Encounter (Signed)
Patient called left msg on office voicemail for Gabapentin medication ( do not see that Dr. Jenetta Downer has ever prescribed this medication for patient)--Pt request 3  Month supply.  Forwarding her request to med asst for review w/provider for Rx order to :  [place], [place] [phone] (Phone) [phone] (Fax)   --glh

## 2019-09-30 ENCOUNTER — Telehealth: Payer: Self-pay | Admitting: Family Medicine

## 2019-09-30 NOTE — Telephone Encounter (Signed)
Advised pt that she was advised by Dr. Raliegh Scarlet at her last OV on 08/13/2019 to discontinue gabapentin and continue Lyrica.  Pt states that the Lyrica made her "feel funny" and wishes to change back to the gabapentin.  Advised pt that she would need a telehealth visit with Dr. Raliegh Scarlet to discuss medication changes.  Pt expressed understanding and is agreeable.  Charyl Bigger, CMA

## 2019-09-30 NOTE — Telephone Encounter (Signed)
Patient called back begging for at least a small amount of Gabapentin-- Advise her provider says must have TELEHEALTH or DOXY for Rx refill--  ----Pt very willing to make appt for any available date provider has, unfortunately provider has NO AVAILABLE appt until 10/16/19 ( Provider is out of office frm 11/23- 11/30)  ---Patient will be going out of town frm (11/25-12/5)  --spke w/Dr. Jenetta Downer regarding pt's need for Rx--she allow only a 30dy refill of Gabapentin & states pt can do a DOXY/ OR TELEHEALTH APPT on phone while out of town 10/16/19--- advising pt of requirements. --forwarding refill approval to med asst.  --glh

## 2019-10-01 ENCOUNTER — Other Ambulatory Visit: Payer: Self-pay

## 2019-10-01 MED ORDER — GABAPENTIN 300 MG PO CAPS: ORAL_CAPSULE | ORAL | 0 refills | Status: DC

## 2019-10-01 NOTE — Telephone Encounter (Signed)
Per Dr. Raliegh Scarlet ok to send a 30 day supply of pt's previous dose.  Pt must have telemedicine visit prior to any further refills.  Please call pt and make her aware and ensure that she has an appt with Dr. Raliegh Scarlet for f/u.  Charyl Bigger, CMA

## 2019-10-06 ENCOUNTER — Telehealth: Payer: Self-pay | Admitting: *Deleted

## 2019-10-06 ENCOUNTER — Other Ambulatory Visit: Payer: Self-pay | Admitting: Sports Medicine

## 2019-10-06 DIAGNOSIS — S86011D Strain of right Achilles tendon, subsequent encounter: Secondary | ICD-10-CM

## 2019-10-06 MED ORDER — OXYCODONE-ACETAMINOPHEN 10-325 MG PO TABS: 1.0000 | ORAL_TABLET | Freq: Three times a day (TID) | ORAL | 0 refills | Status: DC | PRN

## 2019-10-06 NOTE — Progress Notes (Signed)
Refilled pain medication for achilles tear -Dr. Cannon Kettle

## 2019-10-06 NOTE — Telephone Encounter (Signed)
Patient called requesting a refill of her pain medication.  Please advise 

## 2019-10-06 NOTE — Telephone Encounter (Signed)
Patient notified

## 2019-10-06 NOTE — Telephone Encounter (Signed)
Sent a refill of percocet to her pharmacy for pain related to her achilles tear

## 2019-10-08 ENCOUNTER — Other Ambulatory Visit: Payer: Self-pay

## 2019-10-08 ENCOUNTER — Encounter: Payer: Self-pay | Admitting: Sports Medicine

## 2019-10-08 ENCOUNTER — Ambulatory Visit (INDEPENDENT_AMBULATORY_CARE_PROVIDER_SITE_OTHER): Payer: Medicare HMO | Admitting: Sports Medicine

## 2019-10-08 DIAGNOSIS — S86011D Strain of right Achilles tendon, subsequent encounter: Secondary | ICD-10-CM | POA: Diagnosis not present

## 2019-10-08 DIAGNOSIS — M25471 Effusion, right ankle: Secondary | ICD-10-CM

## 2019-10-08 DIAGNOSIS — M25571 Pain in right ankle and joints of right foot: Secondary | ICD-10-CM

## 2019-10-08 MED ORDER — OXYCODONE-ACETAMINOPHEN 10-325 MG PO TABS: 1.0000 | ORAL_TABLET | Freq: Four times a day (QID) | ORAL | 0 refills | Status: AC | PRN

## 2019-10-08 NOTE — Progress Notes (Signed)
Subjective: Ariel King is a 48 y.o. female patient who returns to office for follow up evaluation of right heel pain. Patient reports that pain about the same now 7/10 Percocet. Reports that swelling is better with unna boot. Patient denies any other pedal complaints at this time.   Patient Active Problem List   Diagnosis Date Noted  . Environmental and seasonal allergies 04/22/2019  . Reactive airway disease with wheezing 05/21/2018  . Tobacco consumption 05/21/2018  . Tobacco abuse counseling 05/21/2018  . Achilles tendinitis of both lower extremities 05/21/2018  . Chronic right-sided low back pain with right-sided sciatica 05/22/2017  . History of chlamydia 03/22/2017  . Muscle strain 03/22/2017  . Lumbar spine strain, initial encounter 03/22/2017  . Severe episode of recurrent major depressive disorder, without psychotic features ([place]) 03/05/2017  . Encounter for wellness examination 10/25/2016  . Panic attacks 06/09/2016  . Status post hysterectomy, left ovaries, took cervix. 06/09/2016  . Vitamin D deficiency 06/09/2016  . Adult hypothyroidism 12/19/2015  . Obesity (BMI 30-39.9) 11/03/2013  . Chronic pain syndrome 12/25/2012  . h/o Blood glucose elevated 12/25/2012  . h/o Hypertriglyceridemia 12/25/2012  . Other insomnia 12/25/2012  . Chronic fatigue, unspecified 12/25/2012  . Migraine without aura and without status migrainosus, not intractable 12/25/2012  . Anxiety 03/14/2012  . Migraines 03/14/2012  . Depression 03/14/2012  . Chronic fatigue disorder 03/14/2012    Current Outpatient Medications on File Prior to Visit  Medication Sig Dispense Refill  . albuterol (VENTOLIN HFA) 108 (90 Base) MCG/ACT inhaler INHALE 1 TO 2 PUFFS INTO THE LUNGS EVERY 4 (FOUR) HOURS AS NEEDED FOR WHEEZING OR SHORTNESS OF BREATH. 18 g 0  . ALPRAZolam (XANAX) 0.5 MG tablet Take 1-2 tablets by mouth every 6 (six) hours as needed.    Marland Kitchen amphetamine-dextroamphetamine (ADDERALL) 20 MG tablet  Take 1 tablet by mouth 3 (three) times daily.    . B Complex Vitamins (VITAMIN-B COMPLEX) TABS Take 1 tablet by mouth daily.    . celecoxib (CELEBREX) 200 MG capsule TAKE 1 CAPSULE TWICE DAILY 180 capsule 1  . diclofenac sodium (VOLTAREN) 1 % GEL Apply 4 g topically 4 (four) times daily. 100 g 2  . fluticasone (FLONASE) 50 MCG/ACT nasal spray USE 2 SPRAYS IN EACH NOSTRIL EVERY DAY 48 g 1  . gabapentin (NEURONTIN) 300 MG capsule TAKE 1 CAPSULE BY MOUTH THREE TIMES A DAY 90 capsule 0  . levothyroxine (SYNTHROID) 125 MCG tablet TAKE 1 TABLET EVERY DAY 90 tablet 0  . methocarbamol (ROBAXIN) 500 MG tablet TAKE 1 TABLET (500 MG TOTAL) BY MOUTH NIGHTLY AS NEEDED  3  . montelukast (SINGULAIR) 10 MG tablet Take 1 tablet (10 mg total) by mouth at bedtime. 90 tablet 1  . pregabalin (LYRICA) 75 MG capsule Take 1 capsule (75 mg total) by mouth 2 (two) times daily. 60 capsule 1  . QUEtiapine (SEROQUEL) 100 MG tablet Take 100 mg by mouth as needed.    . traZODone (DESYREL) 100 MG tablet Take 3 tablets by mouth at bedtime.     Marland Kitchen venlafaxine XR (EFFEXOR-XR) 75 MG 24 hr capsule Take 3 capsules by mouth every morning.    . Vitamin D, Ergocalciferol, (DRISDOL) 1.25 MG (50000 UT) CAPS capsule Take 1 capsule (50,000 Units total) by mouth every 7 (seven) days. 12 capsule 3   Current Facility-Administered Medications on File Prior to Visit  Medication Dose Route Frequency Provider Last Rate Last Dose  . triamcinolone acetonide (KENALOG) 10 MG/ML injection 10 mg  10 mg Other Once Landis Martins, DPM      . triamcinolone acetonide (KENALOG) 10 MG/ML injection 10 mg  10 mg Other Once Landis Martins, DPM        Allergies  Allergen Reactions  . Naltrexone Other (See Comments)    Panic attack    Objective:  General: Alert and oriented x3 in no acute distress  Dermatology: No open lesions bilateral lower extremities, no webspace macerations, no ecchymosis bilateral, all nails x 10 are well manicured.  Vascular:  Dorsalis Pedis and Posterior Tibial pedal pulses 1/4, Capillary Fill Time 3 seconds, + pedal hair growth bilateral, Focal edema to right lower extremity, Temperature gradient within normal limits.  Neurology: Johney Maine sensation intact via light touch bilateral.   Musculoskeletal: Mild tenderness with palpation at insertion of the Achilles on right with palpable dell, Thompson test unable to discern due to pain on right.   Assessment and Plan: Problem List Items Addressed This Visit    None    Visit Diagnoses    Achilles tendon tear, right, subsequent encounter    -  Primary   Acute right ankle pain       Ankle swelling, right         -Complete examination performed -Chart reviewed - Unna boot applied and advised patient to keep intact for 5 to 7 days after removal may use compression sleeve as provided -Advised ASA if she plans to travel for the holiday -Patient to remain nonweightbearing to prevent against worsening of tear and to allow for conservative healing, continue with knee scooter Rx given since patient purchased this to submit to her insurance company -Continue with rest ice elevation - Refilled Percocet to take for more severe pain -Patient to return to office 2 weeks or sooner if condition worsens.  Landis Martins, DPM

## 2019-10-15 ENCOUNTER — Encounter: Payer: Self-pay | Admitting: Family Medicine

## 2019-10-15 ENCOUNTER — Other Ambulatory Visit: Payer: Self-pay

## 2019-10-15 ENCOUNTER — Ambulatory Visit (INDEPENDENT_AMBULATORY_CARE_PROVIDER_SITE_OTHER): Payer: Medicare HMO | Admitting: Family Medicine

## 2019-10-15 DIAGNOSIS — F4329 Adjustment disorder with other symptoms: Secondary | ICD-10-CM

## 2019-10-15 DIAGNOSIS — F332 Major depressive disorder, recurrent severe without psychotic features: Secondary | ICD-10-CM | POA: Diagnosis not present

## 2019-10-15 DIAGNOSIS — M5441 Lumbago with sciatica, right side: Secondary | ICD-10-CM

## 2019-10-15 DIAGNOSIS — G8929 Other chronic pain: Secondary | ICD-10-CM | POA: Diagnosis not present

## 2019-10-15 DIAGNOSIS — F419 Anxiety disorder, unspecified: Secondary | ICD-10-CM

## 2019-10-15 DIAGNOSIS — G4709 Other insomnia: Secondary | ICD-10-CM | POA: Diagnosis not present

## 2019-10-15 DIAGNOSIS — F430 Acute stress reaction: Secondary | ICD-10-CM | POA: Diagnosis not present

## 2019-10-15 MED ORDER — GABAPENTIN 300 MG PO CAPS: ORAL_CAPSULE | ORAL | 5 refills | Status: DC

## 2019-10-15 NOTE — Progress Notes (Signed)
Virtual / live video office visit note for [employer], D.O- Primary Care Physician at [facility]   I connected with current patient today and beyond visually recognizing the correct individual, I verified that I am speaking with the correct person using two identifiers.  . Location of the patient: Home . Location of the provider: Office Only the patient (+/- their family members at pt's discretion) and myself were participating in the encounter    - This visit type was conducted due to national recommendations for restrictions regarding the COVID-19 Pandemic (e.g. social distancing) in an effort to limit this patient's exposure and mitigate transmission in our community.  This format is felt to be most appropriate for this patient at this time.   - The patient did have access to video technology today  - No physical exam could be performed with this format, beyond that communicated to [place] by the patient/ family members as noted.   - Additionally my office staff/ schedulers discussed with the patient that there may be a monetary charge related to this service, depending on patient's medical insurance.   The patient expressed understanding, and agreed to proceed.      History of Present Illness: I, Toni Amend, am serving as scribe for Dr. Mellody Dance.  - Grief due to Death of Loved One Notes she's not doing too well.  Her partner Audry Pili recently died.  Patient states "he died in my arms."  Confirms that he passed in the middle of having intercourse, due to having a heart attack.    Says she's never known him to do cocaine in the two years she has known him, however his toxicology report showed an overdose of cocaine.  Notes the experience as a whole was very traumatic for her to witness.  Patient discusses the incident at length.  Indicates she is blaming herself for not responding quickly enough.  She has not called Dr. Toy Care yet, but has spoken to her preacher and a  person at her church that does counseling.  Notes she will call Dr. Toy Care for further assistance if desired.  Notes last night was the first night she managed to sleep, "because you know, you go to sleep and then you wake up and have to do it all over again."  Confirms she has the support and love of other family members during this time.  She recently moved to her sister's for a change of scenery after Ricky's passing.  Notes her son and daughter and two sisters have been her "rocks."  Denies suicidal ideations or thoughts of harming herself.  "I know Audry Pili wouldn't want me to do anything like that."  Says "I couldn't ever kill my own self, and I don't feel like that, and I feel like I never could do that.  I don't have the nerve."  - Lifestyle Patient is not currently working.  Notes she tore her Achilles, "but not bad enough for surgery."  She is currently using a knee scooter.  "They will not let me put weight on my foot yet."  She had an ultrasound performed to evaluate the area and goes back for follow-up on Wednesday.  States she has been trying to eat more healthfully lately and attempting to lose weight.  - Neuropathic Pain Continues taking 300 mg Gabapentin three times per day.  Notes she tried the Pregabalin but it did not work as intended.   Depression screen [facility] 2/9 10/15/2019 08/13/2019 04/24/2019  04/22/2019 09/18/2018  Decreased Interest 1 0 1 0 1  Down, Depressed, Hopeless 3 1 1  0 1  PHQ - 2 Score 4 1 2  0 2  Altered sleeping 1 1 1 1 1   Tired, decreased energy 0 0 0 0 1  Change in appetite 1 0 0 0 1  Feeling bad or failure about yourself  0 1 1 0 1  Trouble concentrating 1 1 1  0 1  Moving slowly or fidgety/restless 1 0 0 0 1  Suicidal thoughts 0 0 0 0 0  PHQ-9 Score 8 4 5 1 8   Difficult doing work/chores Extremely dIfficult Somewhat difficult Somewhat difficult Not difficult at all Somewhat difficult  Some recent data might be hidden    GAD 7 : Generalized Anxiety Score  10/15/2019 04/22/2019 03/22/2017  Nervous, Anxious, on Edge 3 1 3   Control/stop worrying 3 1 3   Worry too much - different things 3 0 3  Trouble relaxing 3 2 3   Restless 1 3 3   Easily annoyed or irritable 0 0 3  Afraid - awful might happen 3 0 1  Total GAD 7 Score 16 7 19   Anxiety Difficulty Extremely difficult Somewhat difficult Very difficult     Impression and Recommendations:    1. Severe episode of recurrent major depressive disorder, without psychotic features ([place])   2. Emotional crisis as acute reaction to exceptional (gross) stress   3. Other insomnia   4. Post-trauma response   5. Chronic right-sided low back pain with right-sided sciatica   6. Anxiety      Emotional Crisis/Acute Reaction to Exceptional Stress; Post-trauma response, MDD, Anxiety - Lengthy conversation held with patient today regarding recent death of loved one. - Education provided and all questions answered.  - Continue prescription management as prescribed.  See med list. - Dr. Toy Care of psychiatry will continue to manage patient's Rx treatment.  - Discussed importance of beginning therapy/grief counseling to help cope with traumatic event.  - During this period of acute distress, told patient to reach out to psychiatrist Dr. Toy Care so that additional treatment and management can be provided PRN.  Patient agrees to call today.  - Patient knows to call and ask for affordable counseling options. - Encouraged patient to look for group grief counseling online and other support groups.  - In addition to prescription intervention and counseling, reviewed the "spokes of the wheel" of mood and health management.  Stressed the importance of ongoing prudent habits, including regular exercise, appropriate sleep hygiene, healthful dietary habits, and prayer/meditation to relax.  - Patient denies suicidal ideations. - Patient knows to call or dial 911 before she thinks about harming herself.  - Patient will call  clinic or psychiatry with any questions or concerns.  Other Insomnia - Stable at this time. - Continue treatment plan as established. - Dr. Toy Care will continue to manage patient's treatment plan. - Will continue to monitor alongside psychiatry.  Chronic right-sided low back pain with right-sided sciatica - Stable on current management, gabapentin. - Continue treatment plan as established. - Will continue to monitor.  Vitamin D Deficiency - Continue once weekly Vitamin D. - Will continue to monitor and re-check as recommended.  Lifestyle & Preventative Health Maintenance - Advised patient to continue working toward exercising to improve overall mental, physical, and emotional health.    - Encouraged patient to engage in daily physical activity whenever she is able/her Achilles is recovered, especially a formal exercise routine.  Recommended that the patient eventually  strive for at least 150 minutes of moderate cardiovascular activity per week according to guidelines established by the [facility].   - Healthy dietary habits encouraged, including low-carb, and high amounts of lean protein in diet.   - Patient should also consume adequate amounts of water.  - Health counseling performed.  All questions answered.  Recommendations - Return in 3 weeks per pt request- she would like to follow closely with [place]. - Patient will call Dr. Toy Care and ask about psychiatric counseling. -Try to exercise even while "scooting around" for 15-20 minutes per day. - Time :  Spent approx 55min on live video with direct pt care today and additional time spend in pre-and post visit care and coordination and charting.  - As part of my medical decision making, I reviewed the following data within the [place] History obtained from pt /family, CMA notes reviewed and incorporated if applicable, Labs reviewed, Radiograph/ tests reviewed if applicable and OV notes from prior OV's with me, as well as other  specialists she/he has seen since seeing me last, were all reviewed and used in my medical decision making process today.   - Additionally, discussion had with patient regarding txmnt plan, their biases about that plan etc were used in my medical decision making today.   - The patient agreed with the plan and demonstrated an understanding of the instructions.   No barriers to understanding were identified.    - Red flag symptoms and signs discussed in detail re: emotional despair, although denies SI/HI.  Patient expressed understanding regarding what to do in case of emergency\ urgent symptoms.  The patient was advised to call back or seek an in-person evaluation if the symptoms worsen or if the condition fails to improve as anticipated.   Return for f/up in 3 weeks; pt will call Dr. Toy Care, goal for physical activity 15-20 min per day.    Meds ordered this encounter  Medications  . DISCONTD: gabapentin (NEURONTIN) 300 MG capsule    Sig: TAKE 1 CAPSULE BY MOUTH THREE TIMES A DAY    Dispense:  90 capsule    Refill:  5  . gabapentin (NEURONTIN) 300 MG capsule    Sig: TAKE 1 CAPSULE BY MOUTH THREE TIMES A DAY    Dispense:  90 capsule    Refill:  5    Medications Discontinued During This Encounter  Medication Reason  . pregabalin (LYRICA) 75 MG capsule Error  . B Complex Vitamins (VITAMIN-B COMPLEX) TABS Error  . triamcinolone acetonide (KENALOG) 10 MG/ML injection 10 mg   . triamcinolone acetonide (KENALOG) 10 MG/ML injection 10 mg   . gabapentin (NEURONTIN) 300 MG capsule Reorder  . gabapentin (NEURONTIN) 300 MG capsule      Note:  This note was prepared with assistance of Dragon voice recognition software. Occasional wrong-word or sound-a-like substitutions may have occurred due to the inherent limitations of voice recognition software.  This document serves as a record of services personally performed by Mellody Dance, DO. It was created on her behalf by Toni Amend, a  trained medical scribe. The creation of this record is based on the scribe's personal observations and the provider's statements to them.   This case required medical decision making of at least moderate complexity. The above documentation has been reviewed to be accurate and was completed by Marjory Sneddon, D.O.      Patient Care Team    Relationship Specialty Notifications Start End  Mellody Dance, DO PCP -  General Family Medicine  05/08/16   Chucky May, MD Consulting Physician Psychiatry  08/29/16   Garrel Ridgel, DPM Consulting Physician Podiatry  01/01/18   Center, Central Physician Dermatology  01/01/18    Comment: does yrly skin screening.      Dr.  Silver Huguenin, Hoyle Sauer, DO Consulting Physician Rehabilitation  01/01/18    Comment: spine and back pain  Delsa Bern, MD Consulting Physician Obstetrics and Gynecology  01/02/18   Amalia Greenhouse, MD Referring Physician Endocrinology  10/12/19     -Vitals obtained; medications/ allergies reconciled;  personal medical, social, Sx etc.histories were updated by CMA, reviewed by me and are reflected in chart  Patient Active Problem List   Diagnosis Date Noted  . Severe episode of recurrent major depressive disorder, without psychotic features ([place]) 03/05/2017    Priority: High  . Panic attacks 06/09/2016    Priority: High  . Adult hypothyroidism 12/19/2015    Priority: High  . Other insomnia 12/25/2012    Priority: High  . Anxiety 03/14/2012    Priority: High  . Depression 03/14/2012    Priority: High  . Obesity (BMI 30-39.9) 11/03/2013    Priority: Medium  . h/o Hypertriglyceridemia 12/25/2012    Priority: Medium  . Reactive airway disease with wheezing 05/21/2018    Priority: Low  . Tobacco consumption 05/21/2018    Priority: Low  . Tobacco abuse counseling 05/21/2018    Priority: Low  . Vitamin D deficiency 06/09/2016    Priority: Low  . Emotional crisis as acute reaction to exceptional  (gross) stress 10/15/2019  . Post-trauma response 10/15/2019  . Environmental and seasonal allergies 04/22/2019  . Achilles tendinitis of both lower extremities 05/21/2018  . Chronic right-sided low back pain with right-sided sciatica 05/22/2017  . History of chlamydia 03/22/2017  . Muscle strain 03/22/2017  . Lumbar spine strain, initial encounter 03/22/2017  . Encounter for wellness examination 10/25/2016  . Status post hysterectomy, left ovaries, took cervix. 06/09/2016  . Chronic pain syndrome 12/25/2012  . h/o Blood glucose elevated 12/25/2012  . Chronic fatigue, unspecified 12/25/2012  . Migraine without aura and without status migrainosus, not intractable 12/25/2012  . Migraines 03/14/2012  . Chronic fatigue disorder 03/14/2012     Current Meds  Medication Sig  . albuterol (VENTOLIN HFA) 108 (90 Base) MCG/ACT inhaler INHALE 1 TO 2 PUFFS INTO THE LUNGS EVERY 4 (FOUR) HOURS AS NEEDED FOR WHEEZING OR SHORTNESS OF BREATH.  Marland Kitchen ALPRAZolam (XANAX) 0.5 MG tablet Take 1-2 tablets by mouth every 6 (six) hours as needed.  Marland Kitchen amphetamine-dextroamphetamine (ADDERALL) 20 MG tablet Take 1 tablet by mouth 3 (three) times daily.  . celecoxib (CELEBREX) 200 MG capsule TAKE 1 CAPSULE TWICE DAILY  . diclofenac sodium (VOLTAREN) 1 % GEL Apply 4 g topically 4 (four) times daily.  . fluticasone (FLONASE) 50 MCG/ACT nasal spray USE 2 SPRAYS IN EACH NOSTRIL EVERY DAY  . gabapentin (NEURONTIN) 300 MG capsule TAKE 1 CAPSULE BY MOUTH THREE TIMES A DAY  . levothyroxine (SYNTHROID) 125 MCG tablet TAKE 1 TABLET EVERY DAY  . methocarbamol (ROBAXIN) 500 MG tablet TAKE 1 TABLET (500 MG TOTAL) BY MOUTH NIGHTLY AS NEEDED  . montelukast (SINGULAIR) 10 MG tablet Take 1 tablet (10 mg total) by mouth at bedtime.  . [EXPIRED] oxyCODONE-acetaminophen (PERCOCET) 10-325 MG tablet Take 1 tablet by mouth every 6 (six) hours as needed for up to 7 days for pain.  Marland Kitchen QUEtiapine (SEROQUEL) 100 MG tablet Take 100 mg by mouth  as  needed.  . traZODone (DESYREL) 100 MG tablet Take 3 tablets by mouth at bedtime.   Marland Kitchen venlafaxine XR (EFFEXOR-XR) 75 MG 24 hr capsule Take 3 capsules by mouth every morning.  . Vitamin D, Ergocalciferol, (DRISDOL) 1.25 MG (50000 UT) CAPS capsule Take 1 capsule (50,000 Units total) by mouth every 7 (seven) days.  . [DISCONTINUED] gabapentin (NEURONTIN) 300 MG capsule TAKE 1 CAPSULE BY MOUTH THREE TIMES A DAY  . [DISCONTINUED] gabapentin (NEURONTIN) 300 MG capsule TAKE 1 CAPSULE BY MOUTH THREE TIMES A DAY     Allergies  Allergen Reactions  . Naltrexone Other (See Comments)    Panic attack     ROS:  See above HPI for pertinent positives and negatives   Objective:   There were no vitals taken for this visit.  (if some vitals are omitted, this means that patient was UNABLE to obtain them even though they were asked to get them prior to OV today.  They were asked to call us at their earliest convenience with these once obtained.)  General: A & O * 3; visually in no acute distress; in usual state of health.  Skin: Visible skin appears normal and pt's usual skin color HEENT:  EOMI, head is normocephalic and atraumatic.  Sclera are anicteric. Neck has a good range of motion.  Lips are noncyanotic Chest: normal chest excursion and movement Respiratory: speaking in full sentences, no conversational dyspnea; no use of accessory muscles Psych: insight good, mood- appears full

## 2019-10-22 ENCOUNTER — Other Ambulatory Visit: Payer: Self-pay

## 2019-10-22 ENCOUNTER — Encounter: Payer: Self-pay | Admitting: Sports Medicine

## 2019-10-22 ENCOUNTER — Ambulatory Visit (INDEPENDENT_AMBULATORY_CARE_PROVIDER_SITE_OTHER): Payer: Medicare HMO | Admitting: Sports Medicine

## 2019-10-22 DIAGNOSIS — M25471 Effusion, right ankle: Secondary | ICD-10-CM

## 2019-10-22 DIAGNOSIS — M25571 Pain in right ankle and joints of right foot: Secondary | ICD-10-CM

## 2019-10-22 DIAGNOSIS — S86011D Strain of right Achilles tendon, subsequent encounter: Secondary | ICD-10-CM | POA: Diagnosis not present

## 2019-10-22 MED ORDER — OXYCODONE-ACETAMINOPHEN 10-325 MG PO TABS: 1.0000 | ORAL_TABLET | Freq: Four times a day (QID) | ORAL | 0 refills | Status: AC | PRN

## 2019-10-22 NOTE — Progress Notes (Signed)
Subjective: Ariel King is a 48 y.o. female patient who returns to office for follow up evaluation of right heel pain. Patient reports that pain about the same 7/10 Percocet helps however has been dealing with a tragic loss of her boyfriend the day before Thanksgiving where he overdosed and has not had much time to think about her foot but reports that swelling is better with unna boot. Patient denies any other pedal complaints at this time.   Patient Active Problem List   Diagnosis Date Noted  . Emotional crisis as acute reaction to exceptional (gross) stress 10/15/2019  . Post-trauma response 10/15/2019  . Environmental and seasonal allergies 04/22/2019  . Reactive airway disease with wheezing 05/21/2018  . Tobacco consumption 05/21/2018  . Tobacco abuse counseling 05/21/2018  . Achilles tendinitis of both lower extremities 05/21/2018  . Chronic right-sided low back pain with right-sided sciatica 05/22/2017  . History of chlamydia 03/22/2017  . Muscle strain 03/22/2017  . Lumbar spine strain, initial encounter 03/22/2017  . Severe episode of recurrent major depressive disorder, without psychotic features ([place]) 03/05/2017  . Encounter for wellness examination 10/25/2016  . Panic attacks 06/09/2016  . Status post hysterectomy, left ovaries, took cervix. 06/09/2016  . Vitamin D deficiency 06/09/2016  . Adult hypothyroidism 12/19/2015  . Obesity (BMI 30-39.9) 11/03/2013  . Chronic pain syndrome 12/25/2012  . h/o Blood glucose elevated 12/25/2012  . h/o Hypertriglyceridemia 12/25/2012  . Other insomnia 12/25/2012  . Chronic fatigue, unspecified 12/25/2012  . Migraine without aura and without status migrainosus, not intractable 12/25/2012  . Anxiety 03/14/2012  . Migraines 03/14/2012  . Depression 03/14/2012  . Chronic fatigue disorder 03/14/2012    Current Outpatient Medications on File Prior to Visit  Medication Sig Dispense Refill  . albuterol (VENTOLIN HFA) 108 (90 Base)  MCG/ACT inhaler INHALE 1 TO 2 PUFFS INTO THE LUNGS EVERY 4 (FOUR) HOURS AS NEEDED FOR WHEEZING OR SHORTNESS OF BREATH. 18 g 0  . ALPRAZolam (XANAX) 0.5 MG tablet Take 1-2 tablets by mouth every 6 (six) hours as needed.    Marland Kitchen amphetamine-dextroamphetamine (ADDERALL) 20 MG tablet Take 1 tablet by mouth 3 (three) times daily.    . celecoxib (CELEBREX) 200 MG capsule TAKE 1 CAPSULE TWICE DAILY 180 capsule 1  . diclofenac sodium (VOLTAREN) 1 % GEL Apply 4 g topically 4 (four) times daily. 100 g 2  . fluticasone (FLONASE) 50 MCG/ACT nasal spray USE 2 SPRAYS IN EACH NOSTRIL EVERY DAY 48 g 1  . gabapentin (NEURONTIN) 300 MG capsule TAKE 1 CAPSULE BY MOUTH THREE TIMES A DAY 90 capsule 5  . levothyroxine (SYNTHROID) 125 MCG tablet TAKE 1 TABLET EVERY DAY 90 tablet 0  . methocarbamol (ROBAXIN) 500 MG tablet TAKE 1 TABLET (500 MG TOTAL) BY MOUTH NIGHTLY AS NEEDED  3  . montelukast (SINGULAIR) 10 MG tablet Take 1 tablet (10 mg total) by mouth at bedtime. 90 tablet 1  . QUEtiapine (SEROQUEL) 100 MG tablet Take 100 mg by mouth as needed.    . traZODone (DESYREL) 100 MG tablet Take 3 tablets by mouth at bedtime.     Marland Kitchen venlafaxine XR (EFFEXOR-XR) 75 MG 24 hr capsule Take 3 capsules by mouth every morning.    . Vitamin D, Ergocalciferol, (DRISDOL) 1.25 MG (50000 UT) CAPS capsule Take 1 capsule (50,000 Units total) by mouth every 7 (seven) days. 12 capsule 3   No current facility-administered medications on file prior to visit.     Allergies  Allergen Reactions  . Naltrexone  Other (See Comments)    Panic attack    Objective:  General: Alert and oriented x3 in no acute distress  Dermatology: No open lesions bilateral lower extremities, no webspace macerations, no ecchymosis bilateral, all nails x 10 are well manicured.  Vascular: Dorsalis Pedis and Posterior Tibial pedal pulses 1/4, Capillary Fill Time 3 seconds, + pedal hair growth bilateral, Focal edema to right lower extremity, Temperature gradient within  normal limits.  Neurology: Johney Maine sensation intact via light touch bilateral.   Musculoskeletal: Mild tenderness with palpation at insertion of the Achilles on right with palpable dell, Thompson test unable to discern due to pain on right.   Assessment and Plan: Problem List Items Addressed This Visit    None    Visit Diagnoses    Achilles tendon tear, right, subsequent encounter    -  Primary   Relevant Medications   oxyCODONE-acetaminophen (PERCOCET) 10-325 MG tablet   Acute right ankle pain       Ankle swelling, right         -Complete examination performed - Unna boot applied and advised patient to keep intact for 5 to 7 days after removal may use compression sleeve as provided like previous -Patient to remain nonweightbearing with use of knee scooter -Continue with rest ice elevation - Refilled Percocet to take for more severe pain -Patient to return to office 2 weeks or sooner if condition worsens.  Landis Martins, DPM

## 2019-10-27 DIAGNOSIS — G43709 Chronic migraine without aura, not intractable, without status migrainosus: Secondary | ICD-10-CM | POA: Diagnosis not present

## 2019-10-31 ENCOUNTER — Telehealth: Payer: Self-pay | Admitting: *Deleted

## 2019-10-31 ENCOUNTER — Other Ambulatory Visit: Payer: Self-pay | Admitting: Sports Medicine

## 2019-10-31 MED ORDER — OXYCODONE-ACETAMINOPHEN 10-325 MG PO TABS: 1.0000 | ORAL_TABLET | Freq: Three times a day (TID) | ORAL | 0 refills | Status: DC | PRN

## 2019-10-31 NOTE — Telephone Encounter (Signed)
Dr. Cannon Kettle please advice on RF

## 2019-10-31 NOTE — Telephone Encounter (Signed)
Pt called requesting pain medication

## 2019-10-31 NOTE — Progress Notes (Signed)
Refilled percocet for pain 

## 2019-10-31 NOTE — Telephone Encounter (Signed)
Left message informing pt of Tia Alert [phone], to discuss with Dr. Leeanne Rio assistant her pain medication refill.

## 2019-11-05 ENCOUNTER — Encounter: Payer: Self-pay | Admitting: Family Medicine

## 2019-11-05 ENCOUNTER — Ambulatory Visit (INDEPENDENT_AMBULATORY_CARE_PROVIDER_SITE_OTHER): Payer: Medicare HMO | Admitting: Sports Medicine

## 2019-11-05 ENCOUNTER — Ambulatory Visit (INDEPENDENT_AMBULATORY_CARE_PROVIDER_SITE_OTHER): Payer: Medicare HMO | Admitting: Family Medicine

## 2019-11-05 ENCOUNTER — Encounter: Payer: Self-pay | Admitting: Sports Medicine

## 2019-11-05 ENCOUNTER — Other Ambulatory Visit: Payer: Self-pay

## 2019-11-05 DIAGNOSIS — F332 Major depressive disorder, recurrent severe without psychotic features: Secondary | ICD-10-CM | POA: Diagnosis not present

## 2019-11-05 DIAGNOSIS — F430 Acute stress reaction: Secondary | ICD-10-CM | POA: Diagnosis not present

## 2019-11-05 DIAGNOSIS — Z7189 Other specified counseling: Secondary | ICD-10-CM

## 2019-11-05 DIAGNOSIS — G4709 Other insomnia: Secondary | ICD-10-CM

## 2019-11-05 DIAGNOSIS — M25471 Effusion, right ankle: Secondary | ICD-10-CM

## 2019-11-05 DIAGNOSIS — M25571 Pain in right ankle and joints of right foot: Secondary | ICD-10-CM | POA: Diagnosis not present

## 2019-11-05 DIAGNOSIS — S86011D Strain of right Achilles tendon, subsequent encounter: Secondary | ICD-10-CM | POA: Diagnosis not present

## 2019-11-05 MED ORDER — OXYCODONE-ACETAMINOPHEN 10-325 MG PO TABS: 1.0000 | ORAL_TABLET | Freq: Three times a day (TID) | ORAL | 0 refills | Status: AC | PRN

## 2019-11-05 NOTE — Patient Instructions (Signed)
Behavioral Health/ Counseling Referrals        Ariel King:  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, is a Economist, but does have special interest in eating disorders, body dysmorphia syndrome etc)       [place]- counselor;  [place], [place]  [email or web address] [phone] (text is preferred) [address], [place], [place] [address] She is VERY comfortable with live video visits- which she does regularly    [place]    [facility]: Counselors : Lynden Ang and partner Phil Dopp in Adult & Pediatric Nutrition Counseling Therapy for:  Eating Disorders  Disordered & Emotional Eating  Non-Diet Nutrition Counseling  Family Feeding Coaching  Weight- Neutral Diabetes Management     Anderson Malta, personal counselor in [place], specializing in marriage counseling    Dr. Tomi Bamberger, PHD Dr. Tomi Bamberger, PHD is a counselor in [place], [place].  [address] [place], [place] [address] [place] [phone]   Kristie Cowman, [place]  [phone] [email or web address] ( she does [place] and faith-based coaching and counseling )    [employer]- ( faith-based counseling ) Address: [place]. [place], [place] [address] [phone] [address] for appointments [phone] Fax Hours: Monday - Thursday 8:00am-6:00pm Closed for lunch 12-1Thursday only Friday: Closed all day   Steffanie Rainwater: [phone] or Meg [phone] -counselors in [place] who are faith based   -Also Ms. Marya Fossa - [facility]. Darrick Meigs based counseling.    [facility] Nunzio Cobbs, LCSW, ACSW, M.ED.  -Nunzio Cobbs is a licensed clinical social worker in practice over 35  years and with Dr. Chucky May for the last 10 years.  -She sees adults, adolescents, children & families and couples. -Services are provided for mood and anxiety disorders, marital issues, family or parent/child problems, parenting, co-dependency, gender issues, trauma, grief, and stages of life issues. She also provides critical incident stress debriefing.  -Pamala Hurry accepts many employee assistance programs (EAP), [employer], Pharmacist, hospital.  PHONE  [phone]                FAX [phone]   Rodena Goldmann -[email or web address] [employer]- gen counseling;  PHD   Wilber Oliphant, MSW [address] [place]   [place] Apolonio Schneiders, PhD [address] [facility] [phone]   [place] [facility]- children [address] [place], [place]    [facility]- various counselors [place], [place] [phone] ( one patient saw Gayland Curry and really liked her)    Lanelle Bal Professional Counselor Counseling and [employer] [phone]   [place] abuse [facility] Taylor-Clinical Manager [address] [place] [phone] [place] [phone], [place] Delmer Islam, PhD **   -adult, adolescent, and child Oneida Arenas, PhD Leitha Bleak, LCSW Jillene Bucks, PhD-child, adolescent and adults   [facility] [address], [place] [phone] Merilyn Baba, MS-child, adolescent and adults Lennart Pall, PhD-adolescent and adults   [facility]-grief, terminal illness [place], [place] [address], [place] [facility] [address], [place],  [facility] [phone]   [facility] [address], [place], [place] [phone]   [facility] [address], [place] [phone]   [facility] [address], [place]   [facility] [address], [place] [address], [place]

## 2019-11-05 NOTE — Progress Notes (Signed)
Virtual / live video office visit note for [employer], D.O- Primary Care Physician at [facility]   I connected with current patient today and beyond visually recognizing the correct individual, I verified that I am speaking with the correct person using two identifiers.  . Location of the patient: Home . Location of the provider: Office Only the patient (+/- their family members at pt's discretion) and myself were participating in the encounter    - This visit type was conducted due to national recommendations for restrictions regarding the COVID-19 Pandemic (e.g. social distancing) in an effort to limit this patient's exposure and mitigate transmission in our community.  This format is felt to be most appropriate for this patient at this time.   - The patient did have access to video technology today  - No physical exam could be performed with this format, beyond that communicated to [place] by the patient/ family members as noted.   - Additionally my office staff/ schedulers discussed with the patient that there may be a monetary charge related to this service, depending on patient's medical insurance.   The patient expressed understanding, and agreed to proceed.      History of Present Illness: Anxiety and Depression   - Acute Grief after Death of Loved One Notes she called her psychiatrist, Dr. Toy Care, as advised; "she told me to go with somebody in my network," because otherwise it would be too expensive to see psychiatry for regular counseling.  Patient states "I've got to do something (in terms of therapy) because it isn't getting easier or better."  Says she did some research and is planning to start a support program at a church 4 miles from where she lives.  She was told classes / sessions may start in January.  However, the support group may not begin at all depending on logistics.   Her pastor has been over to visit her twice, and gave her two names as resources. Her  pastor has told her she can call him at any time for assistance, which she has utilized.  Says she thinks she needs both [place] counseling and formal "paid" behavioral health therapy.  Notes she wonders if it's better to let her feelings out or keep them in.  They are having her partner's memorial service on January 9th, and notes "they're gonna have pictures of him up, and I don't know if I can take it."  Says she knows she needs to go for [place].  She recently took all of his clothes and put them in storage, with the help of her best friend.  She also stayed at her sister's house recently for three nights.  Notes between her best friend, her sister, and her parents, they are her "rocks" and she has had plenty of support.  She continues having significant difficulty with her grief and becomes tearful several times during conversation.  States she has outbursts and has cried every day since everything happened.  Her main fear is becoming "a zombie" on short-term treatment.  - Sleep To aid with sleep, psychiatry told the patient that she may increase her trazodone to 3-4 per night.  Patient says "the most I've taken was 3 and I couldn't tell if it made a difference."  She has not tried taking 4 yet.  - Physical Activity & Lifestyle Says "I'm up and down all day long, I cannot sit still."  She has continued being active as possible with  her achilles tendon injury and feels she has lost weight, especially since trying on an outfit that formerly did not fit her.  - Achilles Tendon Tear Says she is so tired of being in a scooter or a wheelchair, but she was told by Podiatry that everything is healing as expected.  She continues following instructions as directed.    Depression screen [facility] 2/9 11/05/2019 10/15/2019 08/13/2019 04/24/2019 04/22/2019  Decreased Interest 1 1 0 1 0  Down, Depressed, Hopeless 3 3 1 1  0  PHQ - 2 Score 4 4 1 2  0  Altered sleeping 3 1 1 1 1   Tired, decreased energy 1 0 0 0 0   Change in appetite 0 1 0 0 0  Feeling bad or failure about yourself  1 0 1 1 0  Trouble concentrating 3 1 1 1  0  Moving slowly or fidgety/restless 3 1 0 0 0  Suicidal thoughts 0 0 0 0 0  PHQ-9 Score 15 8 4 5 1   Difficult doing work/chores Somewhat difficult Extremely dIfficult Somewhat difficult Somewhat difficult Not difficult at all  Some recent data might be hidden    GAD 7 : Generalized Anxiety Score 11/05/2019 10/15/2019 04/22/2019 03/22/2017  Nervous, Anxious, on Edge 3 3 1 3   Control/stop worrying 3 3 1 3   Worry too much - different things 3 3 0 3  Trouble relaxing 3 3 2 3   Restless 3 1 3 3   Easily annoyed or irritable 1 0 0 3  Afraid - awful might happen 2 3 0 1  Total GAD 7 Score 18 16 7 19   Anxiety Difficulty Somewhat difficult Extremely difficult Somewhat difficult Very difficult     Impression and Recommendations:    1. Severe episode of recurrent major depressive disorder, without psychotic features ([place])   2. Emotional crisis as acute reaction to exceptional (gross) stress   3. Other insomnia   4. Counseling on health promotion and disease prevention     Acute Grief after Death of Loved One - Counseling and discussion held today regarding patient's symptoms. - Education provided and all questions answered.  - Dr. Toy Care of psychiatry will continue managing patient's meds.  Told patient to call Dr. Toy Care regarding any specialized concerns, or if she desires to discuss changes to prescriptions / treatment plan.  - Per patient, tolerating trazodone well but it is not helping with sleep as she would like.  - Encouraged patient to take advantage of all therapy/counseling opportunities available, including Christian counseling, local support groups, and [employer] as desired.   - Handout provided today for viewing on MyChart.  - In addition to prescription intervention and therapy/counseling, reviewed the "spokes of the wheel" of mood and health  management.  Stressed the importance of ongoing prudent habits, including regular exercise, appropriate sleep hygiene, healthful dietary habits, and prayer/meditation to relax.  - Will continue to monitor patient alongside Dr. Toy Care of psychiatry.  Achilles Tendon Tear - Told patient to continue to follow up with podiatry as directed. - Patient knows to follow all directions very carefully. - Will continue to monitor alongside podiatry.  Recommendations -Per the patient's preference return in 3 weeks per pt request- she would like to continuefollowing closely with [place].   - Will advise patient regarding obtaining therapy/counseling, alongside follow-up with Dr. Toy Care.   - As part of my medical decision making, I reviewed the following data within the [place] History obtained from pt /family, CMA notes reviewed and  incorporated if applicable, Labs reviewed, Radiograph/ tests reviewed if applicable and OV notes from prior OV's with me, as well as other specialists she/he has seen since seeing me last, were all reviewed and used in my medical decision making process today.   - Additionally, discussion had with patient regarding txmnt plan, their biases about that plan etc were used in my medical decision making today.   - The patient agreed with the plan and demonstrated an understanding of the instructions.   No barriers to understanding were identified.    - Red flag symptoms and signs discussed in detail.  Patient expressed understanding regarding what to do in case of emergency\ urgent symptoms.  The patient was advised to call back or seek an in-person evaluation if the symptoms worsen or if the condition fails to improve as anticipated.   Return for Patient prefers follow-up 3 weeks-doxy.    Note:  This note was prepared with assistance of Dragon voice recognition software. Occasional wrong-word or sound-a-like substitutions may have occurred due to the inherent  limitations of voice recognition software.   This document serves as a record of services personally performed by Mellody Dance, DO. It was created on her behalf by Toni Amend, a trained medical scribe. The creation of this record is based on the scribe's personal observations and the provider's statements to them.   This case required medical decision making of at least moderate complexity. The above documentation has been reviewed to be accurate and was completed by Marjory Sneddon, D.O.       Patient Care Team    Relationship Specialty Notifications Start End  Mellody Dance, DO PCP - General Family Medicine  05/08/16   Chucky May, MD Consulting Physician Psychiatry  08/29/16   Garrel Ridgel, DPM Consulting Physician Podiatry  01/01/18   Center, [place] Physician Dermatology  01/01/18    Comment: does yrly skin screening.      Dr.  Silver Huguenin, Hoyle Sauer, DO Consulting Physician Rehabilitation  01/01/18    Comment: spine and back pain  Delsa Bern, MD Consulting Physician Obstetrics and Gynecology  01/02/18   Amalia Greenhouse, MD Referring Physician Endocrinology  10/12/19     -Vitals obtained; medications/ allergies reconciled;  personal medical, social, Sx etc.histories were updated by CMA, reviewed by me and are reflected in chart  Patient Active Problem List   Diagnosis Date Noted  . Severe episode of recurrent major depressive disorder, without psychotic features ([place]) 03/05/2017  . Panic attacks 06/09/2016  . Adult hypothyroidism 12/19/2015  . Other insomnia 12/25/2012  . Anxiety 03/14/2012  . Depression 03/14/2012  . Obesity (BMI 30-39.9) 11/03/2013  . h/o Hypertriglyceridemia 12/25/2012  . Reactive airway disease with wheezing 05/21/2018  . Tobacco consumption 05/21/2018  . Tobacco abuse counseling 05/21/2018  . Vitamin D deficiency 06/09/2016  . Emotional crisis as acute reaction to exceptional (gross) stress 10/15/2019  .  Post-trauma response 10/15/2019  . Environmental and seasonal allergies 04/22/2019  . Achilles tendinitis of both lower extremities 05/21/2018  . Chronic right-sided low back pain with right-sided sciatica 05/22/2017  . History of chlamydia 03/22/2017  . Muscle strain 03/22/2017  . Lumbar spine strain, initial encounter 03/22/2017  . Encounter for wellness examination 10/25/2016  . Status post hysterectomy, left ovaries, took cervix. 06/09/2016  . Chronic pain syndrome 12/25/2012  . h/o Blood glucose elevated 12/25/2012  . Chronic fatigue, unspecified 12/25/2012  . Migraine without aura and without status migrainosus, not intractable 12/25/2012  .  Migraines 03/14/2012  . Chronic fatigue disorder 03/14/2012     Current Meds  Medication Sig  . albuterol (VENTOLIN HFA) 108 (90 Base) MCG/ACT inhaler INHALE 1 TO 2 PUFFS INTO THE LUNGS EVERY 4 (FOUR) HOURS AS NEEDED FOR WHEEZING OR SHORTNESS OF BREATH.  Marland Kitchen ALPRAZolam (XANAX) 0.5 MG tablet Take 1-2 tablets by mouth every 6 (six) hours as needed.  Marland Kitchen amphetamine-dextroamphetamine (ADDERALL) 20 MG tablet Take 1 tablet by mouth 3 (three) times daily.  . celecoxib (CELEBREX) 200 MG capsule TAKE 1 CAPSULE TWICE DAILY  . diclofenac sodium (VOLTAREN) 1 % GEL Apply 4 g topically 4 (four) times daily.  . fluticasone (FLONASE) 50 MCG/ACT nasal spray USE 2 SPRAYS IN EACH NOSTRIL EVERY DAY  . gabapentin (NEURONTIN) 300 MG capsule TAKE 1 CAPSULE BY MOUTH THREE TIMES A DAY  . levothyroxine (SYNTHROID) 125 MCG tablet TAKE 1 TABLET EVERY DAY  . methocarbamol (ROBAXIN) 500 MG tablet TAKE 1 TABLET (500 MG TOTAL) BY MOUTH NIGHTLY AS NEEDED  . montelukast (SINGULAIR) 10 MG tablet Take 1 tablet (10 mg total) by mouth at bedtime.  Marland Kitchen QUEtiapine (SEROQUEL) 100 MG tablet Take 100 mg by mouth as needed.  . traZODone (DESYREL) 100 MG tablet Take 3 tablets by mouth at bedtime.   Marland Kitchen venlafaxine XR (EFFEXOR-XR) 75 MG 24 hr capsule Take 3 capsules by mouth every morning.  .  Vitamin D, Ergocalciferol, (DRISDOL) 1.25 MG (50000 UT) CAPS capsule Take 1 capsule (50,000 Units total) by mouth every 7 (seven) days.  . [DISCONTINUED] oxyCODONE-acetaminophen (PERCOCET) 10-325 MG tablet Take 1 tablet by mouth every 8 (eight) hours as needed for up to 7 days for pain.     Allergies  Allergen Reactions  . Naltrexone Other (See Comments)    Panic attack     ROS:  See above HPI for pertinent positives and negatives   Objective:   There were no vitals taken for this visit.  (if some vitals are omitted, this means that patient was UNABLE to obtain them even though they were asked to get them prior to OV today.  They were asked to call us at their earliest convenience with these once obtained.)  General: A & O * 3; visually in no acute distress; in usual state of health.  Skin: Visible skin appears normal and pt's usual skin color HEENT:  EOMI, head is normocephalic and atraumatic.  Sclera are anicteric. Neck has a good range of motion.  Lips are noncyanotic Chest: normal chest excursion and movement Respiratory: speaking in full sentences, no conversational dyspnea; no use of accessory muscles Psych: insight good, mood- appears full

## 2019-11-05 NOTE — Progress Notes (Signed)
Subjective: Ariel King is a 48 y.o. female patient who returns to office for follow up evaluation of right heel pain. Patient reports that pain about the same 7/10 Percocet helps.  Reports that the swelling is looking good and the swelling in her right leg is down and almost the same size as her left leg.  Patient reports that she has been undergoing counseling for the tragic loss of her boyfriend and has been pretty consistent around family with support from relatives and not abusing medication only taking pain medicine twice daily as needed. Patient denies any other pedal complaints at this time.   Patient Active Problem List   Diagnosis Date Noted  . Emotional crisis as acute reaction to exceptional (gross) stress 10/15/2019  . Post-trauma response 10/15/2019  . Environmental and seasonal allergies 04/22/2019  . Reactive airway disease with wheezing 05/21/2018  . Tobacco consumption 05/21/2018  . Tobacco abuse counseling 05/21/2018  . Achilles tendinitis of both lower extremities 05/21/2018  . Chronic right-sided low back pain with right-sided sciatica 05/22/2017  . History of chlamydia 03/22/2017  . Muscle strain 03/22/2017  . Lumbar spine strain, initial encounter 03/22/2017  . Severe episode of recurrent major depressive disorder, without psychotic features ([place]) 03/05/2017  . Encounter for wellness examination 10/25/2016  . Panic attacks 06/09/2016  . Status post hysterectomy, left ovaries, took cervix. 06/09/2016  . Vitamin D deficiency 06/09/2016  . Adult hypothyroidism 12/19/2015  . Obesity (BMI 30-39.9) 11/03/2013  . Chronic pain syndrome 12/25/2012  . h/o Blood glucose elevated 12/25/2012  . h/o Hypertriglyceridemia 12/25/2012  . Other insomnia 12/25/2012  . Chronic fatigue, unspecified 12/25/2012  . Migraine without aura and without status migrainosus, not intractable 12/25/2012  . Anxiety 03/14/2012  . Migraines 03/14/2012  . Depression 03/14/2012  . Chronic  fatigue disorder 03/14/2012    Current Outpatient Medications on File Prior to Visit  Medication Sig Dispense Refill  . albuterol (VENTOLIN HFA) 108 (90 Base) MCG/ACT inhaler INHALE 1 TO 2 PUFFS INTO THE LUNGS EVERY 4 (FOUR) HOURS AS NEEDED FOR WHEEZING OR SHORTNESS OF BREATH. 18 g 0  . ALPRAZolam (XANAX) 0.5 MG tablet Take 1-2 tablets by mouth every 6 (six) hours as needed.    Marland Kitchen amphetamine-dextroamphetamine (ADDERALL) 20 MG tablet Take 1 tablet by mouth 3 (three) times daily.    . celecoxib (CELEBREX) 200 MG capsule TAKE 1 CAPSULE TWICE DAILY 180 capsule 1  . diclofenac sodium (VOLTAREN) 1 % GEL Apply 4 g topically 4 (four) times daily. 100 g 2  . fluticasone (FLONASE) 50 MCG/ACT nasal spray USE 2 SPRAYS IN EACH NOSTRIL EVERY DAY 48 g 1  . gabapentin (NEURONTIN) 300 MG capsule TAKE 1 CAPSULE BY MOUTH THREE TIMES A DAY 90 capsule 5  . levothyroxine (SYNTHROID) 125 MCG tablet TAKE 1 TABLET EVERY DAY 90 tablet 0  . methocarbamol (ROBAXIN) 500 MG tablet TAKE 1 TABLET (500 MG TOTAL) BY MOUTH NIGHTLY AS NEEDED  3  . montelukast (SINGULAIR) 10 MG tablet Take 1 tablet (10 mg total) by mouth at bedtime. 90 tablet 1  . QUEtiapine (SEROQUEL) 100 MG tablet Take 100 mg by mouth as needed.    . traZODone (DESYREL) 100 MG tablet Take 3 tablets by mouth at bedtime.     Marland Kitchen venlafaxine XR (EFFEXOR-XR) 75 MG 24 hr capsule Take 3 capsules by mouth every morning.    . Vitamin D, Ergocalciferol, (DRISDOL) 1.25 MG (50000 UT) CAPS capsule Take 1 capsule (50,000 Units total) by mouth every 7 (  seven) days. 12 capsule 3   No current facility-administered medications on file prior to visit.    Allergies  Allergen Reactions  . Naltrexone Other (See Comments)    Panic attack    Objective:  General: Alert and oriented x3 in no acute distress  Dermatology: No open lesions bilateral lower extremities, no webspace macerations, no ecchymosis bilateral, all nails x 10 are well manicured.  Vascular: Dorsalis Pedis and  Posterior Tibial pedal pulses 1/4, Capillary Fill Time 3 seconds, + pedal hair growth bilateral, Focal edema to right lower extremity, Temperature gradient within normal limits.  Neurology: Johney Maine sensation intact via light touch bilateral.   Musculoskeletal: Mild tenderness with palpation at insertion of the Achilles on right with palpable dell that feels like it is scarring in, Thompson test unable to discern due to pain on right.   Assessment and Plan: Problem List Items Addressed This Visit    None    Visit Diagnoses    Achilles tendon tear, right, subsequent encounter    -  Primary   Acute right ankle pain       Ankle swelling, right         -Complete examination performed - Unna boot applied and advised patient to keep intact for 5 to 7 days after removal may reapply Unna boot supplies given for patient to redress on her own if it is too difficult for her to do may return to using compression sleeve -Patient to remain nonweightbearing with use of knee scooter -Continue with rest ice elevation - Refilled Percocet to take for more severe pain -Patient to return to office 2 weeks for x-rays and possibility of discussing weightbearing with cam boot or sooner if condition worsens.  Landis Martins, DPM

## 2019-11-17 ENCOUNTER — Other Ambulatory Visit: Payer: Self-pay | Admitting: Sports Medicine

## 2019-11-17 ENCOUNTER — Telehealth: Payer: Self-pay | Admitting: *Deleted

## 2019-11-17 DIAGNOSIS — S86011D Strain of right Achilles tendon, subsequent encounter: Secondary | ICD-10-CM

## 2019-11-17 MED ORDER — OXYCODONE-ACETAMINOPHEN 10-325 MG PO TABS: 1.0000 | ORAL_TABLET | Freq: Three times a day (TID) | ORAL | 0 refills | Status: DC | PRN

## 2019-11-17 NOTE — Telephone Encounter (Signed)
Refill sent.

## 2019-11-17 NOTE — Telephone Encounter (Signed)
Pt called states the pain is about the same 8 on the pain scale with shooting pain. I instructed pt to also use alternative methods of pain management, ice 3-4 times a day for 15-20 minutes/session protecting th skin from the ice with a light cloth, rest and elevate and I would send her information to Dr. Cannon Kettle.

## 2019-11-17 NOTE — Telephone Encounter (Signed)
Left message requesting information to pass to Dr. Cannon Kettle tomorrow concerning her pain.

## 2019-11-17 NOTE — Telephone Encounter (Signed)
Pt request refill of the pain medication.

## 2019-11-17 NOTE — Progress Notes (Signed)
Pain medication refilled

## 2019-11-19 ENCOUNTER — Other Ambulatory Visit: Payer: Self-pay | Admitting: Family Medicine

## 2019-11-19 DIAGNOSIS — E559 Vitamin D deficiency, unspecified: Secondary | ICD-10-CM

## 2019-11-19 DIAGNOSIS — J4520 Mild intermittent asthma, uncomplicated: Secondary | ICD-10-CM

## 2019-11-19 DIAGNOSIS — E039 Hypothyroidism, unspecified: Secondary | ICD-10-CM

## 2019-11-19 DIAGNOSIS — J3089 Other allergic rhinitis: Secondary | ICD-10-CM

## 2019-11-21 ENCOUNTER — Other Ambulatory Visit: Payer: Self-pay

## 2019-11-21 ENCOUNTER — Encounter: Payer: Self-pay | Admitting: Sports Medicine

## 2019-11-21 ENCOUNTER — Other Ambulatory Visit: Payer: Self-pay | Admitting: Sports Medicine

## 2019-11-21 ENCOUNTER — Ambulatory Visit (INDEPENDENT_AMBULATORY_CARE_PROVIDER_SITE_OTHER): Payer: Medicare HMO | Admitting: Sports Medicine

## 2019-11-21 ENCOUNTER — Ambulatory Visit (INDEPENDENT_AMBULATORY_CARE_PROVIDER_SITE_OTHER): Payer: Medicare HMO

## 2019-11-21 DIAGNOSIS — M25571 Pain in right ankle and joints of right foot: Secondary | ICD-10-CM

## 2019-11-21 DIAGNOSIS — S86011D Strain of right Achilles tendon, subsequent encounter: Secondary | ICD-10-CM

## 2019-11-21 DIAGNOSIS — M25471 Effusion, right ankle: Secondary | ICD-10-CM

## 2019-11-21 DIAGNOSIS — M79671 Pain in right foot: Secondary | ICD-10-CM

## 2019-11-21 MED ORDER — OXYCODONE-ACETAMINOPHEN 10-325 MG PO TABS: 1.0000 | ORAL_TABLET | Freq: Three times a day (TID) | ORAL | 0 refills | Status: AC | PRN

## 2019-11-21 MED ORDER — IBUPROFEN 800 MG PO TABS: 800.0000 mg | ORAL_TABLET | Freq: Three times a day (TID) | ORAL | 0 refills | Status: DC | PRN

## 2019-11-21 NOTE — Progress Notes (Signed)
Subjective: Ariel King is a 49 y.o. female patient who returns to office for follow up evaluation of right heel pain. Patient reports that pain about the same 7/10 but is off and on and does not hurt that bad all the time.  Reports Percocet helps.  Reports that she rewrapped her foot with the Unna boot but only could keep it on for 2 days not sure if she put it on too tight.  Patient has been using her scooter and wheelchair also taking her medications for pain as needed typically twice a day without any other issues.  Patient denies any other pedal complaints at this time.   Patient Active Problem List   Diagnosis Date Noted  . Emotional crisis as acute reaction to exceptional (gross) stress 10/15/2019  . Post-trauma response 10/15/2019  . Environmental and seasonal allergies 04/22/2019  . Reactive airway disease with wheezing 05/21/2018  . Tobacco consumption 05/21/2018  . Tobacco abuse counseling 05/21/2018  . Achilles tendinitis of both lower extremities 05/21/2018  . Chronic right-sided low back pain with right-sided sciatica 05/22/2017  . History of chlamydia 03/22/2017  . Muscle strain 03/22/2017  . Lumbar spine strain, initial encounter 03/22/2017  . Severe episode of recurrent major depressive disorder, without psychotic features ([place]) 03/05/2017  . Encounter for wellness examination 10/25/2016  . Panic attacks 06/09/2016  . Status post hysterectomy, left ovaries, took cervix. 06/09/2016  . Vitamin D deficiency 06/09/2016  . Adult hypothyroidism 12/19/2015  . Obesity (BMI 30-39.9) 11/03/2013  . Chronic pain syndrome 12/25/2012  . h/o Blood glucose elevated 12/25/2012  . h/o Hypertriglyceridemia 12/25/2012  . Other insomnia 12/25/2012  . Chronic fatigue, unspecified 12/25/2012  . Migraine without aura and without status migrainosus, not intractable 12/25/2012  . Anxiety 03/14/2012  . Migraines 03/14/2012  . Depression 03/14/2012  . Chronic fatigue disorder 03/14/2012     Current Outpatient Medications on File Prior to Visit  Medication Sig Dispense Refill  . albuterol (VENTOLIN HFA) 108 (90 Base) MCG/ACT inhaler INHALE 1 TO 2 PUFFS INTO THE LUNGS EVERY 4 (FOUR) HOURS AS NEEDED FOR WHEEZING OR SHORTNESS OF BREATH. 18 g 0  . ALPRAZolam (XANAX) 0.5 MG tablet Take 1-2 tablets by mouth every 6 (six) hours as needed.    Marland Kitchen amphetamine-dextroamphetamine (ADDERALL) 20 MG tablet Take 1 tablet by mouth 3 (three) times daily.    . celecoxib (CELEBREX) 200 MG capsule TAKE 1 CAPSULE TWICE DAILY 180 capsule 1  . diclofenac sodium (VOLTAREN) 1 % GEL Apply 4 g topically 4 (four) times daily. 100 g 2  . fluticasone (FLONASE) 50 MCG/ACT nasal spray USE 2 SPRAYS IN EACH NOSTRIL EVERY DAY 48 g 1  . gabapentin (NEURONTIN) 300 MG capsule TAKE 1 CAPSULE BY MOUTH THREE TIMES A DAY 90 capsule 5  . levothyroxine (SYNTHROID) 125 MCG tablet TAKE 1 TABLET EVERY DAY 90 tablet 0  . methocarbamol (ROBAXIN) 500 MG tablet TAKE 1 TABLET (500 MG TOTAL) BY MOUTH NIGHTLY AS NEEDED  3  . montelukast (SINGULAIR) 10 MG tablet TAKE 1 TABLET (10 MG TOTAL) BY MOUTH AT BEDTIME. 90 tablet 1  . QUEtiapine (SEROQUEL) 100 MG tablet Take 100 mg by mouth as needed.    . traZODone (DESYREL) 100 MG tablet Take 3 tablets by mouth at bedtime.     Marland Kitchen venlafaxine XR (EFFEXOR-XR) 75 MG 24 hr capsule Take 3 capsules by mouth every morning.    . Vitamin D, Ergocalciferol, (DRISDOL) 1.25 MG (50000 UT) CAPS capsule TAKE 1 CAPSULE EVERY  7   DAYS. 12 capsule 3   No current facility-administered medications on file prior to visit.    Allergies  Allergen Reactions  . Naltrexone Other (See Comments)    Panic attack    Objective:  General: Alert and oriented x3 in no acute distress  Dermatology: No open lesions bilateral lower extremities, no webspace macerations, no ecchymosis bilateral, all nails x 10 are well manicured.  Vascular: Dorsalis Pedis and Posterior Tibial pedal pulses 1/4, Capillary Fill Time 3  seconds, + pedal hair growth bilateral, Focal edema to right lower extremity, Temperature gradient within normal limits.  Neurology: Johney Maine sensation intact via light touch bilateral.   Musculoskeletal: Mild tenderness with palpation at insertion of the Achilles on right with palpable dell that feels like it is scarring in like previous, Thompson test unable to discern due to pain on right.  However there is muscle strength that is on the right with no major limitations.  X-rays right foot there is mild calcifications noted in the Achilles however no other acute findings  Assessment and Plan: Problem List Items Addressed This Visit    None    Visit Diagnoses    Acute right ankle pain    -  Primary   Achilles tendon tear, right, subsequent encounter       Relevant Medications   oxyCODONE-acetaminophen (PERCOCET) 10-325 MG tablet (Start on 11/24/2019)   Ankle swelling, right         -Complete examination performed - Unna boot applied and advised patient to keep intact for 5 to 7 days after removal may use Ace wrap for compression sleeve to assist control -Ordered [facility] ultrasound to reevaluate the healing at the Achilles advised patient if her Achilles area seems to be much improved or almost completely healed may consider allowing patient to weight-bear with boot and cane at next visit meanwhile patient must remain nonweightbearing with use of knee scooter -Continue with rest ice elevation - Refilled Percocet to take for more severe pain as well as prescribed Motrin to take in between doses to help with additional pain and inflammation -Patient to return to office 2 weeks for discussion of ultrasound results and to discuss weightbearing with cam boot or sooner if condition worsens.  Landis Martins, DPM

## 2019-11-24 ENCOUNTER — Telehealth: Payer: Self-pay | Admitting: *Deleted

## 2019-11-24 DIAGNOSIS — S86011D Strain of right Achilles tendon, subsequent encounter: Secondary | ICD-10-CM

## 2019-11-24 DIAGNOSIS — M25471 Effusion, right ankle: Secondary | ICD-10-CM

## 2019-11-24 DIAGNOSIS — M25571 Pain in right ankle and joints of right foot: Secondary | ICD-10-CM

## 2019-11-24 NOTE — Telephone Encounter (Signed)
I informed pt of [facility] 12/01/2019.

## 2019-11-24 NOTE — Telephone Encounter (Signed)
[facility] Jacob Moores scheduled pt for [phone] right lower extremity 12/01/2019 arrive 12:30pm for 1:00pm testing. Faxed to [facility].

## 2019-11-24 NOTE — Addendum Note (Signed)
Addended by: Harriett Sine D on: 11/24/2019 10:27 AM   Modules accepted: Orders

## 2019-11-24 NOTE — Telephone Encounter (Signed)
-----   Message from Landis Martins, [place] sent at 11/21/2019 11:59 AM EST ----- Regarding: [facility] Ultrasound Repeat ultrasound on right to achilles to evaluate for healing

## 2019-12-02 ENCOUNTER — Other Ambulatory Visit: Payer: Self-pay | Admitting: Sports Medicine

## 2019-12-02 ENCOUNTER — Telehealth: Payer: Self-pay | Admitting: *Deleted

## 2019-12-02 MED ORDER — OXYCODONE-ACETAMINOPHEN 10-325 MG PO TABS: 1.0000 | ORAL_TABLET | Freq: Three times a day (TID) | ORAL | 0 refills | Status: DC | PRN

## 2019-12-02 NOTE — Progress Notes (Signed)
Refilled pain medication

## 2019-12-02 NOTE — Telephone Encounter (Signed)
Pt request refill pain medication.

## 2019-12-02 NOTE — Telephone Encounter (Signed)
I spoke with pt and she states she has the same pain in the heel achilles and hip from riding the knee scooter, pain is sharp and some times throbbing.

## 2019-12-02 NOTE — Telephone Encounter (Signed)
Thank you! Refill on pain medication has been sent to her pharmacy -Dr. Chauncey Cruel

## 2019-12-03 ENCOUNTER — Other Ambulatory Visit: Payer: Self-pay | Admitting: Sports Medicine

## 2019-12-03 DIAGNOSIS — S86011D Strain of right Achilles tendon, subsequent encounter: Secondary | ICD-10-CM

## 2019-12-03 DIAGNOSIS — S86011A Strain of right Achilles tendon, initial encounter: Secondary | ICD-10-CM | POA: Diagnosis not present

## 2019-12-05 ENCOUNTER — Encounter: Payer: Self-pay | Admitting: Sports Medicine

## 2019-12-05 ENCOUNTER — Other Ambulatory Visit: Payer: Self-pay

## 2019-12-05 ENCOUNTER — Ambulatory Visit (INDEPENDENT_AMBULATORY_CARE_PROVIDER_SITE_OTHER): Payer: Medicare HMO | Admitting: Sports Medicine

## 2019-12-05 DIAGNOSIS — M25571 Pain in right ankle and joints of right foot: Secondary | ICD-10-CM | POA: Diagnosis not present

## 2019-12-05 DIAGNOSIS — M25471 Effusion, right ankle: Secondary | ICD-10-CM

## 2019-12-05 DIAGNOSIS — R29898 Other symptoms and signs involving the musculoskeletal system: Secondary | ICD-10-CM | POA: Diagnosis not present

## 2019-12-05 DIAGNOSIS — R2681 Unsteadiness on feet: Secondary | ICD-10-CM

## 2019-12-05 DIAGNOSIS — S86011D Strain of right Achilles tendon, subsequent encounter: Secondary | ICD-10-CM

## 2019-12-05 MED ORDER — IBUPROFEN 800 MG PO TABS: 800.0000 mg | ORAL_TABLET | Freq: Three times a day (TID) | ORAL | 0 refills | Status: DC | PRN

## 2019-12-05 MED ORDER — OXYCODONE-ACETAMINOPHEN 10-325 MG PO TABS: 1.0000 | ORAL_TABLET | Freq: Three times a day (TID) | ORAL | 0 refills | Status: AC | PRN

## 2019-12-05 NOTE — Progress Notes (Signed)
Subjective: Ariel King is a 49 y.o. female patient who returns to office for follow up evaluation of right heel pain. Patient reports that pain about the same 7/10 not constant but some episodes can be really bad controlled with her pain medication reports that there has been swelling has been trying to elevate and wear her compression sleeve as instructed.  Patient reports that she went for her ultrasound is here for results.  Patient reports that she has been consistent with using her wheelchair and staying off of her foot. Patient denies any other pedal complaints at this time.   Patient Active Problem List   Diagnosis Date Noted  . Emotional crisis as acute reaction to exceptional (gross) stress 10/15/2019  . Post-trauma response 10/15/2019  . Environmental and seasonal allergies 04/22/2019  . Reactive airway disease with wheezing 05/21/2018  . Tobacco consumption 05/21/2018  . Tobacco abuse counseling 05/21/2018  . Achilles tendinitis of both lower extremities 05/21/2018  . Chronic right-sided low back pain with right-sided sciatica 05/22/2017  . History of chlamydia 03/22/2017  . Muscle strain 03/22/2017  . Lumbar spine strain, initial encounter 03/22/2017  . Severe episode of recurrent major depressive disorder, without psychotic features ([place]) 03/05/2017  . Encounter for wellness examination 10/25/2016  . Panic attacks 06/09/2016  . Status post hysterectomy, left ovaries, took cervix. 06/09/2016  . Vitamin D deficiency 06/09/2016  . Adult hypothyroidism 12/19/2015  . Obesity (BMI 30-39.9) 11/03/2013  . Chronic pain syndrome 12/25/2012  . h/o Blood glucose elevated 12/25/2012  . h/o Hypertriglyceridemia 12/25/2012  . Other insomnia 12/25/2012  . Chronic fatigue, unspecified 12/25/2012  . Migraine without aura and without status migrainosus, not intractable 12/25/2012  . Anxiety 03/14/2012  . Migraines 03/14/2012  . Depression 03/14/2012  . Chronic fatigue disorder  03/14/2012    Current Outpatient Medications on File Prior to Visit  Medication Sig Dispense Refill  . albuterol (VENTOLIN HFA) 108 (90 Base) MCG/ACT inhaler INHALE 1 TO 2 PUFFS INTO THE LUNGS EVERY 4 (FOUR) HOURS AS NEEDED FOR WHEEZING OR SHORTNESS OF BREATH. 18 g 0  . ALPRAZolam (XANAX) 0.5 MG tablet Take 1-2 tablets by mouth every 6 (six) hours as needed.    Marland Kitchen amphetamine-dextroamphetamine (ADDERALL) 20 MG tablet Take 1 tablet by mouth 3 (three) times daily.    . celecoxib (CELEBREX) 200 MG capsule TAKE 1 CAPSULE TWICE DAILY 180 capsule 1  . diclofenac sodium (VOLTAREN) 1 % GEL Apply 4 g topically 4 (four) times daily. 100 g 2  . fluticasone (FLONASE) 50 MCG/ACT nasal spray USE 2 SPRAYS IN EACH NOSTRIL EVERY DAY 48 g 1  . gabapentin (NEURONTIN) 300 MG capsule TAKE 1 CAPSULE BY MOUTH THREE TIMES A DAY 90 capsule 5  . levothyroxine (SYNTHROID) 125 MCG tablet TAKE 1 TABLET EVERY DAY 90 tablet 0  . methocarbamol (ROBAXIN) 500 MG tablet TAKE 1 TABLET (500 MG TOTAL) BY MOUTH NIGHTLY AS NEEDED  3  . montelukast (SINGULAIR) 10 MG tablet TAKE 1 TABLET (10 MG TOTAL) BY MOUTH AT BEDTIME. 90 tablet 1  . QUEtiapine (SEROQUEL) 100 MG tablet Take 100 mg by mouth as needed.    . traZODone (DESYREL) 100 MG tablet Take 3 tablets by mouth at bedtime.     Marland Kitchen venlafaxine XR (EFFEXOR-XR) 75 MG 24 hr capsule Take 3 capsules by mouth every morning.    . Vitamin D, Ergocalciferol, (DRISDOL) 1.25 MG (50000 UT) CAPS capsule TAKE 1 CAPSULE EVERY 7   DAYS. 12 capsule 3   No  current facility-administered medications on file prior to visit.    Allergies  Allergen Reactions  . Naltrexone Other (See Comments)    Panic attack    Objective:  General: Alert and oriented x3 in no acute distress  Dermatology: No open lesions bilateral lower extremities, no webspace macerations, no ecchymosis bilateral, all nails x 10 are well manicured.  Vascular: Dorsalis Pedis and Posterior Tibial pedal pulses 1/4, Capillary Fill  Time 3 seconds, + pedal hair growth bilateral, Focal edema to right lower extremity, Temperature gradient within normal limits.  Neurology: Johney Maine sensation intact via light touch bilateral.   Musculoskeletal: Mild tenderness with palpation at insertion of the Achilles on right with palpable dell that feels like it is scarring in like previous, Thompson test unable to discern due to pain on right.  Muscle strength 4 out of 5 on right.  Ultrasound results reviewed which reveals a healed partial tear with chronic tendon changes and fluid at the insertion of the tendon from a history of tendinopathy  Assessment and Plan: Problem List Items Addressed This Visit    None    Visit Diagnoses    Achilles tendon tear, right, subsequent encounter    -  Primary   Healed as dictated on ultrasound performed on 1/20   Acute right ankle pain       Ankle swelling, right       Gait instability       Weakness of right lower extremity          -Complete examination performed -Ultrasound results reviewed -Prescribed physical therapy patient will benefit from home therapy since it is physically hard for her to make it to in person outpatient therapy due to her history of tear and lack of mobility -Dispensed cam boot patient may slowly attempt weightbearing with assistance from cane or crutch -Dispensed Surgi-tube compression sleeve to use for edema control -Continue with rest ice elevation - Refilled Percocet to take for more severe pain as well as prescribed Motrin to take in between doses to help with additional pain and inflammation patient eligible for pickup of this refill after 12/09/2019 -Patient to return to office 2 weeks for follow-up exam or sooner if condition worsens.  Landis Martins, DPM

## 2019-12-08 ENCOUNTER — Telehealth: Payer: Self-pay | Admitting: *Deleted

## 2019-12-08 DIAGNOSIS — R29898 Other symptoms and signs involving the musculoskeletal system: Secondary | ICD-10-CM

## 2019-12-08 DIAGNOSIS — M25571 Pain in right ankle and joints of right foot: Secondary | ICD-10-CM

## 2019-12-08 DIAGNOSIS — S86011D Strain of right Achilles tendon, subsequent encounter: Secondary | ICD-10-CM

## 2019-12-08 DIAGNOSIS — R2681 Unsteadiness on feet: Secondary | ICD-10-CM

## 2019-12-08 NOTE — Telephone Encounter (Signed)
-----   Message from Landis Martins, [place] sent at 12/05/2019 11:52 AM EST ----- Regarding: Home PT Please send referral for physical therapy provider if patient can get home therapy however patient is aware that home therapy may not be covered and may have to go to an outpatient location.  Patient reports that she has to go to an outpatient location please send her to a location for therapy in [place]. Diagnoses Achilles tear which appears to be healed on most recent x-ray with gait instability and weakness

## 2019-12-08 NOTE — Telephone Encounter (Signed)
Faxed required orders, demographics and clinicals to [facility].

## 2019-12-14 DIAGNOSIS — M5441 Lumbago with sciatica, right side: Secondary | ICD-10-CM | POA: Diagnosis not present

## 2019-12-14 DIAGNOSIS — E559 Vitamin D deficiency, unspecified: Secondary | ICD-10-CM | POA: Diagnosis not present

## 2019-12-14 DIAGNOSIS — G4709 Other insomnia: Secondary | ICD-10-CM | POA: Diagnosis not present

## 2019-12-14 DIAGNOSIS — E669 Obesity, unspecified: Secondary | ICD-10-CM | POA: Diagnosis not present

## 2019-12-14 DIAGNOSIS — S86011D Strain of right Achilles tendon, subsequent encounter: Secondary | ICD-10-CM | POA: Diagnosis not present

## 2019-12-14 DIAGNOSIS — G894 Chronic pain syndrome: Secondary | ICD-10-CM | POA: Diagnosis not present

## 2019-12-14 DIAGNOSIS — F419 Anxiety disorder, unspecified: Secondary | ICD-10-CM | POA: Diagnosis not present

## 2019-12-14 DIAGNOSIS — E039 Hypothyroidism, unspecified: Secondary | ICD-10-CM | POA: Diagnosis not present

## 2019-12-14 DIAGNOSIS — F332 Major depressive disorder, recurrent severe without psychotic features: Secondary | ICD-10-CM | POA: Diagnosis not present

## 2019-12-16 ENCOUNTER — Other Ambulatory Visit: Payer: Self-pay | Admitting: Sports Medicine

## 2019-12-16 MED ORDER — OXYCODONE-ACETAMINOPHEN 10-325 MG PO TABS: 1.0000 | ORAL_TABLET | Freq: Three times a day (TID) | ORAL | 0 refills | Status: AC | PRN

## 2019-12-16 NOTE — Progress Notes (Signed)
Refilled percocet for pain 

## 2019-12-18 ENCOUNTER — Ambulatory Visit: Payer: Medicare HMO | Admitting: Sports Medicine

## 2019-12-18 DIAGNOSIS — F332 Major depressive disorder, recurrent severe without psychotic features: Secondary | ICD-10-CM | POA: Diagnosis not present

## 2019-12-18 DIAGNOSIS — S86011D Strain of right Achilles tendon, subsequent encounter: Secondary | ICD-10-CM | POA: Diagnosis not present

## 2019-12-18 DIAGNOSIS — E669 Obesity, unspecified: Secondary | ICD-10-CM | POA: Diagnosis not present

## 2019-12-18 DIAGNOSIS — G894 Chronic pain syndrome: Secondary | ICD-10-CM | POA: Diagnosis not present

## 2019-12-18 DIAGNOSIS — E039 Hypothyroidism, unspecified: Secondary | ICD-10-CM | POA: Diagnosis not present

## 2019-12-18 DIAGNOSIS — F419 Anxiety disorder, unspecified: Secondary | ICD-10-CM | POA: Diagnosis not present

## 2019-12-18 DIAGNOSIS — E559 Vitamin D deficiency, unspecified: Secondary | ICD-10-CM | POA: Diagnosis not present

## 2019-12-18 DIAGNOSIS — G4709 Other insomnia: Secondary | ICD-10-CM | POA: Diagnosis not present

## 2019-12-18 DIAGNOSIS — M5441 Lumbago with sciatica, right side: Secondary | ICD-10-CM | POA: Diagnosis not present

## 2019-12-23 ENCOUNTER — Other Ambulatory Visit: Payer: Self-pay | Admitting: Sports Medicine

## 2019-12-23 ENCOUNTER — Telehealth: Payer: Self-pay

## 2019-12-23 DIAGNOSIS — S86011D Strain of right Achilles tendon, subsequent encounter: Secondary | ICD-10-CM

## 2019-12-23 MED ORDER — OXYCODONE-ACETAMINOPHEN 10-325 MG PO TABS: 1.0000 | ORAL_TABLET | Freq: Three times a day (TID) | ORAL | 0 refills | Status: AC | PRN

## 2019-12-23 NOTE — Telephone Encounter (Signed)
Pt LVm requesting pain medication RF sent to her pharmacy at [facility] in [place], pt also specified she would like to see you in sooner.

## 2019-12-23 NOTE — Progress Notes (Signed)
Refilled pain medication for tear

## 2019-12-23 NOTE — Telephone Encounter (Signed)
Refill sent  -Dr.Emrie Gayle

## 2019-12-25 DIAGNOSIS — G4709 Other insomnia: Secondary | ICD-10-CM | POA: Diagnosis not present

## 2019-12-25 DIAGNOSIS — S86011D Strain of right Achilles tendon, subsequent encounter: Secondary | ICD-10-CM | POA: Diagnosis not present

## 2019-12-25 DIAGNOSIS — F332 Major depressive disorder, recurrent severe without psychotic features: Secondary | ICD-10-CM | POA: Diagnosis not present

## 2019-12-25 DIAGNOSIS — E559 Vitamin D deficiency, unspecified: Secondary | ICD-10-CM | POA: Diagnosis not present

## 2019-12-25 DIAGNOSIS — M5441 Lumbago with sciatica, right side: Secondary | ICD-10-CM | POA: Diagnosis not present

## 2019-12-25 DIAGNOSIS — F419 Anxiety disorder, unspecified: Secondary | ICD-10-CM | POA: Diagnosis not present

## 2019-12-25 DIAGNOSIS — E669 Obesity, unspecified: Secondary | ICD-10-CM | POA: Diagnosis not present

## 2019-12-25 DIAGNOSIS — G894 Chronic pain syndrome: Secondary | ICD-10-CM | POA: Diagnosis not present

## 2019-12-25 DIAGNOSIS — E039 Hypothyroidism, unspecified: Secondary | ICD-10-CM | POA: Diagnosis not present

## 2019-12-26 DIAGNOSIS — E669 Obesity, unspecified: Secondary | ICD-10-CM | POA: Diagnosis not present

## 2019-12-26 DIAGNOSIS — G4709 Other insomnia: Secondary | ICD-10-CM | POA: Diagnosis not present

## 2019-12-26 DIAGNOSIS — E039 Hypothyroidism, unspecified: Secondary | ICD-10-CM | POA: Diagnosis not present

## 2019-12-26 DIAGNOSIS — E559 Vitamin D deficiency, unspecified: Secondary | ICD-10-CM | POA: Diagnosis not present

## 2019-12-26 DIAGNOSIS — M5441 Lumbago with sciatica, right side: Secondary | ICD-10-CM | POA: Diagnosis not present

## 2019-12-26 DIAGNOSIS — G894 Chronic pain syndrome: Secondary | ICD-10-CM | POA: Diagnosis not present

## 2019-12-26 DIAGNOSIS — F419 Anxiety disorder, unspecified: Secondary | ICD-10-CM | POA: Diagnosis not present

## 2019-12-26 DIAGNOSIS — F332 Major depressive disorder, recurrent severe without psychotic features: Secondary | ICD-10-CM | POA: Diagnosis not present

## 2019-12-26 DIAGNOSIS — S86011D Strain of right Achilles tendon, subsequent encounter: Secondary | ICD-10-CM | POA: Diagnosis not present

## 2019-12-29 ENCOUNTER — Other Ambulatory Visit: Payer: Self-pay | Admitting: Sports Medicine

## 2019-12-29 DIAGNOSIS — S86011D Strain of right Achilles tendon, subsequent encounter: Secondary | ICD-10-CM

## 2019-12-29 MED ORDER — OXYCODONE-ACETAMINOPHEN 7.5-325 MG PO TABS: 1.0000 | ORAL_TABLET | Freq: Three times a day (TID) | ORAL | 0 refills | Status: AC | PRN

## 2019-12-29 NOTE — Progress Notes (Signed)
Refilled pain medication and reduced from 10mg  to 7.5mg  Percocet

## 2019-12-29 NOTE — Telephone Encounter (Signed)
Refill sent.

## 2019-12-29 NOTE — Telephone Encounter (Signed)
Pt. Called requesting refill on her pain medication. Please advice

## 2019-12-30 NOTE — Telephone Encounter (Signed)
LVM to pt stating pain med RF sent to pharmacy

## 2020-01-05 ENCOUNTER — Telehealth: Payer: Self-pay | Admitting: *Deleted

## 2020-01-05 NOTE — Telephone Encounter (Signed)
Patient called and left a message for pain medicine and I called the patient back and left a message. Lattie Haw

## 2020-01-06 ENCOUNTER — Other Ambulatory Visit: Payer: Self-pay | Admitting: Sports Medicine

## 2020-01-06 MED ORDER — OXYCODONE-ACETAMINOPHEN 7.5-325 MG PO TABS: 1.0000 | ORAL_TABLET | Freq: Four times a day (QID) | ORAL | 0 refills | Status: AC | PRN

## 2020-01-06 NOTE — Telephone Encounter (Signed)
Left message informing Ariel King that I would pass her request to Dr. Cannon Kettle for pain medication, but she should leave me more information concerning her pain, so that Dr. 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

## 2020-01-06 NOTE — Progress Notes (Signed)
Refilled pain medication

## 2020-01-06 NOTE — Telephone Encounter (Signed)
Pt called states she is returning my call.

## 2020-01-06 NOTE — Telephone Encounter (Signed)
Refill sent of percocet for pain

## 2020-01-07 NOTE — Telephone Encounter (Signed)
Pt called states the pain goes from her hip to the foot and is a level 8 with tingling as well.

## 2020-01-07 NOTE — Telephone Encounter (Signed)
I spoke with Ariel King and asked if she felt this pain was worse than what she was previously experiencing and she said it was but would wait to seen Dr. Cannon Kettle 01/13/2020 and I informed her that Dr. Cannon Kettle had sent in the pain medication.

## 2020-01-12 DIAGNOSIS — G4709 Other insomnia: Secondary | ICD-10-CM | POA: Diagnosis not present

## 2020-01-12 DIAGNOSIS — S86011D Strain of right Achilles tendon, subsequent encounter: Secondary | ICD-10-CM | POA: Diagnosis not present

## 2020-01-12 DIAGNOSIS — G894 Chronic pain syndrome: Secondary | ICD-10-CM | POA: Diagnosis not present

## 2020-01-12 DIAGNOSIS — F332 Major depressive disorder, recurrent severe without psychotic features: Secondary | ICD-10-CM | POA: Diagnosis not present

## 2020-01-12 DIAGNOSIS — E559 Vitamin D deficiency, unspecified: Secondary | ICD-10-CM | POA: Diagnosis not present

## 2020-01-12 DIAGNOSIS — F419 Anxiety disorder, unspecified: Secondary | ICD-10-CM | POA: Diagnosis not present

## 2020-01-12 DIAGNOSIS — E669 Obesity, unspecified: Secondary | ICD-10-CM | POA: Diagnosis not present

## 2020-01-12 DIAGNOSIS — M5441 Lumbago with sciatica, right side: Secondary | ICD-10-CM | POA: Diagnosis not present

## 2020-01-12 DIAGNOSIS — E039 Hypothyroidism, unspecified: Secondary | ICD-10-CM | POA: Diagnosis not present

## 2020-01-13 ENCOUNTER — Ambulatory Visit: Payer: Medicare HMO | Admitting: Sports Medicine

## 2020-01-14 ENCOUNTER — Telehealth: Payer: Self-pay | Admitting: Sports Medicine

## 2020-01-14 ENCOUNTER — Encounter: Payer: Self-pay | Admitting: Sports Medicine

## 2020-01-14 ENCOUNTER — Other Ambulatory Visit: Payer: Self-pay

## 2020-01-14 ENCOUNTER — Ambulatory Visit (INDEPENDENT_AMBULATORY_CARE_PROVIDER_SITE_OTHER): Payer: Medicare HMO | Admitting: Sports Medicine

## 2020-01-14 DIAGNOSIS — S86011D Strain of right Achilles tendon, subsequent encounter: Secondary | ICD-10-CM | POA: Diagnosis not present

## 2020-01-14 DIAGNOSIS — M25571 Pain in right ankle and joints of right foot: Secondary | ICD-10-CM

## 2020-01-14 DIAGNOSIS — R2681 Unsteadiness on feet: Secondary | ICD-10-CM | POA: Diagnosis not present

## 2020-01-14 DIAGNOSIS — R29898 Other symptoms and signs involving the musculoskeletal system: Secondary | ICD-10-CM

## 2020-01-14 MED ORDER — DICLOFENAC SODIUM 1 % EX GEL: 4.0000 g | Freq: Four times a day (QID) | CUTANEOUS | 2 refills | Status: DC

## 2020-01-14 MED ORDER — OXYCODONE-ACETAMINOPHEN 10-325 MG PO TABS: 1.0000 | ORAL_TABLET | Freq: Three times a day (TID) | ORAL | 0 refills | Status: DC | PRN

## 2020-01-14 MED ORDER — DICLOFENAC EPOLAMINE 1.3 % EX PTCH: 1.0000 | MEDICATED_PATCH | Freq: Two times a day (BID) | CUTANEOUS | 1 refills | Status: DC

## 2020-01-14 NOTE — Progress Notes (Signed)
Subjective: Ariel King is a 49 y.o. female patient who returns to office for follow up evaluation of right heel pain. Patient reports that she has had increased episodes of pain with physical therapy states that they have been makes her hurt more at most all the way from the foot to the hip reports that pain is 9 out of 10 sharp constant and sometimes feels swollen reports that last night she had to ice and use heat which helped.  Patient is currently walking in cam boot using cane and taking Percocet as needed for severe episodes of pain.  Patient denies any other pedal complaints at this time.   Patient Active Problem List   Diagnosis Date Noted  . Emotional crisis as acute reaction to exceptional (gross) stress 10/15/2019  . Post-trauma response 10/15/2019  . Environmental and seasonal allergies 04/22/2019  . Reactive airway disease with wheezing 05/21/2018  . Tobacco consumption 05/21/2018  . Tobacco abuse counseling 05/21/2018  . Achilles tendinitis of both lower extremities 05/21/2018  . Chronic right-sided low back pain with right-sided sciatica 05/22/2017  . History of chlamydia 03/22/2017  . Muscle strain 03/22/2017  . Lumbar spine strain, initial encounter 03/22/2017  . Severe episode of recurrent major depressive disorder, without psychotic features ([place]) 03/05/2017  . Encounter for wellness examination 10/25/2016  . Panic attacks 06/09/2016  . Status post hysterectomy, left ovaries, took cervix. 06/09/2016  . Vitamin D deficiency 06/09/2016  . Adult hypothyroidism 12/19/2015  . Obesity (BMI 30-39.9) 11/03/2013  . Chronic pain syndrome 12/25/2012  . h/o Blood glucose elevated 12/25/2012  . h/o Hypertriglyceridemia 12/25/2012  . Other insomnia 12/25/2012  . Chronic fatigue, unspecified 12/25/2012  . Migraine without aura and without status migrainosus, not intractable 12/25/2012  . Anxiety 03/14/2012  . Migraines 03/14/2012  . Depression 03/14/2012  . Chronic fatigue  disorder 03/14/2012    Current Outpatient Medications on File Prior to Visit  Medication Sig Dispense Refill  . albuterol (VENTOLIN HFA) 108 (90 Base) MCG/ACT inhaler INHALE 1 TO 2 PUFFS INTO THE LUNGS EVERY 4 (FOUR) HOURS AS NEEDED FOR WHEEZING OR SHORTNESS OF BREATH. 18 g 0  . ALPRAZolam (XANAX) 0.5 MG tablet Take 1-2 tablets by mouth every 6 (six) hours as needed.    Marland Kitchen amphetamine-dextroamphetamine (ADDERALL) 20 MG tablet Take 1 tablet by mouth 3 (three) times daily.    . celecoxib (CELEBREX) 200 MG capsule TAKE 1 CAPSULE TWICE DAILY 180 capsule 1  . fluticasone (FLONASE) 50 MCG/ACT nasal spray USE 2 SPRAYS IN EACH NOSTRIL EVERY DAY 48 g 1  . gabapentin (NEURONTIN) 300 MG capsule TAKE 1 CAPSULE BY MOUTH THREE TIMES A DAY 90 capsule 5  . ibuprofen (ADVIL) 800 MG tablet Take 1 tablet (800 mg total) by mouth every 8 (eight) hours as needed. 30 tablet 0  . levothyroxine (SYNTHROID) 125 MCG tablet TAKE 1 TABLET EVERY DAY 90 tablet 0  . methocarbamol (ROBAXIN) 500 MG tablet TAKE 1 TABLET (500 MG TOTAL) BY MOUTH NIGHTLY AS NEEDED  3  . montelukast (SINGULAIR) 10 MG tablet TAKE 1 TABLET (10 MG TOTAL) BY MOUTH AT BEDTIME. 90 tablet 1  . QUEtiapine (SEROQUEL) 100 MG tablet Take 100 mg by mouth as needed.    . traZODone (DESYREL) 100 MG tablet Take 3 tablets by mouth at bedtime.     Marland Kitchen venlafaxine XR (EFFEXOR-XR) 75 MG 24 hr capsule Take 3 capsules by mouth every morning.    . Vitamin D, Ergocalciferol, (DRISDOL) 1.25 MG (50000 UT) CAPS  capsule TAKE 1 CAPSULE EVERY 7   DAYS. 12 capsule 3   No current facility-administered medications on file prior to visit.    Allergies  Allergen Reactions  . Naltrexone Other (See Comments)    Panic attack    Objective:  General: Alert and oriented x3 in no acute distress  Dermatology: No open lesions bilateral lower extremities, no webspace macerations, no ecchymosis bilateral, all nails x 10 are well manicured.  Vascular: Dorsalis Pedis and Posterior  Tibial pedal pulses 1/4, Capillary Fill Time 3 seconds, + pedal hair growth bilateral, Focal edema to right lower extremity, Temperature gradient within normal limits.  Neurology: Johney Maine sensation intact via light touch bilateral.   Musculoskeletal: Mild tenderness with palpation at insertion of the Achilles on right with no obvious palpable dell appears to be completely filled in with scar, increase mobility is noted at the ankle [place] Silos sign appears to be negative on the right, subjective pain that radiates from the heel all the way up to the thigh and hip.  Muscle strength 4 out of 5 on right.   Assessment and Plan: Problem List Items Addressed This Visit    None    Visit Diagnoses    Achilles tendon tear, right, subsequent encounter    -  Primary   Now healed partial tear with chronic degeneration on ultrasound   Relevant Medications   diclofenac Sodium (VOLTAREN) 1 % GEL   diclofenac (FLECTOR) 1.3 % PTCH   oxyCODONE-acetaminophen (PERCOCET) 10-325 MG tablet   Acute right ankle pain       Gait instability       Weakness of right lower extremity          -Complete examination performed -Continue with physical therapy and advised patient is normal to have increased pain after therapy due to her significant weakness and the need to build strength and stability  -Continue with rest ice elevation and heat as needed -Advised patient to continue with cam boot and use of cane -Re-Dispensed Surgi-tube compression sleeve to use for edema control -Refilled Percocet and prescribed diclofenac gel and Flector patch to apply to areas that are painful -Patient to return to office 3 weeks for follow-up exam or sooner if condition worsens.  Landis Martins, DPM

## 2020-01-14 NOTE — Telephone Encounter (Signed)
The Rx for the Gel has been sent to her pharmacy. She can get that instead of the patches due to cost  Thanks Dr. Chauncey Cruel

## 2020-01-14 NOTE — Telephone Encounter (Signed)
Pt called to inform the nurse that she would like to get Voltaren instead of the patches due to cost.

## 2020-01-14 NOTE — Telephone Encounter (Signed)
Left message informing pt Dr. Cannon Kettle said she could use the OTC Voltaren gel.

## 2020-01-21 ENCOUNTER — Other Ambulatory Visit: Payer: Self-pay | Admitting: Sports Medicine

## 2020-01-21 DIAGNOSIS — F332 Major depressive disorder, recurrent severe without psychotic features: Secondary | ICD-10-CM | POA: Diagnosis not present

## 2020-01-21 DIAGNOSIS — E669 Obesity, unspecified: Secondary | ICD-10-CM | POA: Diagnosis not present

## 2020-01-21 DIAGNOSIS — G4709 Other insomnia: Secondary | ICD-10-CM | POA: Diagnosis not present

## 2020-01-21 DIAGNOSIS — S86011D Strain of right Achilles tendon, subsequent encounter: Secondary | ICD-10-CM | POA: Diagnosis not present

## 2020-01-21 DIAGNOSIS — E039 Hypothyroidism, unspecified: Secondary | ICD-10-CM | POA: Diagnosis not present

## 2020-01-21 DIAGNOSIS — M5441 Lumbago with sciatica, right side: Secondary | ICD-10-CM | POA: Diagnosis not present

## 2020-01-21 DIAGNOSIS — F419 Anxiety disorder, unspecified: Secondary | ICD-10-CM | POA: Diagnosis not present

## 2020-01-21 DIAGNOSIS — G894 Chronic pain syndrome: Secondary | ICD-10-CM | POA: Diagnosis not present

## 2020-01-21 DIAGNOSIS — E559 Vitamin D deficiency, unspecified: Secondary | ICD-10-CM | POA: Diagnosis not present

## 2020-01-30 ENCOUNTER — Telehealth: Payer: Self-pay | Admitting: *Deleted

## 2020-01-30 ENCOUNTER — Other Ambulatory Visit: Payer: Self-pay | Admitting: Sports Medicine

## 2020-01-30 DIAGNOSIS — S86011D Strain of right Achilles tendon, subsequent encounter: Secondary | ICD-10-CM

## 2020-01-30 MED ORDER — OXYCODONE-ACETAMINOPHEN 10-325 MG PO TABS: ORAL_TABLET | ORAL | 0 refills | Status: DC

## 2020-01-30 NOTE — Telephone Encounter (Signed)
Pt states she is still having the pain that runs from her hip to her foot 8 or 9 on the pain level and would like her pain medication.

## 2020-01-30 NOTE — Progress Notes (Signed)
Refilled pain meds 

## 2020-02-04 ENCOUNTER — Other Ambulatory Visit: Payer: Self-pay

## 2020-02-04 ENCOUNTER — Encounter: Payer: Self-pay | Admitting: Sports Medicine

## 2020-02-04 ENCOUNTER — Ambulatory Visit (INDEPENDENT_AMBULATORY_CARE_PROVIDER_SITE_OTHER): Payer: Medicare HMO | Admitting: Sports Medicine

## 2020-02-04 DIAGNOSIS — M25471 Effusion, right ankle: Secondary | ICD-10-CM

## 2020-02-04 DIAGNOSIS — R2681 Unsteadiness on feet: Secondary | ICD-10-CM

## 2020-02-04 DIAGNOSIS — R29898 Other symptoms and signs involving the musculoskeletal system: Secondary | ICD-10-CM

## 2020-02-04 DIAGNOSIS — S86011D Strain of right Achilles tendon, subsequent encounter: Secondary | ICD-10-CM | POA: Diagnosis not present

## 2020-02-04 MED ORDER — IBUPROFEN 800 MG PO TABS: 800.0000 mg | ORAL_TABLET | Freq: Three times a day (TID) | ORAL | 0 refills | Status: DC | PRN

## 2020-02-04 MED ORDER — OXYCODONE-ACETAMINOPHEN 10-325 MG PO TABS: ORAL_TABLET | ORAL | 0 refills | Status: DC

## 2020-02-04 MED ORDER — GABAPENTIN 300 MG PO CAPS: 300.0000 mg | ORAL_CAPSULE | Freq: Four times a day (QID) | ORAL | 3 refills | Status: DC

## 2020-02-04 NOTE — Progress Notes (Signed)
Subjective: Ariel King is a 49 y.o. female patient who returns to office for follow up evaluation of right heel pain. Patient reports that she has had increased episodes of pain and reports that pain sometimes can be 10 out of 10 especially for the last 2 days admits to some swelling has been using her boot having physical therapy help her at home icing using heat using her topical pain cream or rub compression socks and taking Percocet as needed for severe episodes of pain with relief.  Patient denies any other pedal complaints at this time.   Patient Active Problem List   Diagnosis Date Noted  . Emotional crisis as acute reaction to exceptional (gross) stress 10/15/2019  . Post-trauma response 10/15/2019  . Environmental and seasonal allergies 04/22/2019  . Reactive airway disease with wheezing 05/21/2018  . Tobacco consumption 05/21/2018  . Tobacco abuse counseling 05/21/2018  . Achilles tendinitis of both lower extremities 05/21/2018  . Chronic right-sided low back pain with right-sided sciatica 05/22/2017  . History of chlamydia 03/22/2017  . Muscle strain 03/22/2017  . Lumbar spine strain, initial encounter 03/22/2017  . Severe episode of recurrent major depressive disorder, without psychotic features ([place]) 03/05/2017  . Encounter for wellness examination 10/25/2016  . Panic attacks 06/09/2016  . Status post hysterectomy, left ovaries, took cervix. 06/09/2016  . Vitamin D deficiency 06/09/2016  . Adult hypothyroidism 12/19/2015  . Obesity (BMI 30-39.9) 11/03/2013  . Chronic pain syndrome 12/25/2012  . h/o Blood glucose elevated 12/25/2012  . h/o Hypertriglyceridemia 12/25/2012  . Other insomnia 12/25/2012  . Chronic fatigue, unspecified 12/25/2012  . Migraine without aura and without status migrainosus, not intractable 12/25/2012  . Anxiety 03/14/2012  . Migraines 03/14/2012  . Depression 03/14/2012  . Chronic fatigue disorder 03/14/2012    Current Outpatient  Medications on File Prior to Visit  Medication Sig Dispense Refill  . albuterol (VENTOLIN HFA) 108 (90 Base) MCG/ACT inhaler INHALE 1 TO 2 PUFFS INTO THE LUNGS EVERY 4 (FOUR) HOURS AS NEEDED FOR WHEEZING OR SHORTNESS OF BREATH. 18 g 0  . ALPRAZolam (XANAX) 0.5 MG tablet Take 1-2 tablets by mouth every 6 (six) hours as needed.    Marland Kitchen amphetamine-dextroamphetamine (ADDERALL) 20 MG tablet Take 1 tablet by mouth 3 (three) times daily.    . celecoxib (CELEBREX) 200 MG capsule TAKE 1 CAPSULE TWICE DAILY 180 capsule 1  . diclofenac (FLECTOR) 1.3 % PTCH Place 1 patch onto the skin 2 (two) times daily. 60 patch 1  . diclofenac Sodium (VOLTAREN) 1 % GEL Apply 4 g topically 4 (four) times daily. 150 g 2  . fluticasone (FLONASE) 50 MCG/ACT nasal spray USE 2 SPRAYS IN EACH NOSTRIL EVERY DAY 48 g 1  . levothyroxine (SYNTHROID) 125 MCG tablet TAKE 1 TABLET EVERY DAY 90 tablet 0  . methocarbamol (ROBAXIN) 500 MG tablet TAKE 1 TABLET (500 MG TOTAL) BY MOUTH NIGHTLY AS NEEDED  3  . montelukast (SINGULAIR) 10 MG tablet TAKE 1 TABLET (10 MG TOTAL) BY MOUTH AT BEDTIME. 90 tablet 1  . QUEtiapine (SEROQUEL) 100 MG tablet Take 100 mg by mouth as needed.    . traZODone (DESYREL) 100 MG tablet Take 3 tablets by mouth at bedtime.     Marland Kitchen venlafaxine XR (EFFEXOR-XR) 75 MG 24 hr capsule Take 3 capsules by mouth every morning.    . Vitamin D, Ergocalciferol, (DRISDOL) 1.25 MG (50000 UT) CAPS capsule TAKE 1 CAPSULE EVERY 7   DAYS. 12 capsule 3   No current facility-administered  medications on file prior to visit.    Allergies  Allergen Reactions  . Naltrexone Other (See Comments)    Panic attack    Objective:  General: Alert and oriented x3 in no acute distress  Dermatology: No open lesions bilateral lower extremities, no webspace macerations, no ecchymosis bilateral, all nails x 10 are well manicured except right great toenail where it is severely thickened.  Vascular: Dorsalis Pedis and Posterior Tibial pedal pulses  1/4, Capillary Fill Time 3 seconds, + pedal hair growth bilateral, Focal edema to right lower extremity, Temperature gradient within normal limits.  Neurology: Johney Maine sensation intact via light touch bilateral.   Musculoskeletal: Mild tenderness with palpation at insertion of the Achilles on right with no obvious palpable dell appears to be completely filled in with scar like before, increase mobility is noted at the ankle [place] Silos sign appears to be negative on the right, subjective pain that radiates from the heel all the way up to the thigh and hip with some occasional spasming.  Muscle strength 4 out of 5 on right.   Assessment and Plan: Problem List Items Addressed This Visit    None    Visit Diagnoses    Gait instability    -  Primary   Achilles tendon tear, right, subsequent encounter       Now healed partial tear with chronic degeneration on ultrasound clinically healing well   Relevant Medications   oxyCODONE-acetaminophen (PERCOCET) 10-325 MG tablet   Weakness of right lower extremity       Ankle swelling, right          -Complete examination performed -Continue with physical therapy and advised patient is normal to have increased pain after therapy due to her significant weakness and the need to build strength and stability  -Continue with rest ice elevation and heat as needed -Advised patient to continue with cam boot and use of cane -Re-Dispensed Surgi-tube compression sleeve to use for edema control -Refilled Percocet and prescribed diclofenac gel and Flector patch to apply to areas that are painful -And no additional charge mechanically debrided right hallux nail using a sterile nail nipper and advised use of Vicks VapoRub to help soften the thickened layers of right great toenail -Patient to return to office 4 weeks for follow-up exam or sooner if condition worsens.  Landis Martins, DPM

## 2020-02-05 DIAGNOSIS — E669 Obesity, unspecified: Secondary | ICD-10-CM | POA: Diagnosis not present

## 2020-02-05 DIAGNOSIS — G4709 Other insomnia: Secondary | ICD-10-CM | POA: Diagnosis not present

## 2020-02-05 DIAGNOSIS — E559 Vitamin D deficiency, unspecified: Secondary | ICD-10-CM | POA: Diagnosis not present

## 2020-02-05 DIAGNOSIS — M5441 Lumbago with sciatica, right side: Secondary | ICD-10-CM | POA: Diagnosis not present

## 2020-02-05 DIAGNOSIS — E039 Hypothyroidism, unspecified: Secondary | ICD-10-CM | POA: Diagnosis not present

## 2020-02-05 DIAGNOSIS — F332 Major depressive disorder, recurrent severe without psychotic features: Secondary | ICD-10-CM | POA: Diagnosis not present

## 2020-02-05 DIAGNOSIS — S86011D Strain of right Achilles tendon, subsequent encounter: Secondary | ICD-10-CM | POA: Diagnosis not present

## 2020-02-05 DIAGNOSIS — F419 Anxiety disorder, unspecified: Secondary | ICD-10-CM | POA: Diagnosis not present

## 2020-02-05 DIAGNOSIS — G894 Chronic pain syndrome: Secondary | ICD-10-CM | POA: Diagnosis not present

## 2020-02-10 ENCOUNTER — Telehealth: Payer: Self-pay | Admitting: *Deleted

## 2020-02-10 ENCOUNTER — Other Ambulatory Visit: Payer: Self-pay | Admitting: Sports Medicine

## 2020-02-10 DIAGNOSIS — S86011D Strain of right Achilles tendon, subsequent encounter: Secondary | ICD-10-CM

## 2020-02-10 MED ORDER — OXYCODONE-ACETAMINOPHEN 10-325 MG PO TABS: ORAL_TABLET | ORAL | 0 refills | Status: DC

## 2020-02-10 NOTE — Telephone Encounter (Signed)
Refilled pain meds 

## 2020-02-10 NOTE — Telephone Encounter (Signed)
Pt states she is a level 8 and for some reason is having more pain and would like to have the pain medication called in 1 day early.

## 2020-02-10 NOTE — Progress Notes (Signed)
Refilled percocet

## 2020-02-19 ENCOUNTER — Other Ambulatory Visit: Payer: Self-pay | Admitting: Sports Medicine

## 2020-02-19 ENCOUNTER — Telehealth: Payer: Self-pay | Admitting: Urology

## 2020-02-19 DIAGNOSIS — S86011D Strain of right Achilles tendon, subsequent encounter: Secondary | ICD-10-CM

## 2020-02-19 MED ORDER — OXYCODONE-ACETAMINOPHEN 10-325 MG PO TABS: ORAL_TABLET | ORAL | 0 refills | Status: DC

## 2020-02-19 MED ORDER — IBUPROFEN 800 MG PO TABS: 800.0000 mg | ORAL_TABLET | Freq: Three times a day (TID) | ORAL | 0 refills | Status: DC | PRN

## 2020-02-19 NOTE — Telephone Encounter (Signed)
Pt called and says pain level is an 8 out of 10. She would like a refill on pain meds. Please Advise.

## 2020-02-19 NOTE — Progress Notes (Signed)
Refilled Percocet and Ibuprofen for achilles pain after tear -Dr. Chauncey Cruel

## 2020-02-19 NOTE — Telephone Encounter (Signed)
Refill sent.

## 2020-02-20 ENCOUNTER — Other Ambulatory Visit: Payer: Self-pay | Admitting: Sports Medicine

## 2020-02-26 ENCOUNTER — Other Ambulatory Visit: Payer: Self-pay | Admitting: Sports Medicine

## 2020-02-26 DIAGNOSIS — S86011D Strain of right Achilles tendon, subsequent encounter: Secondary | ICD-10-CM

## 2020-02-26 MED ORDER — OXYCODONE-ACETAMINOPHEN 7.5-325 MG PO TABS: 1.0000 | ORAL_TABLET | Freq: Three times a day (TID) | ORAL | 0 refills | Status: DC | PRN

## 2020-02-26 NOTE — Progress Notes (Signed)
Refilled percocet for 8/10 pain at achilles tear -Dr. Chauncey Cruel

## 2020-03-01 ENCOUNTER — Telehealth: Payer: Self-pay

## 2020-03-01 ENCOUNTER — Other Ambulatory Visit: Payer: Self-pay | Admitting: Sports Medicine

## 2020-03-01 MED ORDER — IBUPROFEN 800 MG PO TABS: 800.0000 mg | ORAL_TABLET | Freq: Three times a day (TID) | ORAL | 0 refills | Status: DC | PRN

## 2020-03-01 NOTE — Telephone Encounter (Signed)
I just refilled her Percocet on 4/15. She is not eligible for another refill until 4/22. However I did refill Motrin to see if this will help for her to take in between doses of her percocet pain medication or for mild pain. Also encourage her to rest, ice, elevate, and also may try soaking with epsom salt or topical pain rub. -Dr. Cannon Kettle

## 2020-03-01 NOTE — Progress Notes (Signed)
Med review performed. Patient no eligible for percocet again until 4/22. I just refilled it on 4/15

## 2020-03-01 NOTE — Telephone Encounter (Signed)
Pt called requesting a RF on her pain medication. Pt states she has an appt for Wed. but is having severe pain today and would like something Rx.  Pharmacy: [facility]

## 2020-03-02 NOTE — Telephone Encounter (Signed)
Called and spoke with pt about the Dr's instructions. Pt stated understanding that she can not have another percocet RF until the 22nd and on the mean while Dr. Cannon Kettle resent a Rx on her motrin. Pt stated understanding

## 2020-03-03 ENCOUNTER — Ambulatory Visit: Payer: Medicare HMO | Admitting: Sports Medicine

## 2020-03-03 ENCOUNTER — Other Ambulatory Visit: Payer: Self-pay | Admitting: Sports Medicine

## 2020-03-03 DIAGNOSIS — S86011D Strain of right Achilles tendon, subsequent encounter: Secondary | ICD-10-CM

## 2020-03-03 NOTE — Telephone Encounter (Signed)
Refilled pain medication. Patient is aware that she in not eligible to pick up this medication until 4/22

## 2020-03-10 ENCOUNTER — Ambulatory Visit: Payer: Medicare HMO | Admitting: Sports Medicine

## 2020-03-12 ENCOUNTER — Telehealth: Payer: Self-pay

## 2020-03-12 ENCOUNTER — Other Ambulatory Visit: Payer: Self-pay | Admitting: Sports Medicine

## 2020-03-12 DIAGNOSIS — S86011D Strain of right Achilles tendon, subsequent encounter: Secondary | ICD-10-CM

## 2020-03-12 MED ORDER — OXYCODONE-ACETAMINOPHEN 7.5-325 MG PO TABS: 1.0000 | ORAL_TABLET | Freq: Four times a day (QID) | ORAL | 0 refills | Status: DC | PRN

## 2020-03-12 NOTE — Progress Notes (Signed)
Refiledl pain medication decrease dose to Percocet 7.5/325 -Dr Chauncey Cruel

## 2020-03-12 NOTE — Telephone Encounter (Signed)
Refill sent to pharmacy.   

## 2020-03-12 NOTE — Telephone Encounter (Signed)
Pt called and LVM stating/requesing a RF on her pain medication

## 2020-03-15 ENCOUNTER — Other Ambulatory Visit: Payer: Self-pay

## 2020-03-15 ENCOUNTER — Telehealth: Payer: Self-pay | Admitting: Physician Assistant

## 2020-03-15 DIAGNOSIS — Z0000 Encounter for general adult medical examination without abnormal findings: Secondary | ICD-10-CM

## 2020-03-15 DIAGNOSIS — R739 Hyperglycemia, unspecified: Secondary | ICD-10-CM

## 2020-03-15 DIAGNOSIS — E559 Vitamin D deficiency, unspecified: Secondary | ICD-10-CM

## 2020-03-15 DIAGNOSIS — E781 Pure hyperglyceridemia: Secondary | ICD-10-CM

## 2020-03-15 DIAGNOSIS — E039 Hypothyroidism, unspecified: Secondary | ICD-10-CM

## 2020-03-15 NOTE — Telephone Encounter (Signed)
Patient called states she wanted to schedule her annual CPE & FBW ---Pt advised her  last AVS shows  :  Recommendations -Per the patient's preference return in 3 weeksper pt request- she would like to continuefollowing closely with [place].  -(Per Patient she thinks its past time for her CPE and wishes to do it w/ labs---forwarding note to med asst that there are No Lab orders in pt's chart.  --please contact pt if there are any questions or concerns @ [phone].  --glh

## 2020-03-17 ENCOUNTER — Telehealth: Payer: Self-pay

## 2020-03-17 ENCOUNTER — Other Ambulatory Visit: Payer: Self-pay | Admitting: Sports Medicine

## 2020-03-17 DIAGNOSIS — S86011D Strain of right Achilles tendon, subsequent encounter: Secondary | ICD-10-CM

## 2020-03-17 MED ORDER — OXYCODONE-ACETAMINOPHEN 7.5-325 MG PO TABS: 1.0000 | ORAL_TABLET | Freq: Four times a day (QID) | ORAL | 0 refills | Status: AC | PRN

## 2020-03-17 NOTE — Telephone Encounter (Signed)
Pt called requesting RF on pain medicaiotn

## 2020-03-17 NOTE — Telephone Encounter (Signed)
Refill sent.

## 2020-03-17 NOTE — Progress Notes (Signed)
Refilled pain medication

## 2020-03-19 ENCOUNTER — Ambulatory Visit: Payer: Medicare HMO | Admitting: Sports Medicine

## 2020-03-25 ENCOUNTER — Other Ambulatory Visit: Payer: Self-pay | Admitting: Sports Medicine

## 2020-03-25 ENCOUNTER — Telehealth: Payer: Self-pay

## 2020-03-25 MED ORDER — OXYCODONE-ACETAMINOPHEN 5-325 MG PO TABS: 1.0000 | ORAL_TABLET | Freq: Three times a day (TID) | ORAL | 0 refills | Status: DC | PRN

## 2020-03-25 NOTE — Telephone Encounter (Signed)
Pt called requesting RF on pain medication

## 2020-03-25 NOTE — Progress Notes (Signed)
Refilled pain medication. Recommend patient to follow up in office before I can continue with refills or refer her pain medication.  -Dr. Cannon Kettle

## 2020-03-30 ENCOUNTER — Other Ambulatory Visit: Payer: Self-pay | Admitting: Sports Medicine

## 2020-03-30 ENCOUNTER — Telehealth: Payer: Self-pay

## 2020-03-30 MED ORDER — OXYCODONE-ACETAMINOPHEN 5-325 MG PO TABS: 1.0000 | ORAL_TABLET | Freq: Three times a day (TID) | ORAL | 0 refills | Status: AC | PRN

## 2020-03-30 NOTE — Telephone Encounter (Signed)
Pt was informed of RF sent to [facility], pt was also informed that per Dr. Cannon Kettle this will be the last RF, that she needs a F/U or referral to pain management clinic

## 2020-03-30 NOTE — Telephone Encounter (Signed)
Refilled percocet this last time only. She will need a follow up for more refills or a referral to pain management if she feels like she will continue to need this medication -Dr. Chauncey Cruel

## 2020-03-30 NOTE — Telephone Encounter (Signed)
Pt called requesting a RF on her pain medication

## 2020-03-30 NOTE — Progress Notes (Signed)
Refilled percocet this last time only. She will need a follow up for more refills or a referral to pain management if she feels like she will continue to need this medication -Dr. Chauncey Cruel

## 2020-04-05 ENCOUNTER — Other Ambulatory Visit: Payer: Self-pay | Admitting: Family Medicine

## 2020-04-07 ENCOUNTER — Other Ambulatory Visit: Payer: Self-pay

## 2020-04-07 ENCOUNTER — Ambulatory Visit (INDEPENDENT_AMBULATORY_CARE_PROVIDER_SITE_OTHER): Payer: Medicare HMO | Admitting: Sports Medicine

## 2020-04-07 ENCOUNTER — Encounter: Payer: Self-pay | Admitting: Sports Medicine

## 2020-04-07 DIAGNOSIS — R2681 Unsteadiness on feet: Secondary | ICD-10-CM | POA: Diagnosis not present

## 2020-04-07 DIAGNOSIS — M25471 Effusion, right ankle: Secondary | ICD-10-CM

## 2020-04-07 DIAGNOSIS — S86011D Strain of right Achilles tendon, subsequent encounter: Secondary | ICD-10-CM | POA: Diagnosis not present

## 2020-04-07 DIAGNOSIS — F1120 Opioid dependence, uncomplicated: Secondary | ICD-10-CM

## 2020-04-07 DIAGNOSIS — M25571 Pain in right ankle and joints of right foot: Secondary | ICD-10-CM | POA: Diagnosis not present

## 2020-04-07 DIAGNOSIS — R29898 Other symptoms and signs involving the musculoskeletal system: Secondary | ICD-10-CM

## 2020-04-07 DIAGNOSIS — G8929 Other chronic pain: Secondary | ICD-10-CM | POA: Diagnosis not present

## 2020-04-07 MED ORDER — OXYCODONE-ACETAMINOPHEN 5-325 MG PO TABS: 1.0000 | ORAL_TABLET | Freq: Three times a day (TID) | ORAL | 0 refills | Status: AC | PRN
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
## 2020-04-13 ENCOUNTER — Telehealth: Payer: Self-pay | Admitting: Sports Medicine

## 2020-04-13 ENCOUNTER — Other Ambulatory Visit: Payer: Self-pay | Admitting: Sports Medicine

## 2020-04-13 ENCOUNTER — Telehealth: Payer: Self-pay

## 2020-04-13 MED ORDER — OXYCODONE-ACETAMINOPHEN 5-325 MG PO TABS: 1.0000 | ORAL_TABLET | Freq: Three times a day (TID) | ORAL | 0 refills | Status: AC | PRN

## 2020-04-13 NOTE — Telephone Encounter (Signed)
Pt called and stated she called and spoke to nurse earlier today wanting pain medication called in to a different pharmacy because she was staying with her sister but is no longer at that location and would like the pain medication called into her regular pharmacy [facility].

## 2020-04-13 NOTE — Progress Notes (Signed)
Refilled pain medication

## 2020-04-13 NOTE — Telephone Encounter (Signed)
Refill sent.

## 2020-04-13 NOTE — Telephone Encounter (Signed)
Pt called requesting a RF on pain medication sent to [facility] in [place]

## 2020-04-15 ENCOUNTER — Other Ambulatory Visit: Payer: Self-pay | Admitting: Family Medicine

## 2020-04-15 DIAGNOSIS — E039 Hypothyroidism, unspecified: Secondary | ICD-10-CM

## 2020-04-20 ENCOUNTER — Other Ambulatory Visit: Payer: Self-pay | Admitting: Sports Medicine

## 2020-04-20 ENCOUNTER — Telehealth: Payer: Self-pay

## 2020-04-20 MED ORDER — OXYCODONE-ACETAMINOPHEN 5-325 MG PO TABS: 1.0000 | ORAL_TABLET | Freq: Three times a day (TID) | ORAL | 0 refills | Status: AC | PRN

## 2020-04-20 NOTE — Progress Notes (Signed)
Refilled pain medication

## 2020-04-20 NOTE — Telephone Encounter (Signed)
Pt called and LVM requesting a RF on pain medication

## 2020-04-20 NOTE — Telephone Encounter (Signed)
Refill sent.

## 2020-04-23 ENCOUNTER — Telehealth: Payer: Self-pay | Admitting: Physician Assistant

## 2020-04-23 DIAGNOSIS — E039 Hypothyroidism, unspecified: Secondary | ICD-10-CM

## 2020-04-23 MED ORDER — ALBUTEROL SULFATE HFA 108 (90 BASE) MCG/ACT IN AERS: INHALATION_SPRAY | RESPIRATORY_TRACT | 0 refills | Status: DC

## 2020-04-23 MED ORDER — LEVOTHYROXINE SODIUM 125 MCG PO TABS: 125.0000 ug | ORAL_TABLET | Freq: Every day | ORAL | 0 refills | Status: DC

## 2020-04-23 NOTE — Telephone Encounter (Signed)
Patient is requesting a refill of her thyroid med and her albuterol inhaler. If approved please send to [facility]

## 2020-04-23 NOTE — Addendum Note (Signed)
Addended by: Fonnie Mu on: 04/23/2020 12:16 PM   Modules accepted: Orders

## 2020-04-23 NOTE — Telephone Encounter (Signed)
Please call pt to schedule appt.  No further refills until pt is seen.  T. Navdeep Fessenden, CMA  

## 2020-04-27 ENCOUNTER — Telehealth: Payer: Self-pay

## 2020-04-27 ENCOUNTER — Other Ambulatory Visit: Payer: Self-pay | Admitting: Sports Medicine

## 2020-04-27 ENCOUNTER — Other Ambulatory Visit: Payer: Self-pay | Admitting: Physician Assistant

## 2020-04-27 DIAGNOSIS — E781 Pure hyperglyceridemia: Secondary | ICD-10-CM

## 2020-04-27 DIAGNOSIS — E669 Obesity, unspecified: Secondary | ICD-10-CM

## 2020-04-27 DIAGNOSIS — Z0000 Encounter for general adult medical examination without abnormal findings: Secondary | ICD-10-CM

## 2020-04-27 MED ORDER — OXYCODONE-ACETAMINOPHEN 5-325 MG PO TABS: 1.0000 | ORAL_TABLET | Freq: Three times a day (TID) | ORAL | 0 refills | Status: AC | PRN

## 2020-04-27 NOTE — Telephone Encounter (Signed)
Pt called and LVM stating she went to pick up her pain medication to her pharmacy and they said she can't have them until the 17th. Pt states she already ran out of them since she is taking 2 a day every 8 hrs.

## 2020-04-27 NOTE — Progress Notes (Signed)
Refilled pain medication

## 2020-04-27 NOTE — Telephone Encounter (Signed)
She will have to take Motrin or tylenol instead until she can get them on the 17th. Those are the rules with pain medication. -Dr. Cannon Kettle

## 2020-04-27 NOTE — Telephone Encounter (Signed)
Pt states she had a confusion with Rx and it's another medication that she can't get until the 17th.  Pt states she does need a RF on pain medication sent to [facility]

## 2020-04-27 NOTE — Telephone Encounter (Signed)
Pt advised of Rx sent to pharmacy

## 2020-04-27 NOTE — Telephone Encounter (Signed)
Refill sent. I double checked and the last time I filled her pain medication was on 6/8 so she can have another refill. I have sent it to [facility]

## 2020-04-30 ENCOUNTER — Other Ambulatory Visit: Payer: Self-pay

## 2020-04-30 ENCOUNTER — Other Ambulatory Visit: Payer: Medicare HMO

## 2020-04-30 DIAGNOSIS — R739 Hyperglycemia, unspecified: Secondary | ICD-10-CM

## 2020-04-30 DIAGNOSIS — E669 Obesity, unspecified: Secondary | ICD-10-CM | POA: Diagnosis not present

## 2020-04-30 DIAGNOSIS — E781 Pure hyperglyceridemia: Secondary | ICD-10-CM | POA: Diagnosis not present

## 2020-04-30 DIAGNOSIS — E559 Vitamin D deficiency, unspecified: Secondary | ICD-10-CM | POA: Diagnosis not present

## 2020-04-30 DIAGNOSIS — Z0000 Encounter for general adult medical examination without abnormal findings: Secondary | ICD-10-CM | POA: Diagnosis not present

## 2020-04-30 DIAGNOSIS — E039 Hypothyroidism, unspecified: Secondary | ICD-10-CM | POA: Diagnosis not present

## 2020-05-01 LAB — CBC
Hematocrit: 43.5 % (ref 34.0–46.6)
Hemoglobin: 14.9 g/dL (ref 11.1–15.9)
MCH: 31.8 pg (ref 26.6–33.0)
MCHC: 34.3 g/dL (ref 31.5–35.7)
MCV: 93 fL (ref 79–97)
Platelets: 233 10*3/uL (ref 150–450)
RBC: 4.68 x10E6/uL (ref 3.77–5.28)
RDW: 13 % (ref 11.7–15.4)
WBC: 8.7 10*3/uL (ref 3.4–10.8)

## 2020-05-01 LAB — COMPREHENSIVE METABOLIC PANEL
ALT: 12 IU/L (ref 0–32)
AST: 11 IU/L (ref 0–40)
Albumin/Globulin Ratio: 1.7 (ref 1.2–2.2)
Albumin: 4 g/dL (ref 3.8–4.8)
Alkaline Phosphatase: 51 IU/L (ref 48–121)
BUN/Creatinine Ratio: 14 (ref 9–23)
BUN: 10 mg/dL (ref 6–24)
Bilirubin Total: 0.3 mg/dL (ref 0.0–1.2)
CO2: 24 mmol/L (ref 20–29)
Calcium: 9.5 mg/dL (ref 8.7–10.2)
Chloride: 104 mmol/L (ref 96–106)
Creatinine, Ser: 0.71 mg/dL (ref 0.57–1.00)
GFR calc Af Amer: 115 mL/min/{1.73_m2} (ref >59)
GFR calc non Af Amer: 100 mL/min/{1.73_m2} (ref >59)
Globulin, Total: 2.3 g/dL (ref 1.5–4.5)
Glucose: 101 mg/dL — ABNORMAL HIGH (ref 65–99)
Potassium: 4.8 mmol/L (ref 3.5–5.2)
Sodium: 141 mmol/L (ref 134–144)
Total Protein: 6.3 g/dL (ref 6.0–8.5)

## 2020-05-01 LAB — TSH: TSH: 0.294 u[IU]/mL — ABNORMAL LOW (ref 0.450–4.500)

## 2020-05-01 LAB — LIPID PANEL
Chol/HDL Ratio: 2.6 ratio (ref 0.0–4.4)
Cholesterol, Total: 160 mg/dL (ref 100–199)
HDL: 62 mg/dL (ref >39)
LDL Chol Calc (NIH): 73 mg/dL (ref 0–99)
Triglycerides: 148 mg/dL (ref 0–149)
VLDL Cholesterol Cal: 25 mg/dL (ref 5–40)

## 2020-05-01 LAB — HEMOGLOBIN A1C
Est. average glucose Bld gHb Est-mCnc: 97 mg/dL
Hgb A1c MFr Bld: 5 % (ref 4.8–5.6)

## 2020-05-01 LAB — VITAMIN D 25 HYDROXY (VIT D DEFICIENCY, FRACTURES): Vit D, 25-Hydroxy: 82 ng/mL (ref 30.0–100.0)

## 2020-05-01 LAB — T4, FREE: Free T4: 1.44 ng/dL (ref 0.82–1.77)

## 2020-05-01 LAB — T3: T3, Total: 71 ng/dL (ref 71–180)

## 2020-05-03 ENCOUNTER — Telehealth: Payer: Self-pay

## 2020-05-03 ENCOUNTER — Other Ambulatory Visit: Payer: Self-pay | Admitting: Sports Medicine

## 2020-05-03 MED ORDER — OXYCODONE-ACETAMINOPHEN 5-325 MG PO TABS: 1.0000 | ORAL_TABLET | Freq: Three times a day (TID) | ORAL | 0 refills | Status: AC | PRN

## 2020-05-03 NOTE — Telephone Encounter (Signed)
Refill sent.

## 2020-05-03 NOTE — Telephone Encounter (Signed)
Pt called and LVM requesting a RF on pain medication sent to [facility]

## 2020-05-03 NOTE — Progress Notes (Signed)
Refilled pain medication -Dr. S 

## 2020-05-04 ENCOUNTER — Encounter: Payer: Self-pay | Admitting: Physician Assistant

## 2020-05-04 ENCOUNTER — Other Ambulatory Visit: Payer: Self-pay

## 2020-05-04 ENCOUNTER — Ambulatory Visit (INDEPENDENT_AMBULATORY_CARE_PROVIDER_SITE_OTHER): Payer: Medicare HMO | Admitting: Physician Assistant

## 2020-05-04 ENCOUNTER — Other Ambulatory Visit: Payer: Self-pay | Admitting: Physician Assistant

## 2020-05-04 VITALS — BP 138/77 | HR 93 | Temp 98.0°F | Ht 64.0 in | Wt 165.9 lb

## 2020-05-04 DIAGNOSIS — L821 Other seborrheic keratosis: Secondary | ICD-10-CM | POA: Diagnosis not present

## 2020-05-04 DIAGNOSIS — Z0000 Encounter for general adult medical examination without abnormal findings: Secondary | ICD-10-CM | POA: Diagnosis not present

## 2020-05-04 DIAGNOSIS — F332 Major depressive disorder, recurrent severe without psychotic features: Secondary | ICD-10-CM | POA: Diagnosis not present

## 2020-05-04 DIAGNOSIS — E559 Vitamin D deficiency, unspecified: Secondary | ICD-10-CM | POA: Diagnosis not present

## 2020-05-04 DIAGNOSIS — Z1231 Encounter for screening mammogram for malignant neoplasm of breast: Secondary | ICD-10-CM

## 2020-05-04 DIAGNOSIS — E039 Hypothyroidism, unspecified: Secondary | ICD-10-CM

## 2020-05-04 DIAGNOSIS — Z23 Encounter for immunization: Secondary | ICD-10-CM

## 2020-05-04 DIAGNOSIS — Z1211 Encounter for screening for malignant neoplasm of colon: Secondary | ICD-10-CM

## 2020-05-04 DIAGNOSIS — R7989 Other specified abnormal findings of blood chemistry: Secondary | ICD-10-CM | POA: Insufficient documentation

## 2020-05-04 NOTE — Progress Notes (Signed)
Female Physical   Impression and Recommendations:    1. Healthcare maintenance   2. Severe episode of recurrent major depressive disorder, without psychotic features ([place])   3. Encounter for screening mammogram for malignant neoplasm of breast   4. Need for shingles vaccine   5. Adult hypothyroidism   6. Vitamin D deficiency   7. Screening for colon cancer   8. Seborrheic keratosis      1) Anticipatory Guidance: Discussed skin CA prevention and sunscreen when outside along with skin surveillance; eating a balanced and modest diet; physical activity at least 25 minutes per day or minimum of 150 min/ week moderate to intense activity.  2) Immunizations / Screenings / Labs:   All immunizations are up-to-date per recommendations or will be updated today if pt allows.    - Patient understands with dental and vision screens they will schedule independently.  - Obtained CBC, CMP, HgA1c, Lipid panel, TSH and vit D when fasting, if not already done past 12 mo/ recently. Most labs within normal limits, vitamin D at 82.0 so recommended to discontinue vitamin D 50,000 units and start over-the-counter vitamin D3 1000 units, TSH decreased at 0.294 with free T4 and T3 normal so plan to recheck TSH in 6 weeks. -Placed order for mammogram, Cologuard, and Shingrix vaccine. -UTD on Tdap, hep C and HIV screening.  3) Weight:  BMI meaning discussed with patient.  Discussed goal to improve diet habits to improve overall feelings of well being and objective health data. Improve nutrient density of diet through increasing intake of fruits and vegetables and decreasing saturated fats, white flour products and refined sugars.  4)Healthcare Maintenance: -Continue current medication regimen. -Continue to follow-up with psychiatry. Recommend grief counseling and encourage to speak to your pastor as you plan to.  -Encouraged to incorporate nonpharmacological therapy such as exercise and mindfulness  therapy. -Follow heart healthy diet follow. -Stay well hydrated, at least 64 fl oz -Follow-up in 3 months for regular office visit: Hypothyroid, mood, tobacco use -Follow-up in 6 weeks for lab visit only to recheck TSH.   No orders of the defined types were placed in this encounter.   Orders Placed This Encounter  Procedures  . Varicella-zoster vaccine IM  . Cologuard     Return in about 3 months (around 08/04/2020) for Mood, Hypothyroid; lab visit only to recheck TSH in 6 weeks.     Gross side effects, risk and benefits, and alternatives of medications discussed with patient.  Patient is aware that all medications have potential side effects and we are unable to predict every side effect or drug-drug interaction that may occur.  Expresses verbal understanding and consents to current therapy plan and treatment regimen.  F-up preventative CPE in 1 year- reminded pt again, this is in addition to any chronic care visits.    Please see orders placed and AVS handed out to patient at the end of our visit for further patient instructions/ counseling done pertaining to today's office visit.   Subjective:     CPE HPI: Ariel King is a 49 y.o. female who presents to [place] at [facility] today a yearly health maintenance exam.   Health Maintenance Summary  - Reviewed and updated, unless pt declines services.  Last Cologuard or Colonoscopy:  Placed order for Cologuard. Family history of Colon CA: No  Tobacco History Reviewed:  Y, former smoker but recently restarted due to death of her partner couple months ago CT scan for screening lung  CA:  N/A Alcohol and/or drug use:  No concerns; no use Female Health:  PAP Smear - last known results:  S/p hysterectomy STD concerns:  none Lumps or breast concerns: none Breast Cancer Family History:  No Bone/ DEXA scan:   Unnecessary due to < 65 and average risk       Immunization History  Administered Date(s)  Administered  . Tdap 06/15/2014, 01/28/2018  . Zoster Recombinat (Shingrix) 05/04/2020     Health Maintenance  Topic Date Due  . COVID-19 Vaccine (1) Never done  . MAMMOGRAM  Never done  . COLONOSCOPY  Never done  . INFLUENZA VACCINE  06/13/2020  . TETANUS/TDAP  01/29/2028  . Hepatitis C Screening  Completed  . HIV Screening  Completed  . PAP SMEAR-Modifier  Discontinued     Wt Readings from Last 3 Encounters:  05/04/20 165 lb 14.4 oz (75.3 kg)  08/13/19 190 lb (86.2 kg)  05/08/19 205 lb (93 kg)   BP Readings from Last 3 Encounters:  05/04/20 138/77  08/13/19 109/73  05/08/19 140/88   Pulse Readings from Last 3 Encounters:  05/04/20 93  08/13/19 87  05/08/19 95     Past Medical History:  Diagnosis Date  . Abnormal Pap smear   . Adult hypothyroidism 12/19/2015  . Anemia   . Anxiety   . Bladder disorder   . Chronic fatigue and immune dysfunction syndrome ([place])   . Chronic kidney disease   . Depression   . Depression   . EBV infection   . H/O bladder infections   . H/O joint problems   . Hx of migraines   . Incontinence   . Thyroid disease       Past Surgical History:  Procedure Laterality Date  . ROBOTIC ASSISTED LAP VAGINAL HYSTERECTOMY    . SYNOVECTOMY WRIST        Family History  Problem Relation Age of Onset  . Diabetes Mother   . Migraines Mother   . Heart attack Mother   . Depression Mother   . Hypertension Mother   . Alcohol abuse Father   . Cancer Father        prostate  . Depression Sister       Social History   Substance and Sexual Activity  Drug Use No  ,   Social History   Substance and Sexual Activity  Alcohol Use No  ,   Social History   Tobacco Use  Smoking Status Former Smoker  . Types: Cigarettes  Smokeless Tobacco Never Used  Tobacco Comment   quit 10 years ago  ,   Social History   Substance and Sexual Activity  Sexual Activity Yes  . Partners: Male  . Birth control/protection: Other-see comments    Comment: hyst    Current Outpatient Medications on File Prior to Visit  Medication Sig Dispense Refill  . albuterol (VENTOLIN HFA) 108 (90 Base) MCG/ACT inhaler INHALE 1 TO 2 PUFFS INTO THE LUNGS EVERY 4 (FOUR) HOURS AS NEEDED FOR WHEEZING OR SHORTNESS OF BREATH. 18 g 0  . ALPRAZolam (XANAX) 0.5 MG tablet Take 1-2 tablets by mouth every 6 (six) hours as needed.    Marland Kitchen amphetamine-dextroamphetamine (ADDERALL) 20 MG tablet Take 1 tablet by mouth 3 (three) times daily.    . celecoxib (CELEBREX) 200 MG capsule TAKE 1 CAPSULE TWICE DAILY 180 capsule 1  . diclofenac (FLECTOR) 1.3 % PTCH Place 1 patch onto the skin 2 (two) times daily. 60 patch 1  . diclofenac  Sodium (VOLTAREN) 1 % GEL Apply 4 g topically 4 (four) times daily. 150 g 2  . fluticasone (FLONASE) 50 MCG/ACT nasal spray USE 2 SPRAYS IN EACH NOSTRIL EVERY DAY 48 g 1  . gabapentin (NEURONTIN) 300 MG capsule Take 1 capsule (300 mg total) by mouth 4 (four) times daily. 90 capsule 3  . ibuprofen (ADVIL) 800 MG tablet Take 1 tablet (800 mg total) by mouth every 8 (eight) hours as needed. 30 tablet 0  . levothyroxine (SYNTHROID) 125 MCG tablet Take 1 tablet (125 mcg total) by mouth daily. 90 tablet 0  . methocarbamol (ROBAXIN) 500 MG tablet TAKE 1 TABLET (500 MG TOTAL) BY MOUTH NIGHTLY AS NEEDED  3  . montelukast (SINGULAIR) 10 MG tablet TAKE 1 TABLET (10 MG TOTAL) BY MOUTH AT BEDTIME. 90 tablet 1  . QUEtiapine (SEROQUEL) 100 MG tablet Take 100 mg by mouth as needed.    . traZODone (DESYREL) 100 MG tablet Take 3 tablets by mouth at bedtime.     Marland Kitchen venlafaxine XR (EFFEXOR-XR) 75 MG 24 hr capsule Take 3 capsules by mouth every morning.     No current facility-administered medications on file prior to visit.    Allergies: Naltrexone  Review of Systems: General:   Denies fever, chills, unexplained weight loss.  Optho/Auditory:   Denies visual changes, blurred vision/LOV Respiratory:   Denies SOB, DOE more than baseline levels.    Cardiovascular:   Denies chest pain, palpitations, new onset peripheral edema  Gastrointestinal:   Denies nausea, vomiting, diarrhea.  Genitourinary: Denies dysuria, freq/ urgency, flank pain or discharge from genitals.  Endocrine:     Denies hot or cold intolerance, polyuria, polydipsia. Musculoskeletal:   Denies unexplained myalgias, joint swelling, unexplained arthralgias, gait problems.  Skin:  Denies rash, suspicious lesions Neurological:     Denies dizziness, unexplained weakness, numbness  Psychiatric/Behavioral:   Denies suicidal or homicidal ideations, hallucinations    Objective:    Blood pressure 138/77, pulse 93, temperature 98 F (36.7 C), temperature source Oral, height 5\' 4"  (1.626 m), weight 165 lb 14.4 oz (75.3 kg), SpO2 98 %. Body mass index is 28.48 kg/m. General Appearance:    Alert, cooperative, no distress, appears stated age  Head:    Normocephalic, without obvious abnormality, atraumatic  Eyes:    PERRL, conjunctiva/corneas clear, EOM's intact,   Ears:    Cerumen impaction of both ears, visible TM's normal  Nose:   Nares normal, septum midline, mucosa normal, no drainage    or sinus tenderness  Throat:   Lips w/o lesion, mucosa moist, and tongue normal; teeth and   gums normal  Neck:   Supple, symmetrical, trachea midline, no adenopathy;    thyroid:  no enlargement/tenderness/nodules; no carotid   bruit or JVD  Back:     Symmetric, no curvature, ROM normal, no CVA tenderness  Lungs:     Clear to auscultation bilaterally, respirations unlabored, no Wh/ R/ R  Chest Wall:    No tenderness or gross deformity; normal excursion   Heart:    Regular rate and rhythm, S1 and S2 normal, no murmur, rub   or gallop  Breast Exam:    No tenderness, masses, or nipple abnormality b/l; no d/c; dense breast tissue noted  Abdomen:     Soft, non-tender, NBS, No G/R/R, no masses, no organomegaly  Genitalia:    Deferred.  Rectal:    Deferred.  Extremities:   Extremities normal,  atraumatic, no cyanosis or gross edema  Pulses:  Normal  Skin:   Warm, dry, Skin color, texture, turgor normal, no obvious rashes or lesions, SK noted Psych: No HI/SI, judgement and insight good, emotional during exam  Neurologic:   CNII-XII grossly intact, normal strength, good sensation and reflexes

## 2020-05-04 NOTE — Patient Instructions (Addendum)
Take Vitamin D3 1000 units after completing Vitamin D 50,000 units once every 2 weeks.    Preventive Care 13-50 Years Old, Female Preventive care refers to visits with your health care provider and lifestyle choices that can promote health and wellness. This includes:  A yearly physical exam. This may also be called an annual well check.  Regular dental visits and eye exams.  Immunizations.  Screening for certain conditions.  Healthy lifestyle choices, such as eating a healthy diet, getting regular exercise, not using drugs or products that contain nicotine and tobacco, and limiting alcohol use. What can I expect for my preventive care visit? Physical exam Your health care provider will check your:  Height and weight. This may be used to calculate body mass index (BMI), which tells if you are at a healthy weight.  Heart rate and blood pressure.  Skin for abnormal spots. Counseling Your health care provider may ask you questions about your:  Alcohol, tobacco, and drug use.  Emotional well-being.  Home and relationship well-being.  Sexual activity.  Eating habits.  Work and work Statistician.  Method of birth control.  Menstrual cycle.  Pregnancy history. What immunizations do I need?  Influenza (flu) vaccine  This is recommended every year. Tetanus, diphtheria, and pertussis (Tdap) vaccine  You may need a Td booster every 10 years. Varicella (chickenpox) vaccine  You may need this if you have not been vaccinated. Zoster (shingles) vaccine  You may need this after age 50. Measles, mumps, and rubella (MMR) vaccine  You may need at least one dose of MMR if you were born in 1957 or later. You may also need a second dose. Pneumococcal conjugate (PCV13) vaccine  You may need this if you have certain conditions and were not previously vaccinated. Pneumococcal polysaccharide (PPSV23) vaccine  You may need one or two doses if you smoke cigarettes or if you have  certain conditions. Meningococcal conjugate (MenACWY) vaccine  You may need this if you have certain conditions. Hepatitis A vaccine  You may need this if you have certain conditions or if you travel or work in places where you may be exposed to hepatitis A. Hepatitis B vaccine  You may need this if you have certain conditions or if you travel or work in places where you may be exposed to hepatitis B. Haemophilus influenzae type b (Hib) vaccine  You may need this if you have certain conditions. Human papillomavirus (HPV) vaccine  If recommended by your health care provider, you may need three doses over 6 months. You may receive vaccines as individual doses or as more than one vaccine together in one shot (combination vaccines). Talk with your health care provider about the risks and benefits of combination vaccines. What tests do I need? Blood tests  Lipid and cholesterol levels. These may be checked every 5 years, or more frequently if you are over 106 years old.  Hepatitis C test.  Hepatitis B test. Screening  Lung cancer screening. You may have this screening every year starting at age 7 if you have a 30-pack-year history of smoking and currently smoke or have quit within the past 15 years.  Colorectal cancer screening. All adults should have this screening starting at age 74 and continuing until age 20. Your health care provider may recommend screening at age 67 if you are at increased risk. You will have tests every 1-10 years, depending on your results and the type of screening test.  Diabetes screening. This is done by  checking your blood sugar (glucose) after you have not eaten for a while (fasting). You may have this done every 1-3 years.  Mammogram. This may be done every 1-2 years. Talk with your health care provider about when you should start having regular mammograms. This may depend on whether you have a family history of breast cancer.  BRCA-related cancer  screening. This may be done if you have a family history of breast, ovarian, tubal, or peritoneal cancers.  Pelvic exam and Pap test. This may be done every 3 years starting at age 79. Starting at age 50, this may be done every 5 years if you have a Pap test in combination with an HPV test. Other tests  Sexually transmitted disease (STD) testing.  Bone density scan. This is done to screen for osteoporosis. You may have this scan if you are at high risk for osteoporosis. Follow these instructions at home: Eating and drinking  Eat a diet that includes fresh fruits and vegetables, whole grains, lean protein, and low-fat dairy.  Take vitamin and mineral supplements as recommended by your health care provider.  Do not drink alcohol if: ? Your health care provider tells you not to drink. ? You are pregnant, may be pregnant, or are planning to become pregnant.  If you drink alcohol: ? Limit how much you have to 0-1 drink a day. ? Be aware of how much alcohol is in your drink. In the [place] one drink equals one 12 oz bottle of beer (355 mL), one 5 oz glass of wine (148 mL), or one 1 oz glass of hard liquor (44 mL). Lifestyle  Take daily care of your teeth and gums.  Stay active. Exercise for at least 30 minutes on 5 or more days each week.  Do not use any products that contain nicotine or tobacco, such as cigarettes, e-cigarettes, and chewing tobacco. If you need help quitting, ask your health care provider.  If you are sexually active, practice safe sex. Use a condom or other form of birth control (contraception) in order to prevent pregnancy and STIs (sexually transmitted infections).  If told by your health care provider, take low-dose aspirin daily starting at age 20. What's next?  Visit your health care provider once a year for a well check visit.  Ask your health care provider how often you should have your eyes and teeth checked.  Stay up to date on all vaccines. This  information is not intended to replace advice given to you by your health care provider. Make sure you discuss any questions you have with your health care provider. Document Revised: 07/11/2018 Document Reviewed: 07/11/2018 Elsevier Patient Education  [place] Loss, Adult People experience loss in many different ways throughout their lives. Events such as moving, changing jobs, and losing friends can create a sense of loss. The loss may be as serious as a major health change, divorce, death of a pet, or death of a loved one. All of these types of loss are likely to create a physical and emotional reaction known as grief. Grief is the result of a major change or an absence of something or someone that you count on. Grief is a normal reaction to loss. A variety of factors can affect your grieving experience, including:  The nature of your loss.  Your relationship to what or whom you lost.  Your understanding of grief and how to manage it.  Your support system. How to manage lifestyle changes  Keep to your normal routine as much as possible.  If you have trouble focusing or doing normal activities, it is acceptable to take some time away from your normal routine.  Spend time with friends and loved ones.  Eat a healthy diet, get plenty of sleep, and rest when you feel tired. How to recognize changes  The way that you deal with your grief will affect your ability to function as you normally do. When grieving, you may experience these changes:  Numbness, shock, sadness, anxiety, anger, denial, and guilt.  Thoughts about death.  Unexpected crying.  A physical sensation of emptiness in your stomach.  Problems sleeping and eating.  Tiredness (fatigue).  Loss of interest in normal activities.  Dreaming about or imagining seeing the person who died.  A need to remember what or whom you lost.  Difficulty thinking about anything other than your loss for a period  of time.  Relief. If you have been expecting the loss for a while, you may feel a sense of relief when it happens. Follow these instructions at home:  Activity Express your feelings in healthy ways, such as:  Talking with others about your loss. It may be helpful to find others who have had a similar loss, such as a support group.  Writing down your feelings in a journal.  Doing physical activities to release stress and emotional energy.  Doing creative activities like painting, sculpting, or playing or listening to music.  Practicing resilience. This is the ability to recover and adjust after facing challenges. Reading some resources that encourage resilience may help you to learn ways to practice those behaviors. General instructions  Be patient with yourself and others. Allow the grieving process to happen, and remember that grieving takes time. ? It is likely that you may never feel completely done with some grief. You may find a way to move on while still cherishing memories and feelings about your loss. ? Accepting your loss is a process. It can take months or longer to adjust.  Keep all follow-up visits as told by your health care provider. This is important. Where to find support To get support for managing loss:  Ask your health care provider for help and recommendations, such as grief counseling or therapy.  Think about joining a support group for people who are managing a loss. Where to find more information You can find more information about managing loss from:  American Society of Clinical Oncology: www.cancer.net  American Psychological Association: TVStereos.ch Contact a health care provider if:  Your grief is extreme and keeps getting worse.  You have ongoing grief that does not improve.  Your body shows symptoms of grief, such as illness.  You feel depressed, anxious, or lonely. Get help right away if:  You have thoughts about hurting yourself or  others. If you ever feel like you may hurt yourself or others, or have thoughts about taking your own life, get help right away. You can go to your nearest emergency department or call:  Your local emergency services (911 in the [place]  A suicide crisis helpline, such as the [place] at 541-438-5521. This is open 24 hours a day. Summary  Grief is the result of a major change or an absence of someone or something that you count on. Grief is a normal reaction to loss.  The depth of grief and the period of recovery depend on the type of loss and your ability to adjust to the  change and process your feelings.  Processing grief requires patience and a willingness to accept your feelings and talk about your loss with people who are supportive.  It is important to find resources that work for you and to realize that people experience grief differently. There is not one grieving process that works for everyone in the same way.  Be aware that when grief becomes extreme, it can lead to more severe issues like isolation, depression, anxiety, or suicidal thoughts. Talk with your health care provider if you have any of these issues. This information is not intended to replace advice given to you by your health care provider. Make sure you discuss any questions you have with your health care provider. Document Revised: 01/03/2019 Document Reviewed: 03/15/2017 Elsevier Patient Education  [place].

## 2020-05-07 DIAGNOSIS — L821 Other seborrheic keratosis: Secondary | ICD-10-CM | POA: Diagnosis not present

## 2020-05-07 DIAGNOSIS — D2371 Other benign neoplasm of skin of right lower limb, including hip: Secondary | ICD-10-CM | POA: Diagnosis not present

## 2020-05-07 DIAGNOSIS — B078 Other viral warts: Secondary | ICD-10-CM | POA: Diagnosis not present

## 2020-05-07 DIAGNOSIS — D225 Melanocytic nevi of trunk: Secondary | ICD-10-CM | POA: Diagnosis not present

## 2020-05-07 DIAGNOSIS — L738 Other specified follicular disorders: Secondary | ICD-10-CM | POA: Diagnosis not present

## 2020-05-07 DIAGNOSIS — L814 Other melanin hyperpigmentation: Secondary | ICD-10-CM | POA: Diagnosis not present

## 2020-05-10 ENCOUNTER — Other Ambulatory Visit: Payer: Self-pay | Admitting: Sports Medicine

## 2020-05-10 ENCOUNTER — Telehealth: Payer: Self-pay

## 2020-05-10 MED ORDER — OXYCODONE-ACETAMINOPHEN 5-325 MG PO TABS: 1.0000 | ORAL_TABLET | Freq: Three times a day (TID) | ORAL | 0 refills | Status: AC | PRN

## 2020-05-10 NOTE — Progress Notes (Signed)
Refilled percocet for pain

## 2020-05-10 NOTE — Telephone Encounter (Signed)
Pt called requesting a RF on pain medication. Please advice

## 2020-05-10 NOTE — Telephone Encounter (Signed)
Refill sent.

## 2020-05-14 ENCOUNTER — Ambulatory Visit: Payer: Medicare HMO | Admitting: Sports Medicine

## 2020-05-21 ENCOUNTER — Telehealth: Payer: Self-pay | Admitting: Sports Medicine

## 2020-05-21 ENCOUNTER — Ambulatory Visit: Payer: Medicare HMO

## 2020-05-21 NOTE — Telephone Encounter (Signed)
Pt states she is having burning pain from an achilles injury and is basically having to learn to walk again and Dr. Cannon Kettle gives her Oxycodone for the pain.

## 2020-05-21 NOTE — Telephone Encounter (Signed)
Pt called requesting pain medication refill please assist

## 2020-05-22 MED ORDER — OXYCODONE-ACETAMINOPHEN 10-325 MG PO TABS: 1.0000 | ORAL_TABLET | Freq: Three times a day (TID) | ORAL | 0 refills | Status: DC | PRN

## 2020-05-22 NOTE — Telephone Encounter (Signed)
Done - thank you.

## 2020-05-22 NOTE — Addendum Note (Signed)
Addended by: Boneta Lucks on: 05/22/2020 07:58 AM   Modules accepted: Orders

## 2020-05-27 ENCOUNTER — Ambulatory Visit (INDEPENDENT_AMBULATORY_CARE_PROVIDER_SITE_OTHER): Payer: Medicare HMO | Admitting: Sports Medicine

## 2020-05-27 ENCOUNTER — Other Ambulatory Visit: Payer: Self-pay

## 2020-05-27 DIAGNOSIS — S86011D Strain of right Achilles tendon, subsequent encounter: Secondary | ICD-10-CM | POA: Diagnosis not present

## 2020-05-27 DIAGNOSIS — M25571 Pain in right ankle and joints of right foot: Secondary | ICD-10-CM | POA: Diagnosis not present

## 2020-05-27 DIAGNOSIS — R2681 Unsteadiness on feet: Secondary | ICD-10-CM | POA: Diagnosis not present

## 2020-05-27 DIAGNOSIS — F1120 Opioid dependence, uncomplicated: Secondary | ICD-10-CM

## 2020-05-27 DIAGNOSIS — G8929 Other chronic pain: Secondary | ICD-10-CM | POA: Diagnosis not present

## 2020-05-27 DIAGNOSIS — M25471 Effusion, right ankle: Secondary | ICD-10-CM | POA: Diagnosis not present

## 2020-05-27 DIAGNOSIS — R29898 Other symptoms and signs involving the musculoskeletal system: Secondary | ICD-10-CM

## 2020-05-27 MED ORDER — OXYCODONE-ACETAMINOPHEN 5-325 MG PO TABS: 1.0000 | ORAL_TABLET | Freq: Three times a day (TID) | ORAL | 0 refills | Status: AC | PRN

## 2020-05-28 MED ORDER — CELECOXIB 200 MG PO CAPS: ORAL_CAPSULE | ORAL | 1 refills | Status: DC

## 2020-05-28 MED ORDER — GABAPENTIN 300 MG PO CAPS: 300.0000 mg | ORAL_CAPSULE | Freq: Four times a day (QID) | ORAL | 3 refills | Status: DC

## 2020-05-28 MED ORDER — DICLOFENAC SODIUM 1 % EX GEL: 4.0000 g | Freq: Four times a day (QID) | CUTANEOUS | 2 refills | Status: DC

## 2020-05-28 NOTE — Progress Notes (Signed)
Subjective: Ariel King is a 49 y.o. female patient who returns to office for follow up evaluation of right heel pain. Patient reports that the pain is about the same on still hurt with numbness tingling and pain that is sharp and constant worse 8 out of 10 with swelling.  Patient denies any new trauma or injury.  Patient Active Problem List   Diagnosis Date Noted  . Abnormal TSH 05/04/2020  . Emotional crisis as acute reaction to exceptional (gross) stress 10/15/2019  . Post-trauma response 10/15/2019  . Environmental and seasonal allergies 04/22/2019  . Reactive airway disease with wheezing 05/21/2018  . Tobacco consumption 05/21/2018  . Tobacco abuse counseling 05/21/2018  . Achilles tendinitis of both lower extremities 05/21/2018  . Chronic right-sided low back pain with right-sided sciatica 05/22/2017  . History of chlamydia 03/22/2017  . Muscle strain 03/22/2017  . Lumbar spine strain, initial encounter 03/22/2017  . Severe episode of recurrent major depressive disorder, without psychotic features ([place]) 03/05/2017  . Encounter for wellness examination 10/25/2016  . Panic attacks 06/09/2016  . Status post hysterectomy, left ovaries, took cervix. 06/09/2016  . Vitamin D deficiency 06/09/2016  . Adult hypothyroidism 12/19/2015  . Obesity (BMI 30-39.9) 11/03/2013  . Chronic pain syndrome 12/25/2012  . h/o Blood glucose elevated 12/25/2012  . h/o Hypertriglyceridemia 12/25/2012  . Other insomnia 12/25/2012  . Chronic fatigue, unspecified 12/25/2012  . Migraine without aura and without status migrainosus, not intractable 12/25/2012  . Anxiety 03/14/2012  . Migraines 03/14/2012  . Depression 03/14/2012  . Chronic fatigue disorder 03/14/2012    Current Outpatient Medications on File Prior to Visit  Medication Sig Dispense Refill  . albuterol (VENTOLIN HFA) 108 (90 Base) MCG/ACT inhaler INHALE 1 TO 2 PUFFS INTO THE LUNGS EVERY 4 (FOUR) HOURS AS NEEDED FOR WHEEZING OR SHORTNESS  OF BREATH. 18 g 0  . ALPRAZolam (XANAX) 0.5 MG tablet Take 1-2 tablets by mouth every 6 (six) hours as needed.    Marland Kitchen amphetamine-dextroamphetamine (ADDERALL) 20 MG tablet Take 1 tablet by mouth 3 (three) times daily.    . celecoxib (CELEBREX) 200 MG capsule TAKE 1 CAPSULE TWICE DAILY 180 capsule 1  . diclofenac (FLECTOR) 1.3 % PTCH Place 1 patch onto the skin 2 (two) times daily. 60 patch 1  . diclofenac Sodium (VOLTAREN) 1 % GEL Apply 4 g topically 4 (four) times daily. 150 g 2  . fluticasone (FLONASE) 50 MCG/ACT nasal spray USE 2 SPRAYS IN EACH NOSTRIL EVERY DAY 48 g 1  . gabapentin (NEURONTIN) 300 MG capsule Take 1 capsule (300 mg total) by mouth 4 (four) times daily. 90 capsule 3  . ibuprofen (ADVIL) 800 MG tablet Take 1 tablet (800 mg total) by mouth every 8 (eight) hours as needed. 30 tablet 0  . levothyroxine (SYNTHROID) 125 MCG tablet Take 1 tablet (125 mcg total) by mouth daily. 90 tablet 0  . methocarbamol (ROBAXIN) 500 MG tablet TAKE 1 TABLET (500 MG TOTAL) BY MOUTH NIGHTLY AS NEEDED  3  . montelukast (SINGULAIR) 10 MG tablet TAKE 1 TABLET (10 MG TOTAL) BY MOUTH AT BEDTIME. 90 tablet 1  . QUEtiapine (SEROQUEL) 100 MG tablet Take 100 mg by mouth as needed.    . traZODone (DESYREL) 100 MG tablet Take 3 tablets by mouth at bedtime.     Marland Kitchen venlafaxine XR (EFFEXOR-XR) 75 MG 24 hr capsule Take 3 capsules by mouth every morning.     No current facility-administered medications on file prior to visit.  Allergies  Allergen Reactions  . Naltrexone Other (See Comments)    Panic attack    Objective:  General: Alert and oriented x3 in no acute distress  Dermatology: No open lesions bilateral lower extremities, no webspace macerations, new bruise noted to the dorsal aspect of the right foot, all nails x 10 are well manicured except right great toenail where it is severely thickened.  Vascular: Dorsalis Pedis and Posterior Tibial pedal pulses 1/4, Capillary Fill Time 3 seconds, + pedal hair  growth bilateral, Focal edema to right lower posterior heel, temperature gradient within normal limits.  Neurology: Johney Maine sensation intact via light touch bilateral.   Musculoskeletal: Mild tenderness with palpation at insertion of the Achilles on right with no obvious palpable dell appears to be completely filled in with scar like before however with swelling still, Thompson time appear to be negative, subjective pain at heel.  Muscle strength 4 out of 5 on right with guarding.   Assessment and Plan: Problem List Items Addressed This Visit    None    Visit Diagnoses    Achilles tendon tear, right, subsequent encounter    -  Primary   Gait instability       Weakness of right lower extremity       Ankle swelling, right       Chronic pain of right ankle       Relevant Medications   oxyCODONE-acetaminophen (PERCOCET) 5-325 MG tablet   Narcotic dependence, episodic use (HCC)          -Complete examination performed -Discussed with patient chronic pain and will refer her to pain management but for now we will refill her Percocet at a very low dose to have until she can see pain management doctor -Continue with Celexa for inflammation in between doses of pain medicine -Continue with diclofenac topical as needed to the area for pain -Continue with gabapentin as needed for pain and as prescribed by her psychiatrist -Continue with compression sleeve for edema control -Continue with rest ice elevation and heat as needed -Advised patient to limit activities to tolerance -And no additional charge mechanically debrided right hallux nail using a sterile nail nipper and advised use of Vicks VapoRub to help soften the thickened layers of right great toenail like before however patient still has not done -Patient to return to office 4 weeks or until she sees pain management for follow-up exam or sooner if condition worsens.  Landis Martins, DPM

## 2020-05-31 ENCOUNTER — Telehealth: Payer: Self-pay | Admitting: *Deleted

## 2020-05-31 DIAGNOSIS — R29898 Other symptoms and signs involving the musculoskeletal system: Secondary | ICD-10-CM

## 2020-05-31 DIAGNOSIS — R2681 Unsteadiness on feet: Secondary | ICD-10-CM

## 2020-05-31 DIAGNOSIS — M25571 Pain in right ankle and joints of right foot: Secondary | ICD-10-CM

## 2020-05-31 DIAGNOSIS — G8929 Other chronic pain: Secondary | ICD-10-CM

## 2020-05-31 DIAGNOSIS — S86011D Strain of right Achilles tendon, subsequent encounter: Secondary | ICD-10-CM

## 2020-05-31 NOTE — Telephone Encounter (Signed)
Faxed required form, SnapShot with LOV and demographics to [facility].

## 2020-05-31 NOTE — Telephone Encounter (Signed)
-----   Message from [place], [place] sent at 05/28/2020 12:06 AM EDT ----- Regarding: Pain management Pain management history of Achilles tear chronic pain has been over 9 months since her tear with still pain has tapered down on pain medicine however patient still requests medication due to symptoms Patient prefers to go to [facility]

## 2020-06-01 ENCOUNTER — Telehealth: Payer: Self-pay | Admitting: Sports Medicine

## 2020-06-01 NOTE — Telephone Encounter (Signed)
Ariel King called  stating that they do not accept pts insurance

## 2020-06-04 ENCOUNTER — Other Ambulatory Visit: Payer: Self-pay | Admitting: Physician Assistant

## 2020-06-04 ENCOUNTER — Ambulatory Visit: Payer: Medicare HMO | Admitting: Sports Medicine

## 2020-06-04 ENCOUNTER — Other Ambulatory Visit: Payer: Self-pay | Admitting: Sports Medicine

## 2020-06-04 ENCOUNTER — Telehealth: Payer: Self-pay | Admitting: Sports Medicine

## 2020-06-04 ENCOUNTER — Other Ambulatory Visit: Payer: Self-pay | Admitting: Family Medicine

## 2020-06-04 DIAGNOSIS — J3089 Other allergic rhinitis: Secondary | ICD-10-CM

## 2020-06-04 DIAGNOSIS — E039 Hypothyroidism, unspecified: Secondary | ICD-10-CM

## 2020-06-04 DIAGNOSIS — J4520 Mild intermittent asthma, uncomplicated: Secondary | ICD-10-CM

## 2020-06-04 MED ORDER — CELECOXIB 200 MG PO CAPS: ORAL_CAPSULE | ORAL | 1 refills | Status: DC

## 2020-06-04 MED ORDER — OXYCODONE-ACETAMINOPHEN 5-325 MG PO TABS: 1.0000 | ORAL_TABLET | Freq: Four times a day (QID) | ORAL | 0 refills | Status: AC | PRN

## 2020-06-04 NOTE — Progress Notes (Signed)
Refilled meds until she can get to pain management

## 2020-06-04 NOTE — Telephone Encounter (Signed)
Pt requesting refill on pain medicine and anti inflammatory

## 2020-06-04 NOTE — Telephone Encounter (Signed)
Done

## 2020-06-10 ENCOUNTER — Telehealth: Payer: Self-pay

## 2020-06-10 ENCOUNTER — Other Ambulatory Visit: Payer: Self-pay | Admitting: Sports Medicine

## 2020-06-10 MED ORDER — OXYCODONE-ACETAMINOPHEN 5-325 MG PO TABS: 1.0000 | ORAL_TABLET | Freq: Three times a day (TID) | ORAL | 0 refills | Status: AC | PRN

## 2020-06-10 NOTE — Progress Notes (Signed)
Refilled pain medicaiton  

## 2020-06-10 NOTE — Telephone Encounter (Signed)
Pt called requesting a RF on pain medication  Sent to [facility]

## 2020-06-10 NOTE — Telephone Encounter (Signed)
Sent!

## 2020-06-15 ENCOUNTER — Other Ambulatory Visit: Payer: Self-pay

## 2020-06-15 ENCOUNTER — Other Ambulatory Visit: Payer: Medicare HMO

## 2020-06-15 DIAGNOSIS — E039 Hypothyroidism, unspecified: Secondary | ICD-10-CM

## 2020-06-16 LAB — TSH: TSH: 0.29 u[IU]/mL — ABNORMAL LOW (ref 0.450–4.500)

## 2020-06-17 ENCOUNTER — Telehealth: Payer: Self-pay | Admitting: Physician Assistant

## 2020-06-17 ENCOUNTER — Other Ambulatory Visit: Payer: Self-pay | Admitting: Sports Medicine

## 2020-06-17 NOTE — Telephone Encounter (Signed)
Requested labs ordered. AS, CMA

## 2020-06-17 NOTE — Telephone Encounter (Signed)
-----   Message from Lorrene Reid, [place] sent at 06/16/2020  6:18 PM EDT ----- Please call [facility] and see if you can add free T4 and T3.  Thank you, Herb Grays

## 2020-06-18 ENCOUNTER — Other Ambulatory Visit: Payer: Self-pay | Admitting: Sports Medicine

## 2020-06-18 ENCOUNTER — Telehealth: Payer: Self-pay

## 2020-06-18 ENCOUNTER — Telehealth: Payer: Self-pay | Admitting: Physician Assistant

## 2020-06-18 ENCOUNTER — Ambulatory Visit: Payer: Medicare HMO | Admitting: Sports Medicine

## 2020-06-18 DIAGNOSIS — E039 Hypothyroidism, unspecified: Secondary | ICD-10-CM

## 2020-06-18 MED ORDER — LEVOTHYROXINE SODIUM 112 MCG PO TABS: 112.0000 ug | ORAL_TABLET | Freq: Every day | ORAL | 1 refills | Status: DC

## 2020-06-18 MED ORDER — OXYCODONE-ACETAMINOPHEN 5-325 MG PO TABS: 1.0000 | ORAL_TABLET | Freq: Three times a day (TID) | ORAL | 0 refills | Status: AC | PRN

## 2020-06-18 NOTE — Progress Notes (Signed)
Pain med sent 

## 2020-06-18 NOTE — Telephone Encounter (Signed)
Refill sent.

## 2020-06-18 NOTE — Telephone Encounter (Signed)
Pt called and requesting pain med sent to [facility]

## 2020-06-18 NOTE — Telephone Encounter (Signed)
Sent new rx for Levothyroxine 112 mcg. Decreased dose from 125 mcg per most recent lab results.   Called patient to inform of dose adjustment and to disregard dose stated on mychart message sent. Entered in error for 137 mcg instead of 112 mcg. Pt verbalized understanding.  Lorrene Reid, PA-C

## 2020-06-21 ENCOUNTER — Telehealth: Payer: Self-pay

## 2020-06-21 ENCOUNTER — Telehealth: Payer: Self-pay | Admitting: *Deleted

## 2020-06-21 DIAGNOSIS — R2681 Unsteadiness on feet: Secondary | ICD-10-CM

## 2020-06-21 DIAGNOSIS — S86011D Strain of right Achilles tendon, subsequent encounter: Secondary | ICD-10-CM

## 2020-06-21 DIAGNOSIS — F1120 Opioid dependence, uncomplicated: Secondary | ICD-10-CM

## 2020-06-21 DIAGNOSIS — G8929 Other chronic pain: Secondary | ICD-10-CM

## 2020-06-21 NOTE — Telephone Encounter (Signed)
Called and LVM to pt stating the pain management clinic she was referred to is [facility]. Pt was advise she can contact them at [phone]

## 2020-06-21 NOTE — Telephone Encounter (Signed)
Faxed required form, clinical and demographics to [facility].

## 2020-06-22 ENCOUNTER — Ambulatory Visit: Payer: Medicare HMO | Admitting: Sports Medicine

## 2020-06-22 LAB — T3: T3, Total: 79 ng/dL (ref 71–180)

## 2020-06-22 LAB — T4, FREE: Free T4: 1.6 ng/dL (ref 0.82–1.77)

## 2020-06-22 LAB — SPECIMEN STATUS REPORT

## 2020-06-25 ENCOUNTER — Telehealth: Payer: Self-pay

## 2020-06-25 ENCOUNTER — Other Ambulatory Visit: Payer: Self-pay | Admitting: Sports Medicine

## 2020-06-25 MED ORDER — OXYCODONE-ACETAMINOPHEN 5-325 MG PO TABS: 1.0000 | ORAL_TABLET | Freq: Three times a day (TID) | ORAL | 0 refills | Status: DC | PRN

## 2020-06-25 NOTE — Telephone Encounter (Signed)
Pt wants RF fcor pain medication

## 2020-06-25 NOTE — Progress Notes (Signed)
Refilled pain medication until she can get in to see Pain management -Dr. Chauncey Cruel

## 2020-06-25 NOTE — Telephone Encounter (Signed)
sent 

## 2020-06-30 ENCOUNTER — Other Ambulatory Visit: Payer: Self-pay | Admitting: Sports Medicine

## 2020-06-30 MED ORDER — OXYCODONE-ACETAMINOPHEN 5-325 MG PO TABS: 1.0000 | ORAL_TABLET | Freq: Three times a day (TID) | ORAL | 0 refills | Status: DC | PRN

## 2020-06-30 NOTE — Progress Notes (Signed)
Refilled pain medication

## 2020-07-06 ENCOUNTER — Ambulatory Visit (INDEPENDENT_AMBULATORY_CARE_PROVIDER_SITE_OTHER): Payer: Medicare HMO | Admitting: Sports Medicine

## 2020-07-06 ENCOUNTER — Encounter: Payer: Self-pay | Admitting: Sports Medicine

## 2020-07-06 ENCOUNTER — Other Ambulatory Visit: Payer: Self-pay

## 2020-07-06 ENCOUNTER — Encounter: Payer: Self-pay | Admitting: Physical Medicine and Rehabilitation

## 2020-07-06 DIAGNOSIS — R2681 Unsteadiness on feet: Secondary | ICD-10-CM

## 2020-07-06 DIAGNOSIS — G8929 Other chronic pain: Secondary | ICD-10-CM

## 2020-07-06 DIAGNOSIS — M25471 Effusion, right ankle: Secondary | ICD-10-CM

## 2020-07-06 DIAGNOSIS — M25571 Pain in right ankle and joints of right foot: Secondary | ICD-10-CM | POA: Diagnosis not present

## 2020-07-06 DIAGNOSIS — L603 Nail dystrophy: Secondary | ICD-10-CM | POA: Diagnosis not present

## 2020-07-06 DIAGNOSIS — S86011D Strain of right Achilles tendon, subsequent encounter: Secondary | ICD-10-CM

## 2020-07-06 DIAGNOSIS — F1120 Opioid dependence, uncomplicated: Secondary | ICD-10-CM | POA: Diagnosis not present

## 2020-07-06 MED ORDER — PREDNISONE 10 MG (21) PO TBPK
ORAL_TABLET | ORAL | 0 refills | Status: DC
Start: 2020-07-06 — End: 2023-07-10

## 2020-07-06 MED ORDER — OXYCODONE-ACETAMINOPHEN 5-325 MG PO TABS: 1.0000 | ORAL_TABLET | Freq: Three times a day (TID) | ORAL | 0 refills | Status: DC | PRN

## 2020-07-06 NOTE — Progress Notes (Signed)
Subjective: Ariel King is a 49 y.o. female patient who returns to office for follow up evaluation of right heel pain. Patient reports that the pain is about the same on still hurt with an increase in swelling on today pain remains unchanged 8 out of 10.  Patient also requests to further discuss her pain medicine regimen and reports that she is filled for a very abruptly taken off of medication because of her past experience of withdrawal symptoms when she was on pain medicine for a hysterectomy.  Patient also requests for her nail to be trimmed this visit.  Patient denies any other pedal complaints at this time.  Patient Active Problem List   Diagnosis Date Noted  . Abnormal TSH 05/04/2020  . Emotional crisis as acute reaction to exceptional (gross) stress 10/15/2019  . Post-trauma response 10/15/2019  . Environmental and seasonal allergies 04/22/2019  . Reactive airway disease with wheezing 05/21/2018  . Tobacco consumption 05/21/2018  . Tobacco abuse counseling 05/21/2018  . Achilles tendinitis of both lower extremities 05/21/2018  . Chronic right-sided low back pain with right-sided sciatica 05/22/2017  . History of chlamydia 03/22/2017  . Muscle strain 03/22/2017  . Lumbar spine strain, initial encounter 03/22/2017  . Severe episode of recurrent major depressive disorder, without psychotic features ([place]) 03/05/2017  . Encounter for wellness examination 10/25/2016  . Panic attacks 06/09/2016  . Status post hysterectomy, left ovaries, took cervix. 06/09/2016  . Vitamin D deficiency 06/09/2016  . Adult hypothyroidism 12/19/2015  . Obesity (BMI 30-39.9) 11/03/2013  . Chronic pain syndrome 12/25/2012  . h/o Blood glucose elevated 12/25/2012  . h/o Hypertriglyceridemia 12/25/2012  . Other insomnia 12/25/2012  . Chronic fatigue, unspecified 12/25/2012  . Migraine without aura and without status migrainosus, not intractable 12/25/2012  . Anxiety 03/14/2012  . Migraines 03/14/2012  .  Depression 03/14/2012  . Chronic fatigue disorder 03/14/2012    Current Outpatient Medications on File Prior to Visit  Medication Sig Dispense Refill  . albuterol (VENTOLIN HFA) 108 (90 Base) MCG/ACT inhaler INHALE 1 TO 2 PUFFS INTO THE LUNGS EVERY 4 (FOUR) HOURS AS NEEDED FOR WHEEZING OR SHORTNESS OF BREATH 18 g 0  . ALPRAZolam (XANAX) 0.5 MG tablet Take 1-2 tablets by mouth every 6 (six) hours as needed.    Marland Kitchen amphetamine-dextroamphetamine (ADDERALL) 20 MG tablet Take 1 tablet by mouth 3 (three) times daily.    . celecoxib (CELEBREX) 200 MG capsule TAKE 1 CAPSULE TWICE DAILY 180 capsule 1  . diclofenac (FLECTOR) 1.3 % PTCH Place 1 patch onto the skin 2 (two) times daily. 60 patch 1  . diclofenac Sodium (VOLTAREN) 1 % GEL Apply 4 g topically 4 (four) times daily. 150 g 2  . fluticasone (FLONASE) 50 MCG/ACT nasal spray USE 2 SPRAYS IN EACH NOSTRIL EVERY DAY 48 g 1  . gabapentin (NEURONTIN) 300 MG capsule Take 1 capsule (300 mg total) by mouth 4 (four) times daily. 90 capsule 3  . ibuprofen (ADVIL) 800 MG tablet Take 1 tablet (800 mg total) by mouth every 8 (eight) hours as needed. 30 tablet 0  . levothyroxine (SYNTHROID) 112 MCG tablet Take 1 tablet (112 mcg total) by mouth daily. 30 tablet 1  . methocarbamol (ROBAXIN) 500 MG tablet TAKE 1 TABLET (500 MG TOTAL) BY MOUTH NIGHTLY AS NEEDED  3  . montelukast (SINGULAIR) 10 MG tablet TAKE 1 TABLET (10 MG TOTAL) BY MOUTH AT BEDTIME. 90 tablet 1  . QUEtiapine (SEROQUEL) 100 MG tablet Take 100 mg by mouth as  needed.    . traZODone (DESYREL) 100 MG tablet Take 3 tablets by mouth at bedtime.     Marland Kitchen venlafaxine XR (EFFEXOR-XR) 75 MG 24 hr capsule Take 3 capsules by mouth every morning.     No current facility-administered medications on file prior to visit.    Allergies  Allergen Reactions  . Naltrexone Other (See Comments)    Panic attack    Objective:  General: Alert and oriented x3 in no acute distress  Dermatology: No open lesions bilateral  lower extremities, no webspace macerations, all nails x 10 are well manicured except right great toenail where it is severely thickened.  Vascular: Dorsalis Pedis and Posterior Tibial pedal pulses 1/4, Capillary Fill Time 3 seconds, + pedal hair growth bilateral, localized focal edema to right lower posterior heel, temperature gradient within normal limits.  Neurology: Johney Maine sensation intact via light touch bilateral.   Musculoskeletal: Mild tenderness with palpation at insertion of the Achilles on right with no obvious palpable dell appears to be completely filled in with scar like before however with swelling still and of subjective pain, Thompson time appear to be negative.  Muscle strength 4 out of 5 on right with guarding.   Assessment and Plan: Problem List Items Addressed This Visit    None    Visit Diagnoses    Achilles tendon tear, right, subsequent encounter    -  Primary   Gait instability       Chronic pain of right ankle       Relevant Medications   predniSONE (STERAPRED UNI-PAK 21 TAB) 10 MG (21) TBPK tablet   oxyCODONE-acetaminophen (PERCOCET) 5-325 MG tablet (Start on 07/09/2020)   Narcotic dependence, episodic use (HCC)       Ankle swelling, right       Nail dystrophy          -Complete examination performed -Discussed with patient chronic pain; patient is awaiting to see pain management/PMR -Advised her that I will give a low-dose of Percocet until she can see pain management with the goal to taper her off likely next to Tylenol No. 3 for tramadol -Prescribed prednisone for increased swelling and pain at the right heel -Continue with Celexa for inflammation in between doses of pain medicine for additional relief after she has completed the prednisone -Continue with diclofenac topical as needed to the area for pain like previous -Continue with gabapentin as needed for pain and as prescribed by her psychiatrist and encourage patient to make a follow-up appointment to  discuss her stressors with the passing of her boyfriend -Continue with compression sleeve for edema control -Continue with rest ice elevation and heat as needed like previous  -Advised patient to limit activities to tolerance -And no additional charge mechanically debrided right hallux nail using a sterile nail nipper and advised use of Vicks VapoRub like previous -Patient to return to office 4 weeks or until she sees pain management for follow-up exam or sooner if condition worsens.  Landis Martins, DPM

## 2020-07-07 ENCOUNTER — Other Ambulatory Visit: Payer: Self-pay | Admitting: Physician Assistant

## 2020-07-07 DIAGNOSIS — E039 Hypothyroidism, unspecified: Secondary | ICD-10-CM

## 2020-07-12 ENCOUNTER — Telehealth: Payer: Self-pay | Admitting: Physician Assistant

## 2020-07-12 ENCOUNTER — Other Ambulatory Visit: Payer: Self-pay | Admitting: Physician Assistant

## 2020-07-12 MED ORDER — FLUTICASONE PROPIONATE 50 MCG/ACT NA SUSP
2.0000 | Freq: Every day | NASAL | 0 refills | Status: DC
Start: 2020-07-12 — End: 2021-01-28

## 2020-07-12 NOTE — Telephone Encounter (Signed)
Patient needs a refill on flonase. Please send to [facility]. Thanks

## 2020-07-12 NOTE — Addendum Note (Signed)
Addended by: Mickel Crow on: 07/12/2020 11:19 AM   Modules accepted: Orders

## 2020-07-12 NOTE — Telephone Encounter (Signed)
Refill sent to requested pharmacy. AS, CMA 

## 2020-07-14 ENCOUNTER — Telehealth: Payer: Self-pay | Admitting: Podiatry

## 2020-07-14 ENCOUNTER — Other Ambulatory Visit: Payer: Self-pay | Admitting: Sports Medicine

## 2020-07-14 MED ORDER — OXYCODONE-ACETAMINOPHEN 5-325 MG PO TABS
1.0000 | ORAL_TABLET | Freq: Three times a day (TID) | ORAL | 0 refills | Status: AC | PRN
Start: 2020-07-14 — End: 2020-07-19

## 2020-07-14 NOTE — Telephone Encounter (Signed)
Patient LVM requesting pain medication refill. Has a couple left, but does not want to run out.

## 2020-07-14 NOTE — Progress Notes (Signed)
Refilled pain medication

## 2020-07-26 ENCOUNTER — Other Ambulatory Visit: Payer: Self-pay | Admitting: Sports Medicine

## 2020-07-26 ENCOUNTER — Telehealth: Payer: Self-pay | Admitting: Urology

## 2020-07-26 MED ORDER — OXYCODONE-ACETAMINOPHEN 5-325 MG PO TABS
1.0000 | ORAL_TABLET | Freq: Three times a day (TID) | ORAL | 0 refills | Status: AC | PRN
Start: 2020-07-26 — End: 2020-07-31

## 2020-07-26 NOTE — Telephone Encounter (Signed)
Pt wants a refill on pain meds stats she is out. Please Advise, Thanks.

## 2020-07-26 NOTE — Progress Notes (Signed)
Refilled pain meds 

## 2020-07-26 NOTE — Telephone Encounter (Signed)
Refill sent.

## 2020-07-27 DIAGNOSIS — M79676 Pain in unspecified toe(s): Secondary | ICD-10-CM

## 2020-07-28 ENCOUNTER — Other Ambulatory Visit: Payer: Self-pay

## 2020-07-28 ENCOUNTER — Encounter: Payer: Self-pay | Admitting: Physician Assistant

## 2020-07-28 ENCOUNTER — Ambulatory Visit (INDEPENDENT_AMBULATORY_CARE_PROVIDER_SITE_OTHER): Payer: Medicare HMO | Admitting: Physician Assistant

## 2020-07-28 VITALS — BP 100/63 | HR 93 | Temp 98.3°F | Ht 64.0 in | Wt 162.2 lb

## 2020-07-28 DIAGNOSIS — E039 Hypothyroidism, unspecified: Secondary | ICD-10-CM

## 2020-07-28 DIAGNOSIS — Z716 Tobacco abuse counseling: Secondary | ICD-10-CM

## 2020-07-28 DIAGNOSIS — J4520 Mild intermittent asthma, uncomplicated: Secondary | ICD-10-CM | POA: Diagnosis not present

## 2020-07-28 DIAGNOSIS — J3089 Other allergic rhinitis: Secondary | ICD-10-CM

## 2020-07-28 DIAGNOSIS — F332 Major depressive disorder, recurrent severe without psychotic features: Secondary | ICD-10-CM

## 2020-07-28 MED ORDER — MONTELUKAST SODIUM 10 MG PO TABS: 10.0000 mg | ORAL_TABLET | Freq: Every day | ORAL | 1 refills | Status: DC

## 2020-07-28 NOTE — Progress Notes (Signed)
Established Patient Office Visit  Subjective:  Patient ID: Ariel King, female    DOB: 05/18/1971  Age: 49 y.o. MRN: [id number]  CC:  Chief Complaint  Patient presents with  . Medication Management    HPI Ericah Scotto Bornemann presents for follow-up on mood and hypothyroidism. 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  Mood: Pt is followed by Psychiatry. She has an upcoming appointment with her psychiatrist. Reports she started some meditation sessions which she has found helpful. She continues to grieve her significant other who passed away last year. States she does have resources she can utilize such as Present[employer], broadcasting for counseling.   Hypothyroid: Reports medication compliance. Asymptomatic.   Tobacco use: Pt reports she has started 4 mg nicotine patches that were given to her for free at the store she goes to purchase her cigarettes. States they have helped her to reduce her use from 15 cigarettes/day to 10 per day.     Past Medical History:  Diagnosis Date  . Abnormal Pap smear   . Adult hypothyroidism 12/19/2015  . Anemia   . Anxiety   . Bladder disorder   . Chronic fatigue and immune dysfunction syndrome ([place])   . Chronic kidney disease   . Depression   . Depression   . EBV infection   . H/O bladder infections   . H/O joint problems   . Hx of migraines   . Incontinence   . Thyroid disease     Past Surgical History:  Procedure Laterality Date  . ROBOTIC ASSISTED LAP VAGINAL HYSTERECTOMY    . SYNOVECTOMY WRIST      Family History  Problem Relation Age of Onset  . Diabetes Mother   . Migraines Mother   . Heart attack Mother   . Depression Mother   . Hypertension Mother   . Alcohol abuse Father   . Cancer Father        prostate  . Depression Sister     Social History   Socioeconomic History  . Marital status: Single    Spouse name: Not on file  . Number of children: Not on file  . Years of  education: Not on file  . Highest education level: Not on file  Occupational History  . Not on file  Tobacco Use  . Smoking status: Former Smoker    Types: Cigarettes  . Smokeless tobacco: Never Used  . Tobacco comment: quit 10 years ago  Vaping Use  . Vaping Use: Never used  Substance and Sexual Activity  . Alcohol use: No  . Drug use: No  . Sexual activity: Yes    Partners: Male    Birth control/protection: Other-see comments    Comment: hyst  Other Topics Concern  . Not on file  Social History Narrative  . Not on file   Social Determinants of Health   Financial Resource Strain:   . Difficulty of Paying Living Expenses: Not on file  Food Insecurity:   . Worried About Charity fundraiser in the Last Year: Not on file  . Ran Out of Food in the Last Year: Not on file  Transportation Needs:   . Lack of Transportation (Medical): Not on file  . Lack of Transportation (Non-Medical): Not on file  Physical Activity:   . Days of Exercise per Week: Not on file  . Minutes of Exercise per Session: Not on file  Stress:   .  Feeling of Stress : Not on file  Social Connections:   . Frequency of Communication with Friends and Family: Not on file  . Frequency of Social Gatherings with Friends and Family: Not on file  . Attends Religious Services: Not on file  . Active Member of Clubs or Organizations: Not on file  . Attends Archivist Meetings: Not on file  . Marital Status: Not on file  Intimate Partner Violence:   . Fear of Current or Ex-Partner: Not on file  . Emotionally Abused: Not on file  . Physically Abused: Not on file  . Sexually Abused: Not on file    Outpatient Medications Prior to Visit  Medication Sig Dispense Refill  . albuterol (VENTOLIN HFA) 108 (90 Base) MCG/ACT inhaler INHALE 1 TO 2 PUFFS INTO THE LUNGS EVERY 4 (FOUR) HOURS AS NEEDED FOR WHEEZING OR SHORTNESS OF BREATH 1 each 0  . ALPRAZolam (XANAX) 0.5 MG tablet Take 1-2 tablets by mouth every 6  (six) hours as needed.    Marland Kitchen amphetamine-dextroamphetamine (ADDERALL) 20 MG tablet Take 1 tablet by mouth 3 (three) times daily.    . celecoxib (CELEBREX) 200 MG capsule TAKE 1 CAPSULE TWICE DAILY 180 capsule 1  . diclofenac (FLECTOR) 1.3 % PTCH Place 1 patch onto the skin 2 (two) times daily. 60 patch 1  . diclofenac Sodium (VOLTAREN) 1 % GEL Apply 4 g topically 4 (four) times daily. 150 g 2  . fluticasone (FLONASE) 50 MCG/ACT nasal spray Place 2 sprays into both nostrils daily. 182 mL 0  . ibuprofen (ADVIL) 800 MG tablet Take 1 tablet (800 mg total) by mouth every 8 (eight) hours as needed. 30 tablet 0  . levothyroxine (SYNTHROID) 112 MCG tablet Take 1 tablet (112 mcg total) by mouth daily. 30 tablet 1  . methocarbamol (ROBAXIN) 500 MG tablet TAKE 1 TABLET (500 MG TOTAL) BY MOUTH NIGHTLY AS NEEDED  3  . oxyCODONE-acetaminophen (PERCOCET) 5-325 MG tablet Take 1 tablet by mouth every 8 (eight) hours as needed for up to 5 days for severe pain. 15 tablet 0  . predniSONE (STERAPRED UNI-PAK 21 TAB) 10 MG (21) TBPK tablet Take as directed 21 tablet 0  . QUEtiapine (SEROQUEL) 100 MG tablet Take 100 mg by mouth as needed.    . traZODone (DESYREL) 100 MG tablet Take 3 tablets by mouth at bedtime.     Marland Kitchen venlafaxine XR (EFFEXOR-XR) 75 MG 24 hr capsule Take 3 capsules by mouth every morning.    . gabapentin (NEURONTIN) 300 MG capsule Take 1 capsule (300 mg total) by mouth 4 (four) times daily. 90 capsule 3  . montelukast (SINGULAIR) 10 MG tablet TAKE 1 TABLET (10 MG TOTAL) BY MOUTH AT BEDTIME. 90 tablet 1   No facility-administered medications prior to visit.    Allergies  Allergen Reactions  . Naltrexone Other (See Comments)    Panic attack    ROS Review of Systems A fourteen system review of systems was performed and found to be positive as per HPI.  Objective:    Physical Exam General:  Well Developed, well nourished, appropriate for stated age.  Neuro:  Alert and oriented,  extra-ocular  muscles intact, no focal deficits  HEENT:  Normocephalic, atraumatic, neck supple Skin:  no gross rash, warm, pink. Cardiac:  RRR, S1 S2 Respiratory:  ECTA B/L and A/P, Not using accessory muscles, speaking in full sentences- unlabored. Vascular:  Ext warm, no cyanosis apprec.; cap RF less 2 sec. Psych:  No HI/SI, judgement  and insight good, mood- emotional. Full Affect.   BP 100/63   Pulse 93   Temp 98.3 F (36.8 C) (Oral)   Ht 5\' 4"  (1.626 m)   Wt 162 lb 4 oz (73.6 kg)   SpO2 96%   BMI 27.85 kg/m  Wt Readings from Last 3 Encounters:  07/28/20 162 lb 4 oz (73.6 kg)  05/04/20 165 lb 14.4 oz (75.3 kg)  08/13/19 190 lb (86.2 kg)     Health Maintenance Due  Topic Date Due  . COVID-19 Vaccine (1) Never done  . MAMMOGRAM  Never done  . COLONOSCOPY  Never done  . INFLUENZA VACCINE  Never done    There are no preventive care reminders to display for this patient.  Lab Results  Component Value Date   TSH 0.290 (L) 06/15/2020   Lab Results  Component Value Date   WBC 8.7 04/30/2020   HGB 14.9 04/30/2020   HCT 43.5 04/30/2020   MCV 93 04/30/2020   PLT 233 04/30/2020   Lab Results  Component Value Date   NA 141 04/30/2020   K 4.8 04/30/2020   CO2 24 04/30/2020   GLUCOSE 101 (H) 04/30/2020   BUN 10 04/30/2020   CREATININE 0.71 04/30/2020   BILITOT 0.3 04/30/2020   ALKPHOS 51 04/30/2020   AST 11 04/30/2020   ALT 12 04/30/2020   PROT 6.3 04/30/2020   ALBUMIN 4.0 04/30/2020   CALCIUM 9.5 04/30/2020   Lab Results  Component Value Date   CHOL 160 04/30/2020   Lab Results  Component Value Date   HDL 62 04/30/2020   Lab Results  Component Value Date   LDLCALC 73 04/30/2020   Lab Results  Component Value Date   TRIG 148 04/30/2020   Lab Results  Component Value Date   CHOLHDL 2.6 04/30/2020   Lab Results  Component Value Date   HGBA1C 5.0 04/30/2020      Assessment & Plan:   Problem List Items Addressed This Visit      Respiratory   Reactive  airway disease with wheezing   Relevant Medications   montelukast (SINGULAIR) 10 MG tablet     Endocrine   Adult hypothyroidism - Primary (Chronic)     Other   Severe episode of recurrent major depressive disorder, without psychotic features (HCC)   Tobacco abuse counseling   Environmental and seasonal allergies   Relevant Medications   montelukast (SINGULAIR) 10 MG tablet     Severe episode of recurrent major depressive disorder: -Follow up with Psychiatry as scheduled and discuss possible medication adjustments.  -Continue current medication regimen. -Encourage to join a bereavement support group. -Continue mediation sessions.  Hypothyroidism: -Last TSH 0.290 and medication was adjusted -Unable to collect TSH today so advised to return in 2 weeks for lab draw to recheck TSH. -Continue current medication regimen.   Tobacco abuse counseling: -Praised patient for reducing smoking use and encouraged to continue with reduction and eventually set a quit date. -Continue Nicotine patches and advised to notify me if would like a rx sent for Nicoderm. Pt prefers to continue with free samples available.  RAD, Environmental and seasonal allergies: -Continue current medication regimen. Provided requested refill. -Recommend to avoid seasonal/allergic triggers.   Of note: Patient recently completed prednisone taper and discussed steroid can possibly reduce the response to the vaccine so recommend getting vaccine in 2 weeks when she returns for blood work. Pt verbalized understanding. Also, patient is requesting medical records for deceased significant other who also  was a patient at [facility] and front office gave contact information for medical records department.    Meds ordered this encounter  Medications  . montelukast (SINGULAIR) 10 MG tablet    Sig: Take 1 tablet (10 mg total) by mouth at bedtime.    Dispense:  90 tablet    Refill:  1    Order Specific Question:   Supervising Provider     Answer:   Beatrice Lecher D [[id number]]    Follow-up: Return in about 4 months (around 11/27/2020) for Mood, Hypothyroid; lab visit  in 2 weeks for TSH and shingrix.   Note:  This note was prepared with assistance of Dragon voice recognition software. Occasional wrong-word or sound-a-like substitutions may have occurred due to the inherent limitations of voice recognition software.   Lorrene Reid, PA-C

## 2020-07-28 NOTE — Patient Instructions (Signed)
Managing Loss, Adult People experience loss in many different ways throughout their lives. Events such as moving, changing jobs, and losing friends can create a sense of loss. The loss may be as serious as a major health change, divorce, death of a pet, or death of a loved one. All of these types of loss are likely to create a physical and emotional reaction known as grief. Grief is the result of a major change or an absence of something or someone that you count on. Grief is a normal reaction to loss. A variety of factors can affect your grieving experience, including:  The nature of your loss.  Your relationship to what or whom you lost.  Your understanding of grief and how to manage it.  Your support system. How to manage lifestyle changes Keep to your normal routine as much as possible.  If you have trouble focusing or doing normal activities, it is acceptable to take some time away from your normal routine.  Spend time with friends and loved ones.  Eat a healthy diet, get plenty of sleep, and rest when you feel tired. How to recognize changes  The way that you deal with your grief will affect your ability to function as you normally do. When grieving, you may experience these changes:  Numbness, shock, sadness, anxiety, anger, denial, and guilt.  Thoughts about death.  Unexpected crying.  A physical sensation of emptiness in your stomach.  Problems sleeping and eating.  Tiredness (fatigue).  Loss of interest in normal activities.  Dreaming about or imagining seeing the person who died.  A need to remember what or whom you lost.  Difficulty thinking about anything other than your loss for a period of time.  Relief. If you have been expecting the loss for a while, you may feel a sense of relief when it happens. Follow these instructions at home:  Activity Express your feelings in healthy ways, such as:  Talking with others about your loss. It may be helpful to find  others who have had a similar loss, such as a support group.  Writing down your feelings in a journal.  Doing physical activities to release stress and emotional energy.  Doing creative activities like painting, sculpting, or playing or listening to music.  Practicing resilience. This is the ability to recover and adjust after facing challenges. Reading some resources that encourage resilience may help you to learn ways to practice those behaviors. General instructions  Be patient with yourself and others. Allow the grieving process to happen, and remember that grieving takes time. ? It is likely that you may never feel completely done with some grief. You may find a way to move on while still cherishing memories and feelings about your loss. ? Accepting your loss is a process. It can take months or longer to adjust.  Keep all follow-up visits as told by your health care provider. This is important. Where to find support To get support for managing loss:  Ask your health care provider for help and recommendations, such as grief counseling or therapy.  Think about joining a support group for people who are managing a loss. Where to find more information You can find more information about managing loss from:  American Society of Clinical Oncology: www.cancer.net  American Psychological Association: www.apa.org Contact a health care provider if:  Your grief is extreme and keeps getting worse.  You have ongoing grief that does not improve.  Your body shows symptoms of grief, such   as illness.  You feel depressed, anxious, or lonely. Get help right away if:  You have thoughts about hurting yourself or others. If you ever feel like you may hurt yourself or others, or have thoughts about taking your own life, get help right away. You can go to your nearest emergency department or call:  Your local emergency services (911 in the [place]  A suicide crisis helpline, such as the  National Suicide Prevention Lifeline at 1-800-273-8255. This is open 24 hours a day. Summary  Grief is the result of a major change or an absence of someone or something that you count on. Grief is a normal reaction to loss.  The depth of grief and the period of recovery depend on the type of loss and your ability to adjust to the change and process your feelings.  Processing grief requires patience and a willingness to accept your feelings and talk about your loss with people who are supportive.  It is important to find resources that work for you and to realize that people experience grief differently. There is not one grieving process that works for everyone in the same way.  Be aware that when grief becomes extreme, it can lead to more severe issues like isolation, depression, anxiety, or suicidal thoughts. Talk with your health care provider if you have any of these issues. This information is not intended to replace advice given to you by your health care provider. Make sure you discuss any questions you have with your health care provider. Document Revised: 01/03/2019 Document Reviewed: 03/15/2017 Elsevier Patient Education  2020 Elsevier Inc.  

## 2020-07-28 NOTE — Progress Notes (Signed)
Pt in for 2nd shingrix and had recent prednisone regimen and PA determined that they would wait for the 2nd dose.  One dose wasted due to having already drawn up. Garin Mata Zimmerman Rumple, CMA

## 2020-07-30 ENCOUNTER — Other Ambulatory Visit: Payer: Self-pay | Admitting: Sports Medicine

## 2020-08-01 NOTE — Telephone Encounter (Signed)
Please advise 

## 2020-08-03 ENCOUNTER — Other Ambulatory Visit: Payer: Self-pay | Admitting: Sports Medicine

## 2020-08-03 MED ORDER — OXYCODONE-ACETAMINOPHEN 5-325 MG PO TABS
1.0000 | ORAL_TABLET | Freq: Three times a day (TID) | ORAL | 0 refills | Status: AC | PRN
Start: 2020-08-03 — End: 2020-08-08

## 2020-08-03 NOTE — Progress Notes (Signed)
Refilled pain meds 

## 2020-08-05 ENCOUNTER — Encounter: Payer: Medicare HMO | Admitting: Physical Medicine and Rehabilitation

## 2020-08-10 ENCOUNTER — Other Ambulatory Visit: Payer: Self-pay | Admitting: Sports Medicine

## 2020-08-10 MED ORDER — OXYCODONE-ACETAMINOPHEN 5-325 MG PO TABS
1.0000 | ORAL_TABLET | Freq: Three times a day (TID) | ORAL | 0 refills | Status: AC | PRN
Start: 2020-08-10 — End: 2020-08-15

## 2020-08-10 NOTE — Progress Notes (Signed)
Refilled pain meds 

## 2020-08-16 ENCOUNTER — Other Ambulatory Visit: Payer: Self-pay | Admitting: Sports Medicine

## 2020-08-16 ENCOUNTER — Telehealth: Payer: Self-pay | Admitting: Sports Medicine

## 2020-08-16 MED ORDER — OXYCODONE-ACETAMINOPHEN 5-325 MG PO TABS
1.0000 | ORAL_TABLET | Freq: Three times a day (TID) | ORAL | 0 refills | Status: AC | PRN
Start: 2020-08-16 — End: 2020-08-21

## 2020-08-16 NOTE — Telephone Encounter (Signed)
I have re-sent them and Lattie Haw has spoken with patient already Thanks Dr. Chauncey Cruel

## 2020-08-16 NOTE — Progress Notes (Signed)
Refilled pain meds 

## 2020-08-16 NOTE — Telephone Encounter (Signed)
Pt states [facility] still does not have rx for pain meds-can you resend.

## 2020-08-24 ENCOUNTER — Other Ambulatory Visit: Payer: Self-pay | Admitting: Sports Medicine

## 2020-08-24 ENCOUNTER — Other Ambulatory Visit: Payer: Self-pay | Admitting: Physician Assistant

## 2020-08-24 DIAGNOSIS — E039 Hypothyroidism, unspecified: Secondary | ICD-10-CM

## 2020-08-24 MED ORDER — OXYCODONE-ACETAMINOPHEN 5-325 MG PO TABS
1.0000 | ORAL_TABLET | Freq: Three times a day (TID) | ORAL | 0 refills | Status: AC | PRN
Start: 2020-08-24 — End: 2020-08-29

## 2020-08-24 NOTE — Progress Notes (Signed)
Refilled pain medication until patient can go to Pain management -Dr. Cannon Kettle

## 2020-08-30 ENCOUNTER — Ambulatory Visit: Payer: Medicare HMO

## 2020-08-31 ENCOUNTER — Other Ambulatory Visit: Payer: Self-pay | Admitting: Sports Medicine

## 2020-08-31 ENCOUNTER — Telehealth: Payer: Self-pay | Admitting: *Deleted

## 2020-08-31 MED ORDER — OXYCODONE-ACETAMINOPHEN 5-325 MG PO TABS
1.0000 | ORAL_TABLET | Freq: Three times a day (TID) | ORAL | 0 refills | Status: AC | PRN
Start: 2020-08-31 — End: 2020-09-05

## 2020-08-31 NOTE — Telephone Encounter (Signed)
Called and spoke with the patient and relayed the message. Ariel King 

## 2020-08-31 NOTE — Telephone Encounter (Signed)
-----   Message from Landis Martins, [place] sent at 08/31/2020  7:49 AM EDT ----- Regarding: RE: Refill sent ----- Message ----- From: Viviana Simpler, PMAC Sent: 08/31/2020   7:44 AM EDT To: Landis Martins, DPM  Hey Dr Cannon Kettle, patient would like a refill of the pain medicine. Thanks Lattie Haw

## 2020-08-31 NOTE — Progress Notes (Signed)
Refilled pain medication, this is the last refill since patient is supposed to go to pain management, has an appt with [facility] on 10/27 -Dr. Chauncey Cruel

## 2020-09-08 ENCOUNTER — Other Ambulatory Visit: Payer: Self-pay | Admitting: Sports Medicine

## 2020-09-08 ENCOUNTER — Encounter: Payer: Medicare HMO | Attending: Physical Medicine and Rehabilitation | Admitting: Physical Medicine and Rehabilitation

## 2020-09-08 MED ORDER — OXYCODONE-ACETAMINOPHEN 2.5-325 MG PO TABS
1.0000 | ORAL_TABLET | Freq: Three times a day (TID) | ORAL | 0 refills | Status: DC | PRN
Start: 2020-09-08 — End: 2020-09-08

## 2020-09-08 MED ORDER — OXYCODONE-ACETAMINOPHEN 5-325 MG PO TABS: 1.0000 | ORAL_TABLET | Freq: Three times a day (TID) | ORAL | 0 refills | Status: AC | PRN

## 2020-09-08 NOTE — Progress Notes (Signed)
Sent 5/325 instead since 2.5/325 is not available

## 2020-09-08 NOTE — Progress Notes (Signed)
Refilled pain meds at lower dose to wean since patient was supposed to see pain mgt -Dr. Chauncey Cruel

## 2020-09-09 ENCOUNTER — Telehealth: Payer: Self-pay | Admitting: *Deleted

## 2020-09-09 NOTE — Telephone Encounter (Signed)
-----   Message from Ariel King, [place] sent at 09/08/2020  5:23 PM EDT ----- I changed the medication to 5/325 percocet instead  ----- Message ----- From: Viviana Simpler, PMAC Sent: 09/08/2020   4:49 PM EDT To: Ariel King, DPM  Pharmacy called and stated that the could not refill the 2.5/325 percocet because that don't carry that and the whole [facility] doesn't have it either and was calling to see what you would like to do-pharmacy number is [phone]. Lattie Haw

## 2020-09-09 NOTE — Telephone Encounter (Signed)
Called and spoke with the patient stating that I was calling just to make sure patient got the pain medicine that Dr Cannon Kettle sent in due to patient called and left a message stating she needed a refill. Ariel King

## 2020-09-12 ENCOUNTER — Other Ambulatory Visit: Payer: Self-pay | Admitting: Physician Assistant

## 2020-09-12 DIAGNOSIS — E039 Hypothyroidism, unspecified: Secondary | ICD-10-CM

## 2020-09-15 DIAGNOSIS — G43709 Chronic migraine without aura, not intractable, without status migrainosus: Secondary | ICD-10-CM | POA: Diagnosis not present

## 2020-09-17 ENCOUNTER — Other Ambulatory Visit: Payer: Self-pay | Admitting: Sports Medicine

## 2020-09-17 ENCOUNTER — Telehealth: Payer: Self-pay | Admitting: *Deleted

## 2020-09-17 MED ORDER — TRAMADOL HCL 50 MG PO TABS
50.0000 mg | ORAL_TABLET | Freq: Three times a day (TID) | ORAL | 0 refills | Status: AC | PRN
Start: 2020-09-17 — End: 2020-09-22

## 2020-09-17 NOTE — Progress Notes (Signed)
Sent tramadol for pain. She has pain management next week -Dr. Chauncey Cruel

## 2020-09-17 NOTE — Telephone Encounter (Signed)
Patient called and wanted to get a refill of the pain medicines and per Dr Cannon Kettle WILL NOT REFILL due to patient missed an appointment on 09/08/2020 with her pain management doctor and the pain management doctor informed Dr Cannon Kettle and I relayed the message to the patient that she can not have a refill of pain medicine until patient sees her pain management doctor and patient needs to call and reschedule with pain management. Lattie Haw

## 2020-09-21 ENCOUNTER — Encounter: Payer: Medicare HMO | Attending: Physical Medicine and Rehabilitation | Admitting: Physical Medicine and Rehabilitation

## 2020-09-21 ENCOUNTER — Encounter: Payer: Self-pay | Admitting: Physical Medicine and Rehabilitation

## 2020-09-21 ENCOUNTER — Other Ambulatory Visit: Payer: Self-pay

## 2020-09-21 VITALS — BP 134/85 | HR 91 | Temp 98.3°F | Ht 64.0 in | Wt 170.2 lb

## 2020-09-21 DIAGNOSIS — G894 Chronic pain syndrome: Secondary | ICD-10-CM | POA: Diagnosis not present

## 2020-09-21 DIAGNOSIS — M7918 Myalgia, other site: Secondary | ICD-10-CM | POA: Diagnosis not present

## 2020-09-21 DIAGNOSIS — Z5181 Encounter for therapeutic drug level monitoring: Secondary | ICD-10-CM | POA: Insufficient documentation

## 2020-09-21 DIAGNOSIS — Z79899 Other long term (current) drug therapy: Secondary | ICD-10-CM | POA: Insufficient documentation

## 2020-09-21 DIAGNOSIS — S86001A Unspecified injury of right Achilles tendon, initial encounter: Secondary | ICD-10-CM | POA: Diagnosis not present

## 2020-09-21 DIAGNOSIS — M532X8 Spinal instabilities, sacral and sacrococcygeal region: Secondary | ICD-10-CM | POA: Diagnosis not present

## 2020-09-21 NOTE — Addendum Note (Signed)
Addended by: Jasmine December T on: 09/21/2020 10:27 AM   Modules accepted: Orders

## 2020-09-21 NOTE — Patient Instructions (Signed)
-  Discussed following foods that may reduce pain: 1) Ginger 2) Blueberries 3) Salmon 4) Pumpkin seeds 5) dark chocolate 6) turmeric 7) tart cherries 8) virgin olive oil 9) chilli peppers 10) mint 11) red wine .   

## 2020-09-21 NOTE — Progress Notes (Signed)
Subjective:    Patient ID: Ariel King, female    DOB: [birth year], 49 y.o.   MRN: [id number]  HPI  Ariel King is a 49 year old woman who presents to establish care for Achilles tear.  Her dog dug a hole and she tripped in it and fell. This was one year ago. She was treated non-operatively. It was not torn badly enough to not have surgery.  She has been taking Percocet 5mg -325mg  twice per day. She also takes Celecoxib daily. She feels more unstable. She has done PT and did not feel benefit.   Average pains is 9/10, pain right now is 10/10.   Pain Inventory Average Pain 9 Pain Right Now 10 My pain is sharp, burning, stabbing, tingling and aching  In the last 24 hours, has pain interfered with the following? General activity 9 Relation with others 4 Enjoyment of life 2 What TIME of day is your pain at its worst? morning , daytime and evening Sleep (in general) Poor  Pain is worse with: walking, bending, sitting and standing Pain improves with: medication Relief from Meds: 6  use a cane how many minutes can you walk? a few ability to climb steps?  yes do you drive?  yes  disabled: date disabled . I need assistance with the following:  dressing, household duties and shopping  numbness tingling trouble walking spasms confusion depression anxiety  new pt  new pt    Family History  Problem Relation Age of Onset  . Diabetes Mother   . Migraines Mother   . Heart attack Mother   . Depression Mother   . Hypertension Mother   . Alcohol abuse Father   . Cancer Father        prostate  . Depression Sister    Social History   Socioeconomic History  . Marital status: Single    Spouse name: Not on file  . Number of children: Not on file  . Years of education: Not on file  . Highest education level: Not on file  Occupational History  . Not on file  Tobacco Use  . Smoking status: Former Smoker    Types: Cigarettes  . Smokeless tobacco: Never Used  .  Tobacco comment: quit 10 years ago  Vaping Use  . Vaping Use: Never used  Substance and Sexual Activity  . Alcohol use: No  . Drug use: No  . Sexual activity: Yes    Partners: Male    Birth control/protection: Other-see comments    Comment: hyst  Other Topics Concern  . Not on file  Social History Narrative  . Not on file   Social Determinants of Health   Financial Resource Strain:   . Difficulty of Paying Living Expenses: Not on file  Food Insecurity:   . Worried About Charity fundraiser in the Last Year: Not on file  . Ran Out of Food in the Last Year: Not on file  Transportation Needs:   . Lack of Transportation (Medical): Not on file  . Lack of Transportation (Non-Medical): Not on file  Physical Activity:   . Days of Exercise per Week: Not on file  . Minutes of Exercise per Session: Not on file  Stress:   . Feeling of Stress : Not on file  Social Connections:   . Frequency of Communication with Friends and Family: Not on file  . Frequency of Social Gatherings with Friends and Family: Not on file  . Attends Religious Services: Not  on file  . Active Member of Clubs or Organizations: Not on file  . Attends Archivist Meetings: Not on file  . Marital Status: Not on file   Past Surgical History:  Procedure Laterality Date  . ROBOTIC ASSISTED LAP VAGINAL HYSTERECTOMY    . SYNOVECTOMY WRIST     Past Medical History:  Diagnosis Date  . Abnormal Pap smear   . Adult hypothyroidism 12/19/2015  . Anemia   . Anxiety   . Bladder disorder   . Chronic fatigue and immune dysfunction syndrome ([place])   . Chronic kidney disease   . Depression   . Depression   . EBV infection   . H/O bladder infections   . H/O joint problems   . Hx of migraines   . Incontinence   . Thyroid disease    BP 134/85   Pulse 91   Temp 98.3 F (36.8 C)   Ht 5\' 4"  (1.626 m)   Wt 170 lb 3.2 oz (77.2 kg)   SpO2 95%   BMI 29.21 kg/m   Opioid Risk Score:   Fall Risk Score:   `1  Depression screen PHQ 2/9  Depression screen [facility] 2/9 07/28/2020 05/04/2020 11/05/2019 10/15/2019 08/13/2019 04/24/2019 04/22/2019  Decreased Interest 3 1 1 1  0 1 0  Down, Depressed, Hopeless 3 2 3 3 1 1  0  PHQ - 2 Score 6 3 4 4 1 2  0  Altered sleeping 3 0 3 1 1 1 1   Tired, decreased energy 2 0 1 0 0 0 0  Change in appetite 1 0 0 1 0 0 0  Feeling bad or failure about yourself  3 2 1  0 1 1 0  Trouble concentrating 3 1 3 1 1 1  0  Moving slowly or fidgety/restless 3 0 3 1 0 0 0  Suicidal thoughts 0 0 0 0 0 0 0  PHQ-9 Score 21 6 15 8 4 5 1   Difficult doing work/chores - Somewhat difficult Somewhat difficult Extremely dIfficult Somewhat difficult Somewhat difficult Not difficult at all  Some recent data might be hidden     Review of Systems  Musculoskeletal: Positive for back pain.       Shoulder pain Leg pain Feet pain  All other systems reviewed and are negative.      Objective:   Physical Exam Gen: no distress, normal appearing HEENT: oral mucosa pink and moist, NCAT Cardio: Reg rate Chest: normal effort, normal rate of breathing Abd: soft, non-distended Ext: no edema Skin: intact Neuro: Alert and oriented x3.  Musculoskeletal: tenderness to palpation at the back of the Achilles. Pain is worsened with dorsiflexion and plantar flexion. Tenderness to palpation over right SI joint.  Psych: pleasant, normal affect      Assessment & Plan:  Ariel King is a 49 year old woman who presents after an Achilles tendon tear 1 year ago.   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  -Discussed current symptoms of pain and history of pain.  -Discussed benefits of exercise in reducing pain. -Signed pain contract and obtained UDS.  -Discussed following foods that may reduce pain: 1) Ginger 2) Blueberries 3) Salmon 4) Pumpkin seeds 5) dark chocolate 6) turmeric 7) tart cherries 8) virgin olive oil 9) chilli peppers 10) mint 11) red wine   2) Right sided  SI joint injection -Will do trigger point injections next visit.

## 2020-09-23 DIAGNOSIS — M5116 Intervertebral disc disorders with radiculopathy, lumbar region: Secondary | ICD-10-CM | POA: Diagnosis not present

## 2020-09-23 DIAGNOSIS — M533 Sacrococcygeal disorders, not elsewhere classified: Secondary | ICD-10-CM | POA: Diagnosis not present

## 2020-09-25 LAB — TOXASSURE SELECT,+ANTIDEPR,UR

## 2020-10-18 ENCOUNTER — Telehealth: Payer: Self-pay | Admitting: Physician Assistant

## 2020-10-18 NOTE — Telephone Encounter (Signed)
Pt was supposed to have TSH recheck mid September and did not.  Advised pt that she needs OV to evaluate symptoms and recheck labs along with Shingrix #2.  Pt expressed understanding and is agreeable.  Pt was transferred to front desk to schedule appt.  Charyl Bigger, CMA

## 2020-10-18 NOTE — Telephone Encounter (Signed)
Patient left message on voicemail stating she is staying tired, shaky feeling and staying thirsty.  This has been going on for 2 to 3 weeks now.  Patient is also requesting shingles vaccine. Please advise patient of what she needs to do.  Please call her at [phone].

## 2020-10-24 ENCOUNTER — Other Ambulatory Visit: Payer: Self-pay | Admitting: Sports Medicine

## 2020-10-25 NOTE — Telephone Encounter (Signed)
Please advise 

## 2020-10-26 ENCOUNTER — Other Ambulatory Visit: Payer: Self-pay

## 2020-10-26 ENCOUNTER — Encounter: Payer: Self-pay | Admitting: Physical Medicine and Rehabilitation

## 2020-10-26 ENCOUNTER — Encounter: Payer: Medicare HMO | Attending: Physical Medicine and Rehabilitation | Admitting: Physical Medicine and Rehabilitation

## 2020-10-26 VITALS — BP 107/69 | HR 104 | Temp 98.6°F | Ht 63.5 in | Wt 170.0 lb

## 2020-10-26 DIAGNOSIS — M7918 Myalgia, other site: Secondary | ICD-10-CM

## 2020-10-26 DIAGNOSIS — M532X8 Spinal instabilities, sacral and sacrococcygeal region: Secondary | ICD-10-CM | POA: Insufficient documentation

## 2020-10-26 DIAGNOSIS — S86001A Unspecified injury of right Achilles tendon, initial encounter: Secondary | ICD-10-CM | POA: Insufficient documentation

## 2020-10-26 DIAGNOSIS — G894 Chronic pain syndrome: Secondary | ICD-10-CM | POA: Diagnosis not present

## 2020-10-26 MED ORDER — GABAPENTIN 400 MG PO CAPS: 400.0000 mg | ORAL_CAPSULE | Freq: Four times a day (QID) | ORAL | 0 refills | Status: DC

## 2020-10-26 NOTE — Patient Instructions (Signed)
Benefits of Ghee  -can be used in cooking, high smoke point  -high in fat soluble vitamins A, D, E, and K which are important for skin and vision, preventing leaky gut, strong bones  -free of lactose and casein  -contains conjugated linoleic acid, which can reduce body fat, prevent cancer, decrease inflammation, and lower blood pressure  -high in butyrate- helps support healthy insulin levels, decreases inflammation, decreases digestive problems, maintains healthy gut microbiome  -decreases pain and inflammation

## 2020-10-26 NOTE — Progress Notes (Signed)
Trigger Point Injection  Indication: Lumbar myofascial pain not relieved by medication management and other conservative care.  Informed consent was obtained after describing risk and benefits of the procedure with the patient, this includes bleeding, bruising, infection and medication side effects.  The patient wishes to proceed and has given written consent.  The patient was placed in a seated position.  The lumbar area was marked and prepped with Betadine.  It was entered with a 25-gauge 1/2 inch needle and a total of 5 mL of 1% lidocaine and normal saline was injected into a total of 4 trigger points, after negative draw back for blood.  The patient tolerated the procedure well.  Post procedure instructions were given. 

## 2020-10-27 ENCOUNTER — Ambulatory Visit: Payer: Self-pay | Admitting: Physical Medicine and Rehabilitation

## 2020-11-03 ENCOUNTER — Other Ambulatory Visit: Payer: Self-pay | Admitting: Sports Medicine

## 2020-11-03 NOTE — Telephone Encounter (Signed)
Please advise 

## 2020-11-09 ENCOUNTER — Telehealth: Payer: Self-pay | Admitting: *Deleted

## 2020-11-09 NOTE — Telephone Encounter (Signed)
Can you please set up phone visit for Thursday morning at 9:20am? Thank you!

## 2020-11-09 NOTE — Telephone Encounter (Signed)
Ariel King called to report that she is still in a lot of pain.  She would like a call from Dr Carlis Abbott to discuss and talk about some other type of therapy that was suggested.  Her number is [phone].

## 2020-11-10 ENCOUNTER — Telehealth: Payer: Self-pay | Admitting: Physician Assistant

## 2020-11-10 NOTE — Telephone Encounter (Signed)
asked next appointment/ verified per appt desk. wanted information to log in to My Chart. provided the customer service number. thank you

## 2020-11-11 ENCOUNTER — Encounter: Payer: Self-pay | Admitting: Physical Medicine and Rehabilitation

## 2020-11-11 ENCOUNTER — Other Ambulatory Visit: Payer: Self-pay

## 2020-11-11 ENCOUNTER — Encounter (HOSPITAL_BASED_OUTPATIENT_CLINIC_OR_DEPARTMENT_OTHER): Payer: Medicare HMO | Admitting: Physical Medicine and Rehabilitation

## 2020-11-11 DIAGNOSIS — S86001A Unspecified injury of right Achilles tendon, initial encounter: Secondary | ICD-10-CM | POA: Diagnosis not present

## 2020-11-11 DIAGNOSIS — M7918 Myalgia, other site: Secondary | ICD-10-CM | POA: Diagnosis not present

## 2020-11-11 DIAGNOSIS — M532X8 Spinal instabilities, sacral and sacrococcygeal region: Secondary | ICD-10-CM

## 2020-11-11 DIAGNOSIS — G894 Chronic pain syndrome: Secondary | ICD-10-CM

## 2020-11-11 MED ORDER — GABAPENTIN 600 MG PO TABS: 600.0000 mg | ORAL_TABLET | Freq: Four times a day (QID) | ORAL | 0 refills | Status: DC | PRN

## 2020-11-11 NOTE — Progress Notes (Signed)
Subjective:    Patient ID: Ariel King, female    DOB: 1971/06/09, 49 y.o.   MRN: [id number]  HPI  Due to national recommendations of social distancing because of COVID 44, an audio/video tele-health visit is felt to be the most appropriate encounter for this patient at this time. See MyChart message from today for the patient's consent to a tele-health encounter with [facility]. This is a follow up tele-visit via phone. The patient is at home. MD is at office.   Ariel King is a 49 year old woman who presents for follow-up of Achilles tear and low back pain.  We increased her Gabapentin to 4 pills per day and she does find that this helps with the pain. She is willing to increase further.   Tramadol was not helping much. Has tried Percocet in the past without much benefit. Continues to take Celecoxib BID without great benefit.  Trigger point injections did not provide benefit.  She would be interested in trying the stem cell patches we discussed last visit.   Prior history: Her dog dug a hole and she tripped in it and fell. This was one year ago. She was treated non-operatively. It was not torn badly enough to not have surgery.  She feels more unstable. She has done PT and did not feel benefit.   Average pains is 9/10, pain right now is 10/10.   Pain Inventory Average Pain 9 Pain Right Now 10 My pain is sharp, burning, stabbing, tingling and aching  In the last 24 hours, has pain interfered with the following? General activity 9 Relation with others 4 Enjoyment of life 2 What TIME of day is your pain at its worst? morning , daytime and evening Sleep (in general) Poor  Pain is worse with: walking, bending, sitting and standing Pain improves with: medication Relief from Meds: 6  use a cane how many minutes can you walk? a few ability to climb steps?  yes do you drive?  yes  disabled: date disabled . I need assistance with the  following:  dressing, household duties and shopping  numbness tingling trouble walking spasms confusion depression anxiety  new pt  new pt    Family History  Problem Relation Age of Onset  . Diabetes Mother   . Migraines Mother   . Heart attack Mother   . Depression Mother   . Hypertension Mother   . Alcohol abuse Father   . Cancer Father        prostate  . Depression Sister    Social History   Socioeconomic History  . Marital status: Single    Spouse name: Not on file  . Number of children: Not on file  . Years of education: Not on file  . Highest education level: Not on file  Occupational History  . Not on file  Tobacco Use  . Smoking status: Former Smoker    Types: Cigarettes  . Smokeless tobacco: Never Used  . Tobacco comment: quit 10 years ago  Vaping Use  . Vaping Use: Never used  Substance and Sexual Activity  . Alcohol use: No  . Drug use: No  . Sexual activity: Yes    Partners: Male    Birth control/protection: Other-see comments    Comment: hyst  Other Topics Concern  . Not on file  Social History Narrative  . Not on file   Social Determinants of Health   Financial Resource Strain: Not on file  Food Insecurity: Not on file  Transportation Needs: Not on file  Physical Activity: Not on file  Stress: Not on file  Social Connections: Not on file   Past Surgical History:  Procedure Laterality Date  . ROBOTIC ASSISTED LAP VAGINAL HYSTERECTOMY    . SYNOVECTOMY WRIST     Past Medical History:  Diagnosis Date  . Abnormal Pap smear   . Adult hypothyroidism 12/19/2015  . Anemia   . Anxiety   . Bladder disorder   . Chronic fatigue and immune dysfunction syndrome ([place])   . Chronic kidney disease   . Depression   . Depression   . EBV infection   . H/O bladder infections   . H/O joint problems   . Hx of migraines   . Incontinence   . Thyroid disease    There were no vitals taken for this visit.  Opioid Risk Score:   Fall Risk Score:   `1  Depression screen PHQ 2/9  Depression screen [facility] 2/9 09/21/2020 07/28/2020 05/04/2020 11/05/2019 10/15/2019 08/13/2019 04/24/2019  Decreased Interest 2 3 1 1 1  0 1  Down, Depressed, Hopeless 2 3 2 3 3 1 1   PHQ - 2 Score 4 6 3 4 4 1 2   Altered sleeping 0 3 0 3 1 1 1   Tired, decreased energy 1 2 0 1 0 0 0  Change in appetite 1 1 0 0 1 0 0  Feeling bad or failure about yourself  2 3 2 1  0 1 1  Trouble concentrating 1 3 1 3 1 1 1   Moving slowly or fidgety/restless 1 3 0 3 1 0 0  Suicidal thoughts 0 0 0 0 0 0 0  PHQ-9 Score 10 21 6 15 8 4 5   Difficult doing work/chores Very difficult - Somewhat difficult Somewhat difficult Extremely dIfficult Somewhat difficult Somewhat difficult  Some recent data might be hidden     Review of Systems  Musculoskeletal: Positive for back pain.       Shoulder pain Leg pain Feet pain  All other systems reviewed and are negative.      Objective:   Physical Exam Not performed as patient was seen via phone      Assessment & Plan:  Ariel King is a 49 year old woman who presents after an Achilles tendon tear 1 year ago, as well as with low back and hip pain.   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  -Discussed current symptoms of pain and history of pain.  -Discussed benefits of exercise in reducing pain. -Signed pain contract and obtained UDS previously but she is not currently on controlled substances. Did not have great relief from Tramadol in past. -Increase Gabapentin to 600mg  QID PRN -Consider stem cell patches, I will contact company to see if we can get her a free sample.  -Discussed following foods that may reduce pain: 1) Ginger 2) Blueberries 3) Salmon 4) Pumpkin seeds 5) dark chocolate 6) turmeric 7) tart cherries 8) virgin olive oil 9) chilli peppers 10) mint 11) red wine   2) Right sided SI joint pain -Trigger point injections did not help much, can consider adding marcaine if repeated.   20  minutes spent in review of chart, discussion of symptoms and prior interventions, discussion of increasing Gabapentin dose and stem cell patches, and contacting [facility] to see if we can get her a free sample.

## 2020-11-22 ENCOUNTER — Other Ambulatory Visit: Payer: Self-pay

## 2020-11-22 ENCOUNTER — Encounter: Payer: Self-pay | Admitting: Physician Assistant

## 2020-11-22 ENCOUNTER — Ambulatory Visit (INDEPENDENT_AMBULATORY_CARE_PROVIDER_SITE_OTHER): Payer: Medicare Other | Admitting: Physician Assistant

## 2020-11-22 VITALS — BP 110/71 | HR 97 | Ht 63.5 in | Wt 177.0 lb

## 2020-11-22 DIAGNOSIS — J3089 Other allergic rhinitis: Secondary | ICD-10-CM

## 2020-11-22 DIAGNOSIS — E039 Hypothyroidism, unspecified: Secondary | ICD-10-CM

## 2020-11-22 DIAGNOSIS — F332 Major depressive disorder, recurrent severe without psychotic features: Secondary | ICD-10-CM

## 2020-11-22 DIAGNOSIS — Z0000 Encounter for general adult medical examination without abnormal findings: Secondary | ICD-10-CM

## 2020-11-22 DIAGNOSIS — J4520 Mild intermittent asthma, uncomplicated: Secondary | ICD-10-CM | POA: Diagnosis not present

## 2020-11-22 MED ORDER — LEVOCETIRIZINE DIHYDROCHLORIDE 5 MG PO TABS
5.0000 mg | ORAL_TABLET | Freq: Every evening | ORAL | 0 refills | Status: DC
Start: 2020-11-22 — End: 2021-02-21

## 2020-11-22 NOTE — Progress Notes (Signed)
Established Patient Office Visit  Subjective:  Patient ID: Ariel King, female    DOB: [birth year]  Age: 50 y.o. MRN: [id number]  CC:  Chief Complaint  Patient presents with  . Thyroid Problem  . Depression    HPI Maleena Eddleman Cory presents for follow up on hypothyroid and mood management.   Hypothyroid: Lately has not been able to focus, has been more hungry and noticed weight gain. Denies fatigue, bowel changes or palpitations (has intermittent anxiety episodes which improves after taking Xanax). Patient is due for blood work. Medication was adjusted and failed to follow up for labs.   Mood: Followed by psychiatry. Feels like depression is a little better. Denies SI/HI.  RAD: Reports has been experiencing wheezing. Has been using albuterol inhaler several times/ wk and sometimes in the evenings. Has been taking Singulair at bedtime. Has not been taking antihistamine medication. Does report intermittent cough. Denies fever, chest pain, palpitations, dyspnea or chills.  Past Medical History:  Diagnosis Date  . Abnormal Pap smear   . Adult hypothyroidism 12/19/2015  . Anemia   . Anxiety   . Bladder disorder   . Chronic fatigue and immune dysfunction syndrome ([place])   . Chronic kidney disease   . Depression   . Depression   . EBV infection   . H/O bladder infections   . H/O joint problems   . Hx of migraines   . Incontinence   . Thyroid disease     Past Surgical History:  Procedure Laterality Date  . ROBOTIC ASSISTED LAP VAGINAL HYSTERECTOMY    . SYNOVECTOMY WRIST      Family History  Problem Relation Age of Onset  . Diabetes Mother   . Migraines Mother   . Heart attack Mother   . Depression Mother   . Hypertension Mother   . Alcohol abuse Father   . Cancer Father        prostate  . Depression Sister     Social History   Socioeconomic History  . Marital status: Single    Spouse name: Not on file  . Number of children: Not on file  . Years of education:  Not on file  . Highest education level: Not on file  Occupational History  . Not on file  Tobacco Use  . Smoking status: Former Smoker    Types: Cigarettes  . Smokeless tobacco: Never Used  . Tobacco comment: quit 10 years ago  Vaping Use  . Vaping Use: Never used  Substance and Sexual Activity  . Alcohol use: No  . Drug use: No  . Sexual activity: Yes    Partners: Male    Birth control/protection: Other-see comments    Comment: hyst  Other Topics Concern  . Not on file  Social History Narrative  . Not on file   Social Determinants of Health   Financial Resource Strain: Not on file  Food Insecurity: Not on file  Transportation Needs: Not on file  Physical Activity: Not on file  Stress: Not on file  Social Connections: Not on file  Intimate Partner Violence: Not on file    Outpatient Medications Prior to Visit  Medication Sig Dispense Refill  . albuterol (VENTOLIN HFA) 108 (90 Base) MCG/ACT inhaler INHALE 1 TO 2 PUFFS INTO THE LUNGS EVERY 4 (FOUR) HOURS AS NEEDED FOR WHEEZING OR SHORTNESS OF BREATH 1 each 0  . ALPRAZolam (XANAX) 0.5 MG tablet Take 1-2 tablets by mouth every 6 (six) hours as needed.    Marland Kitchen  amphetamine-dextroamphetamine (ADDERALL) 20 MG tablet Take 1 tablet by mouth 3 (three) times daily.    . celecoxib (CELEBREX) 200 MG capsule TAKE 1 CAPSULE TWICE DAILY 180 capsule 1  . fluticasone (FLONASE) 50 MCG/ACT nasal spray Place 2 sprays into both nostrils daily. 182 mL 0  . gabapentin (NEURONTIN) 600 MG tablet Take 1 tablet (600 mg total) by mouth 4 (four) times daily as needed. 120 tablet 0  . levothyroxine (SYNTHROID) 112 MCG tablet TAKE 1 TABLET (112 MCG TOTAL) BY MOUTH DAILY. 30 tablet 1  . methocarbamol (ROBAXIN) 500 MG tablet TAKE 1 TABLET (500 MG TOTAL) BY MOUTH NIGHTLY AS NEEDED  3  . montelukast (SINGULAIR) 10 MG tablet Take 1 tablet (10 mg total) by mouth at bedtime. 90 tablet 1  . QUEtiapine (SEROQUEL) 100 MG tablet Take 100 mg by mouth as needed.    .  traZODone (DESYREL) 100 MG tablet Take 3 tablets by mouth at bedtime.     Marland Kitchen venlafaxine XR (EFFEXOR-XR) 75 MG 24 hr capsule Take 3 capsules by mouth every morning.    . diclofenac (FLECTOR) 1.3 % PTCH Place 1 patch onto the skin 2 (two) times daily. (Patient not taking: Reported on 11/22/2020) 60 patch 1  . diclofenac Sodium (VOLTAREN) 1 % GEL Apply 4 g topically 4 (four) times daily. (Patient not taking: Reported on 11/22/2020) 150 g 2  . gabapentin (NEURONTIN) 400 MG capsule Take 1 capsule (400 mg total) by mouth in the morning, at noon, in the evening, and at bedtime. (Patient not taking: Reported on 11/22/2020) 120 capsule 0  . ibuprofen (ADVIL) 800 MG tablet Take 1 tablet (800 mg total) by mouth every 8 (eight) hours as needed. (Patient not taking: Reported on 11/22/2020) 30 tablet 0  . predniSONE (STERAPRED UNI-PAK 21 TAB) 10 MG (21) TBPK tablet Take as directed (Patient not taking: Reported on 11/22/2020) 21 tablet 0   No facility-administered medications prior to visit.    Allergies  Allergen Reactions  . Naltrexone Other (See Comments)    Panic attack    ROS Review of Systems A fourteen system review of systems was performed and found to be positive as per HPI.  Objective:    Physical Exam General:  Well Developed, well nourished, appropriate for stated age.  Neuro:  Alert and oriented,  extra-ocular muscles intact  HEENT:  Normocephalic, atraumatic, neck supple, no thyromegaly  Skin:  no gross rash, warm, pink. Cardiac:  RRR, S1 S2 Respiratory:  Scattered wheezing and rhonchi noted, Not using accessory muscles, speaking in full sentences- unlabored. Vascular:  Ext warm, no cyanosis apprec.; cap RF less 2 sec. Psych:  No HI/SI, judgement and insight good, Euthymic mood. Full Affect.   BP 110/71   Pulse 97   Ht 5' 3.5" (1.613 m)   Wt 177 lb (80.3 kg)   SpO2 98%   BMI 30.86 kg/m  Wt Readings from Last 3 Encounters:  11/22/20 177 lb (80.3 kg)  10/26/20 170 lb (77.1 kg)   09/21/20 170 lb 3.2 oz (77.2 kg)     Health Maintenance Due  Topic Date Due  . COVID-19 Vaccine (1) Never done  . COLONOSCOPY (Pts 45-13yr Insurance coverage will need to be confirmed)  Never done  . MAMMOGRAM  Never done  . INFLUENZA VACCINE  Never done    There are no preventive care reminders to display for this patient.  Lab Results  Component Value Date   TSH 0.290 (L) 06/15/2020   Lab Results  Component Value Date   WBC 8.7 04/30/2020   HGB 14.9 04/30/2020   HCT 43.5 04/30/2020   MCV 93 04/30/2020   PLT 233 04/30/2020   Lab Results  Component Value Date   NA 141 04/30/2020   K 4.8 04/30/2020   CO2 24 04/30/2020   GLUCOSE 101 (H) 04/30/2020   BUN 10 04/30/2020   CREATININE 0.71 04/30/2020   BILITOT 0.3 04/30/2020   ALKPHOS 51 04/30/2020   AST 11 04/30/2020   ALT 12 04/30/2020   PROT 6.3 04/30/2020   ALBUMIN 4.0 04/30/2020   CALCIUM 9.5 04/30/2020   Lab Results  Component Value Date   CHOL 160 04/30/2020   Lab Results  Component Value Date   HDL 62 04/30/2020   Lab Results  Component Value Date   LDLCALC 73 04/30/2020   Lab Results  Component Value Date   TRIG 148 04/30/2020   Lab Results  Component Value Date   CHOLHDL 2.6 04/30/2020   Lab Results  Component Value Date   HGBA1C 5.0 04/30/2020      Assessment & Plan:   Problem List Items Addressed This Visit      Respiratory   Reactive airway disease with wheezing     Endocrine   Adult hypothyroidism - Primary (Chronic)   Relevant Orders   TSH   T4, free   T3     Other   Severe episode of recurrent major depressive disorder, without psychotic features ([place])   Environmental and seasonal allergies   Relevant Medications   levocetirizine (XYZAL) 5 MG tablet    Other Visit Diagnoses    Healthcare maintenance       Relevant Orders   Comp Met (CMET)   CBC w/Diff     Adult hypothyroidism: -Last TSH 0.290, free T4 1.60, T3 79 -Medication dose was adjusted, will repeat  thyroid labs today. Pending results will adjust medication or send additional refills.  -Will continue to monitor.  RAD with wheezing, Environmental and seasonal allergies: -Will continue albuterol and singulair. Will start antihistamine- Xyzal. Discussed with patient if symptoms fail to improve or worsen then recommend medication adjustments by adding a long acting beta agonist with corticosteroid such as Symbicort. Patient verbalized understanding and will let me know via mychart or phone call. -Plan to obtain PFT next visit.  Severe episode of recurrent major depressive disorder, without psychotic features: -PHQ-9 score of 5, improved from prior (score of 10) and apparent on exam.  Denies SI/HI. -Continue current medication regimen. -Continue to follow-up with psychiatry.   Meds ordered this encounter  Medications  . levocetirizine (XYZAL) 5 MG tablet    Sig: Take 1 tablet (5 mg total) by mouth every evening.    Dispense:  90 tablet    Refill:  0    Order Specific Question:   Supervising Provider    Answer:   Beatrice Lecher D [[id number]]    Follow-up: Return in about 4 months (around 03/22/2021) for Mood, thyroid, RAD.   Note:  This note was prepared with assistance of Dragon voice recognition software. Occasional wrong-word or sound-a-like substitutions may have occurred due to the inherent limitations of voice recognition software.  Lorrene Reid, PA-C

## 2020-11-22 NOTE — Patient Instructions (Signed)
http://www.aaaai.org/conditions-and-treatments/asthma">  Asthma, Adult  Asthma is a long-term (chronic) condition that causes recurrent episodes in which the airways become tight and narrow. The airways are the passages that lead from the nose and mouth down into the lungs. Asthma episodes, also called asthma attacks, can cause coughing, wheezing, shortness of breath, and chest pain. The airways can also fill with mucus. During an attack, it can be difficult to breathe. Asthma attacks can range from minor to life threatening. Asthma cannot be cured, but medicines and lifestyle changes can help control it and treat acute attacks. What are the causes? This condition is believed to be caused by inherited (genetic) and environmental factors, but its exact cause is not known. There are many things that can bring on an asthma attack or make asthma symptoms worse (triggers). Asthma triggers are different for each person. Common triggers include:  Mold.  Dust.  Cigarette smoke.  Cockroaches.  Things that can cause allergy symptoms (allergens), such as animal dander or pollen from trees or grass.  Air pollutants such as household cleaners, wood smoke, smog, or chemical odors.  Cold air, weather changes, and winds (which increase molds and pollen in the air).  Strong emotional expressions such as crying or laughing hard.  Stress.  Certain medicines (such as aspirin) or types of medicines (such as beta-blockers).  Sulfites in foods and drinks. Foods and drinks that may contain sulfites include dried fruit, potato chips, and sparkling grape juice.  Infections or inflammatory conditions such as the flu, a cold, or inflammation of the nasal membranes (rhinitis).  Gastroesophageal reflux disease (GERD).  Exercise or strenuous activity. What are the signs or symptoms? Symptoms of this condition may occur right after asthma is triggered or many hours later. Symptoms include:  Wheezing. This can  sound like whistling when you breathe.  Excessive nighttime or early morning coughing.  Frequent or severe coughing with a common cold.  Chest tightness.  Shortness of breath.  Tiredness (fatigue) with minimal activity. How is this diagnosed? This condition is diagnosed based on:  Your medical history.  A physical exam.  Tests, which may include: ? Lung function studies and pulmonary studies (spirometry). These tests can evaluate the flow of air in your lungs. ? Allergy tests. ? Imaging tests, such as X-rays. How is this treated? There is no cure for this condition, but treatment can help control your symptoms. Treatment for asthma usually involves:  Identifying and avoiding your asthma triggers.  Using medicines to control your symptoms. Generally, two types of medicines are used to treat asthma: ? Controller medicines. These help prevent asthma symptoms from occurring. They are usually taken every day. ? Fast-acting reliever or rescue medicines. These quickly relieve asthma symptoms by widening the narrow and tight airways. They are used as needed and provide short-term relief.  Using supplemental oxygen. This may be needed during a severe episode.  Using other medicines, such as: ? Allergy medicines, such as antihistamines, if your asthma attacks are triggered by allergens. ? Immune medicines (immunomodulators). These are medicines that help control the immune system.  Creating an asthma action plan. An asthma action plan is a written plan for managing and treating your asthma attacks. This plan includes: ? A list of your asthma triggers and how to avoid them. ? Information about when medicines should be taken and when their dosage should be changed. ? Instructions about using a device called a peak flow meter. A peak flow meter measures how well the lungs are working   and the severity of your asthma. It helps you monitor your condition. Follow these instructions at  home: Controlling your home environment Control your home environment in the following ways to help avoid triggers and prevent asthma attacks:  Change your heating and air conditioning filter regularly.  Limit your use of fireplaces and wood stoves.  Get rid of pests (such as roaches and mice) and their droppings.  Throw away plants if you see mold on them.  Clean floors and dust surfaces regularly. Use unscented cleaning products.  Try to have someone else vacuum for you regularly. Stay out of rooms while they are being vacuumed and for a short while afterward. If you vacuum, use a dust mask from a hardware store, a double-layered or microfilter vacuum cleaner bag, or a vacuum cleaner with a HEPA filter.  Replace carpet with wood, tile, or vinyl flooring. Carpet can trap dander and dust.  Use allergy-proof pillows, mattress covers, and box spring covers.  Keep your bedroom a trigger-free room.  Avoid pets and keep windows closed when allergens are in the air.  Wash beddings every week in hot water and dry them in a dryer.  Use blankets that are made of polyester or cotton.  Clean bathrooms and kitchens with bleach. If possible, have someone repaint the walls in these rooms with mold-resistant paint. Stay out of the rooms that are being cleaned and painted.  Wash your hands often with soap and water. If soap and water are not available, use hand sanitizer.  Do not allow anyone to smoke in your home. General instructions  Take over-the-counter and prescription medicines only as told by your health care provider. ? Speak with your health care provider if you have questions about how or when to take the medicines. ? Make note if you are requiring more frequent dosages.  Do not use any products that contain nicotine or tobacco, such as cigarettes and e-cigarettes. If you need help quitting, ask your health care provider. Also, avoid being exposed to secondhand smoke.  Use a peak  flow meter as told by your health care provider. Record and keep track of the readings.  Understand and use the asthma action plan to help minimize, or stop an asthma attack, without needing to seek medical care.  Make sure you stay up to date on your yearly vaccinations as told by your health care provider. This may include vaccines for the flu and pneumonia.  Avoid outdoor activities when allergen counts are high and when air quality is low.  Wear a ski mask that covers your nose and mouth during outdoor winter activities. Exercise indoors on cold days if you can.  Warm up before exercising, and take time for a cool-down period after exercise.  Keep all follow-up visits as told by your health care provider. This is important. Where to find more information  For information about asthma, turn to the Centers for Disease Control and Prevention at http://www.clark.net/  For air quality information, turn to [employer] at https://www.miller-reyes.info/ Contact a health care provider if:  You have wheezing, shortness of breath, or a cough even while you are taking medicine to prevent attacks.  The mucus you cough up (sputum) is thicker than usual.  Your sputum changes from clear or white to yellow, green, gray, or bloody.  Your medicines are causing side effects, such as a rash, itching, swelling, or trouble breathing.  You need to use a reliever medicine more than 2-3 times a week.  Your peak  flow reading is still at 50-79% of your personal best after following your action plan for 1 hour.  You have a fever. Get help right away if:  You are getting worse and do not respond to treatment during an asthma attack.  You are short of breath when at rest or when doing very little physical activity.  You have difficulty eating, drinking, or talking.  You have chest pain or tightness.  You develop a fast heartbeat or palpitations.  You have a bluish color to your lips or fingernails.  You are  light-headed or dizzy, or you faint.  Your peak flow reading is less than 50% of your personal best.  You feel too tired to breathe normally. Summary  Asthma is a long-term (chronic) condition that causes recurrent episodes in which the airways become tight and narrow. These episodes can cause coughing, wheezing, shortness of breath, and chest pain.  Asthma cannot be cured, but medicines and lifestyle changes can help control it and treat acute attacks.  Make sure you understand how to avoid triggers and how and when to use your medicines.  Asthma attacks can range from minor to life threatening. Get help right away if you have an asthma attack and do not respond to treatment with your usual rescue medicines. This information is not intended to replace advice given to you by your health care provider. Make sure you discuss any questions you have with your health care provider. Document Revised: 07/30/2020 Document Reviewed: 03/03/2020 Elsevier Patient Education  2021 [employer].

## 2020-11-23 ENCOUNTER — Telehealth: Payer: Self-pay | Admitting: Physician Assistant

## 2020-11-23 DIAGNOSIS — E039 Hypothyroidism, unspecified: Secondary | ICD-10-CM

## 2020-11-23 LAB — COMPREHENSIVE METABOLIC PANEL
ALT: 9 IU/L (ref 0–32)
AST: 10 IU/L (ref 0–40)
Albumin/Globulin Ratio: 2.3 — ABNORMAL HIGH (ref 1.2–2.2)
Albumin: 4.5 g/dL (ref 3.8–4.8)
Alkaline Phosphatase: 58 IU/L (ref 44–121)
BUN/Creatinine Ratio: 19 (ref 9–23)
BUN: 13 mg/dL (ref 6–24)
Bilirubin Total: 0.2 mg/dL (ref 0.0–1.2)
CO2: 23 mmol/L (ref 20–29)
Calcium: 8.8 mg/dL (ref 8.7–10.2)
Chloride: 101 mmol/L (ref 96–106)
Creatinine, Ser: 0.7 mg/dL (ref 0.57–1.00)
GFR calc Af Amer: 117 mL/min/{1.73_m2} (ref >59)
GFR calc non Af Amer: 101 mL/min/{1.73_m2} (ref >59)
Globulin, Total: 2 g/dL (ref 1.5–4.5)
Glucose: 81 mg/dL (ref 65–99)
Potassium: 4.3 mmol/L (ref 3.5–5.2)
Sodium: 135 mmol/L (ref 134–144)
Total Protein: 6.5 g/dL (ref 6.0–8.5)

## 2020-11-23 LAB — CBC WITH DIFFERENTIAL/PLATELET
Basophils Absolute: 0 10*3/uL (ref 0.0–0.2)
Basos: 0 %
EOS (ABSOLUTE): 0.2 10*3/uL (ref 0.0–0.4)
Eos: 2 %
Hematocrit: 41.9 % (ref 34.0–46.6)
Hemoglobin: 14.6 g/dL (ref 11.1–15.9)
Immature Grans (Abs): 0 10*3/uL (ref 0.0–0.1)
Immature Granulocytes: 0 %
Lymphocytes Absolute: 2.6 10*3/uL (ref 0.7–3.1)
Lymphs: 22 %
MCH: 31.5 pg (ref 26.6–33.0)
MCHC: 34.8 g/dL (ref 31.5–35.7)
MCV: 90 fL (ref 79–97)
Monocytes Absolute: 1.1 10*3/uL — ABNORMAL HIGH (ref 0.1–0.9)
Monocytes: 9 %
Neutrophils Absolute: 7.7 10*3/uL — ABNORMAL HIGH (ref 1.4–7.0)
Neutrophils: 67 %
Platelets: 250 10*3/uL (ref 150–450)
RBC: 4.64 x10E6/uL (ref 3.77–5.28)
RDW: 13.3 % (ref 11.7–15.4)
WBC: 11.7 10*3/uL — ABNORMAL HIGH (ref 3.4–10.8)

## 2020-11-23 LAB — T4, FREE: Free T4: 1.3 ng/dL (ref 0.82–1.77)

## 2020-11-23 LAB — T3: T3, Total: 77 ng/dL (ref 71–180)

## 2020-11-23 LAB — TSH: TSH: 0.565 u[IU]/mL (ref 0.450–4.500)

## 2020-11-23 MED ORDER — LEVOTHYROXINE SODIUM 112 MCG PO TABS: 112.0000 ug | ORAL_TABLET | Freq: Every day | ORAL | 0 refills | Status: DC

## 2020-11-23 NOTE — Telephone Encounter (Signed)
Refill sent to [facility] drug

## 2020-11-24 ENCOUNTER — Telehealth: Payer: Self-pay | Admitting: Physician Assistant

## 2020-11-24 ENCOUNTER — Other Ambulatory Visit: Payer: Self-pay | Admitting: Physical Medicine and Rehabilitation

## 2020-11-24 NOTE — Telephone Encounter (Signed)
-----   Message from Lorrene Reid, [place] sent at 11/23/2020  5:14 PM EST ----- Please call Ms. Mcmurtry and notify thyroid labs are within normal limits so recommend to continue current dose of Levothyroxine (112 mcg). CMP is essentially within normal limits and CBC shows WBC is mildly elevated so recommend repeating in 4 weeks.   Thank you, Herb Grays

## 2020-11-26 ENCOUNTER — Encounter
Payer: Medicare Other | Attending: Physical Medicine and Rehabilitation | Admitting: Physical Medicine and Rehabilitation

## 2020-11-26 ENCOUNTER — Encounter: Payer: Self-pay | Admitting: Physical Medicine and Rehabilitation

## 2020-11-26 ENCOUNTER — Other Ambulatory Visit: Payer: Self-pay

## 2020-11-26 VITALS — BP 136/84 | HR 84 | Temp 97.9°F | Ht 63.5 in | Wt 177.2 lb

## 2020-11-26 DIAGNOSIS — M5412 Radiculopathy, cervical region: Secondary | ICD-10-CM | POA: Insufficient documentation

## 2020-11-26 DIAGNOSIS — M7918 Myalgia, other site: Secondary | ICD-10-CM

## 2020-11-26 DIAGNOSIS — S86001A Unspecified injury of right Achilles tendon, initial encounter: Secondary | ICD-10-CM

## 2020-11-26 DIAGNOSIS — G894 Chronic pain syndrome: Secondary | ICD-10-CM | POA: Diagnosis not present

## 2020-11-26 NOTE — Progress Notes (Signed)
Subjective:    Patient ID: Ariel King, female    DOB: 1971/02/22, 50 y.o.   MRN: [id number]  HPI  Ariel King is a 50 year old woman who presents for follow-up of Achilles tear and low back pain, and right sided cervical radiculiits.  1) Cervical radiculitis: She continues to have numbness in her right arm. The arm also feels heavy. We increased her Gabapentin to 4 pills per day and she did find that this helped with her pain. We further increased the dose to 600mg  and she felt that this worsened her sleep. We discussed decreasing the frequency of Gabapentin, and maintaining the dose at 600mg . Will get XR if symptoms worsen.  2) Diet: she got the ghee butter and loves it. She would like to lose weight. Discussed drinking teas and intermittent fasting and she would like to try these ideas.   3) Anxiety: She shared with me her traumatic experience of finding her boyfriend overdosed from cocaine, what a shock it was for her. She still missed him a lot. She has been following with Psychiatry and is very happy with her psychiatrist. She has been trying to use her Xanax less frequently-4x rather than 8x per day.    She is willing to increase further.   Tramadol was not helping much. Has tried Percocet in the past without much benefit. Continues to take Celecoxib BID without great benefit.  Trigger point injections did not provide benefit.  She would be interested in trying the stem cell patches we discussed last visit.   Prior history: Her dog dug a hole and she tripped in it and fell. This was one year ago. She was treated non-operatively. It was not torn badly enough to not have surgery.  She feels more unstable. She has done PT and did not feel benefit.   Average pains is 9/10, pain right now is 10/10.   Pain Inventory Average Pain 9 Pain Right Now 8 My pain is constant, sharp, burning, stabbing, tingling and aching  In the last 24 hours, has pain interfered with the  following? General activity 6 Relation with others 4 Enjoyment of life 4 What TIME of day is your pain at its worst? morning , daytime, evening and night Sleep (in general) Fair  Pain is worse with: walking, bending, sitting, inactivity and standing Pain improves with: heat/ice and medication Relief from Meds: 5  use a cane how many minutes can you walk? a few ability to climb steps?  yes do you drive?  yes  disabled: date disabled . I need assistance with the following:  dressing, household duties and shopping  numbness tingling trouble walking spasms confusion depression anxiety  new pt  new pt    Family History  Problem Relation Age of Onset  . Diabetes Mother   . Migraines Mother   . Heart attack Mother   . Depression Mother   . Hypertension Mother   . Alcohol abuse Father   . Cancer Father        prostate  . Depression Sister    Social History   Socioeconomic History  . Marital status: Single    Spouse name: Not on file  . Number of children: Not on file  . Years of education: Not on file  . Highest education level: Not on file  Occupational History  . Not on file  Tobacco Use  . Smoking status: Former Smoker    Types: Cigarettes  . Smokeless tobacco: Never Used  .  Tobacco comment: quit 10 years ago  Vaping Use  . Vaping Use: Never used  Substance and Sexual Activity  . Alcohol use: No  . Drug use: No  . Sexual activity: Yes    Partners: Male    Birth control/protection: Other-see comments    Comment: hyst  Other Topics Concern  . Not on file  Social History Narrative  . Not on file   Social Determinants of Health   Financial Resource Strain: Not on file  Food Insecurity: Not on file  Transportation Needs: Not on file  Physical Activity: Not on file  Stress: Not on file  Social Connections: Not on file   Past Surgical History:  Procedure Laterality Date  . ROBOTIC ASSISTED LAP VAGINAL HYSTERECTOMY    . SYNOVECTOMY WRIST      Past Medical History:  Diagnosis Date  . Abnormal Pap smear   . Adult hypothyroidism 12/19/2015  . Anemia   . Anxiety   . Bladder disorder   . Chronic fatigue and immune dysfunction syndrome ([place])   . Chronic kidney disease   . Depression   . Depression   . EBV infection   . H/O bladder infections   . H/O joint problems   . Hx of migraines   . Incontinence   . Thyroid disease    There were no vitals taken for this visit.  Opioid Risk Score:   Fall Risk Score:  `1  Depression screen PHQ 2/9  Depression screen [facility] 2/9 11/22/2020 09/21/2020 07/28/2020 05/04/2020 11/05/2019 10/15/2019 08/13/2019  Decreased Interest 1 2 3 1 1 1  0  Down, Depressed, Hopeless 1 2 3 2 3 3 1   PHQ - 2 Score 2 4 6 3 4 4 1   Altered sleeping 1 0 3 0 3 1 1   Tired, decreased energy 0 1 2 0 1 0 0  Change in appetite 0 1 1 0 0 1 0  Feeling bad or failure about yourself  1 2 3 2 1  0 1  Trouble concentrating 1 1 3 1 3 1 1   Moving slowly or fidgety/restless 0 1 3 0 3 1 0  Suicidal thoughts 0 0 0 0 0 0 0  PHQ-9 Score 5 10 21 6 15 8 4   Difficult doing work/chores - Very difficult - Somewhat difficult Somewhat difficult Extremely dIfficult Somewhat difficult  Some recent data might be hidden     Review of Systems  Musculoskeletal: Positive for back pain.       Shoulder pain Leg pain Feet pain  All other systems reviewed and are negative.      Objective:   Physical Exam Gen: no distress, normal appearing, BMI 30.90 HEENT: oral mucosa pink and moist, NCAT Cardio: Reg rate Chest: normal effort, normal rate of breathing Abd: soft, non-distended Ext: no edema Psych: pleasant, normal affect, tearful when discussing death of her boyfriend. Skin: intact Neuro:Alert and oriented x3. Musculoskeletal: Ambulates without AD, tenderness to palpation in muscles of lower back with palpable trigger points.     Assessment & Plan:  Ariel King is a 50 year old woman who presents after an Achilles tendon tear 1  year ago, as well as with low back and hip pain, and with new and intermittent right sided cervical radiculitis.   1) Chronic Pain Syndrome secondary to right sided Achilles tendon tear, low back and hip pain, and right sided cervical radiculitis.  -Discussed current symptoms of pain and history of pain.  -Discussed benefits of exercise in reducing  pain. -Signed pain contract and obtained UDS previously but she did not have great relief from Tramadol in past, so we have stopped this medication and she is doing well off opioids.  -Decrease Gabapentin to 600mg  TID -Discussed following foods that may reduce pain: 1) Ginger 2) Blueberries 3) Salmon 4) Pumpkin seeds 5) dark chocolate 6) turmeric 7) tart cherries 8) virgin olive oil 9) chilli peppers 10) mint   -Consider stem cell patches, I have contacted company and unfortunately we can not get her a free sample. She plans to purchase the patches for one month to try them.   2) Right sided SI joint pain -Trigger point injections provided minimal relief, she would like to try repeating next time, next time we will replace normal saline with marcaine.   3) Right sided cervical radiculitis: -Currently without neurological deficits -XR ordered.   4) Obesity: -BMI is currently 30.90  -Discussed benefits of intermittent fasting -Provided with tea recommendations.  -Recommended following podcasts: [employer] [place], Food [employer]

## 2020-11-26 NOTE — Patient Instructions (Addendum)
   [employer]  [place] [employer], [place], Organic: [place], white tea, green tea, chamomile and valerian root at night before sleeping- can find in [place],   Intermittent fasting  Podcasts: [employer] [place], Food [place],

## 2020-12-06 ENCOUNTER — Other Ambulatory Visit: Payer: Self-pay | Admitting: Physician Assistant

## 2020-12-06 DIAGNOSIS — J3089 Other allergic rhinitis: Secondary | ICD-10-CM

## 2020-12-08 ENCOUNTER — Telehealth: Payer: Self-pay | Admitting: Physician Assistant

## 2020-12-08 NOTE — Telephone Encounter (Signed)
Tried to call social security disability and was on hold for nine minutes and got no answer. Faxed over medical records request to [facility] at [phone]

## 2020-12-10 ENCOUNTER — Other Ambulatory Visit: Payer: Self-pay | Admitting: Physical Medicine and Rehabilitation

## 2020-12-14 DIAGNOSIS — M79676 Pain in unspecified toe(s): Secondary | ICD-10-CM

## 2020-12-15 ENCOUNTER — Ambulatory Visit (INDEPENDENT_AMBULATORY_CARE_PROVIDER_SITE_OTHER): Payer: Medicare Other | Admitting: Physician Assistant

## 2020-12-15 ENCOUNTER — Encounter: Payer: Self-pay | Admitting: Physician Assistant

## 2020-12-15 ENCOUNTER — Other Ambulatory Visit: Payer: Self-pay

## 2020-12-15 VITALS — BP 116/73 | HR 97 | Temp 99.5°F | Ht 63.5 in | Wt 177.7 lb

## 2020-12-15 DIAGNOSIS — Z0000 Encounter for general adult medical examination without abnormal findings: Secondary | ICD-10-CM | POA: Diagnosis not present

## 2020-12-15 DIAGNOSIS — D72829 Elevated white blood cell count, unspecified: Secondary | ICD-10-CM | POA: Diagnosis not present

## 2020-12-15 NOTE — Progress Notes (Signed)
Subjective:   Ariel King is a 50 y.o. female who presents for Medicare Annual (Subsequent) preventive examination.  Review of Systems    General:   No F/C, wt loss Pulm:   No DIB, SOB, pleuritic chest pain Card:  No CP, palpitations Abd:  No n/v/d or pain Ext:  No inc edema from baseline  Objective:    Today's Vitals   12/15/20 1316  BP: 116/73  Pulse: 97  Temp: 99.5 F (37.5 C)  SpO2: 94%  Weight: 177 lb 11.2 oz (80.6 kg)  Height: 5' 3.5" (1.613 m)   Body mass index is 30.98 kg/m.  Advanced Directives 06/20/2017 05/25/2016  Does Patient Have a Medical Advance Directive? No No  Would patient like information on creating a medical advance directive? Yes (MAU/Ambulatory/Procedural Areas - Information given) No - patient declined information;Yes - Educational materials given    Current Medications (verified) Outpatient Encounter Medications as of 12/15/2020  Medication Sig  . albuterol (VENTOLIN HFA) 108 (90 Base) MCG/ACT inhaler INHALE 1 TO 2 PUFFS INTO THE LUNGS EVERY 4 (FOUR) HOURS AS NEEDED FOR WHEEZING OR SHORTNESS OF BREATH  . ALPRAZolam (XANAX) 0.5 MG tablet Take 1-2 tablets by mouth every 6 (six) hours as needed.  Marland Kitchen amphetamine-dextroamphetamine (ADDERALL) 20 MG tablet Take 1 tablet by mouth 3 (three) times daily.  . celecoxib (CELEBREX) 200 MG capsule TAKE 1 CAPSULE TWICE DAILY  . diclofenac (FLECTOR) 1.3 % PTCH Place 1 patch onto the skin 2 (two) times daily.  . diclofenac Sodium (VOLTAREN) 1 % GEL Apply 4 g topically 4 (four) times daily.  . fluticasone (FLONASE) 50 MCG/ACT nasal spray Place 2 sprays into both nostrils daily.  Marland Kitchen gabapentin (NEURONTIN) 400 MG capsule Take 1 capsule (400 mg total) by mouth in the morning, at noon, in the evening, and at bedtime.  . gabapentin (NEURONTIN) 600 MG tablet TAKE 1 TABLET BY MOUTH 4 TIMES DAILY AS NEEDED.  Marland Kitchen ibuprofen (ADVIL) 800 MG tablet Take 1 tablet (800 mg total) by mouth every 8 (eight) hours as needed.  Marland Kitchen  levocetirizine (XYZAL) 5 MG tablet Take 1 tablet (5 mg total) by mouth every evening.  Marland Kitchen levothyroxine (SYNTHROID) 112 MCG tablet Take 1 tablet (112 mcg total) by mouth daily.  . methocarbamol (ROBAXIN) 500 MG tablet TAKE 1 TABLET (500 MG TOTAL) BY MOUTH NIGHTLY AS NEEDED  . montelukast (SINGULAIR) 10 MG tablet Take 1 tablet (10 mg total) by mouth at bedtime.  . predniSONE (STERAPRED UNI-PAK 21 TAB) 10 MG (21) TBPK tablet Take as directed  . QUEtiapine (SEROQUEL) 100 MG tablet Take 100 mg by mouth as needed.  . traZODone (DESYREL) 100 MG tablet Take 3 tablets by mouth at bedtime.   Marland Kitchen venlafaxine XR (EFFEXOR-XR) 75 MG 24 hr capsule Take 3 capsules by mouth every morning.   No facility-administered encounter medications on file as of 12/15/2020.    Allergies (verified) Naltrexone   History: Past Medical History:  Diagnosis Date  . Abnormal Pap smear   . Adult hypothyroidism 12/19/2015  . Anemia   . Anxiety   . Bladder disorder   . 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 Chronic kidney disease   . Depression   . Depression   . EBV infection   . H/O bladder infections   . H/O joint problems   . Hx of migraines   . Incontinence   . Thyroid disease    Past Surgical History:  Procedure Laterality Date  .  ROBOTIC ASSISTED LAP VAGINAL HYSTERECTOMY    . SYNOVECTOMY WRIST     Family History  Problem Relation Age of Onset  . Diabetes Mother   . Migraines Mother   . Heart attack Mother   . Depression Mother   . Hypertension Mother   . Alcohol abuse Father   . Cancer Father        prostate  . Depression Sister    Social History   Socioeconomic History  . Marital status: Single    Spouse name: Not on file  . Number of children: Not on file  . Years of education: Not on file  . Highest education level: Not on file  Occupational History  . Not on file  Tobacco Use  . Smoking status: Former Smoker    Types: Cigarettes  . Smokeless tobacco: Never Used  .  Tobacco comment: quit 10 years ago  Vaping Use  . Vaping Use: Never used  Substance and Sexual Activity  . Alcohol use: No  . Drug use: No  . Sexual activity: Yes    Partners: Male    Birth control/protection: Other-see comments    Comment: hyst  Other Topics Concern  . Not on file  Social History Narrative  . Not on file   Social Determinants of Health   Financial Resource Strain: Not on file  Food Insecurity: Not on file  Transportation Needs: Not on file  Physical Activity: Not on file  Stress: Not on file  Social Connections: Not on file    Tobacco Counseling Counseling given: Not Answered Comment: quit 10 years ago   Diabetic? No   Activities of Daily Living In your present state of health, do you have any difficulty performing the following activities: 12/15/2020 11/22/2020  Hearing? N Y  [place]? N Y  Comment - -  Difficulty concentrating or making decisions? [name]  Walking or climbing stairs? - Y  Dressing or bathing? N Y  Comment - -  Doing errands, shopping? N N  Comment - -  Some recent data might be hidden    Patient Care Team: Lorrene Reid, PA-C as PCP - General Chucky May, MD as Consulting Physician (Psychiatry) Garrel Ridgel, DPM as Consulting Physician (Podiatry) [facility] as Consulting Physician (Dermatology) Cira Servant, DO as Consulting Physician (Rehabilitation) Delsa Bern, MD as Consulting Physician (Obstetrics and Gynecology) Amalia Greenhouse, MD as Referring Physician (Endocrinology)  Indicate any recent Medical Services you may have received from other than [facility] providers in the past year (date may be approximate).     Assessment:   This is a routine wellness examination for Alexzandrea.  Hearing/Vision screen No exam data present  Dietary issues and exercise activities discussed: -Follow a heart healthy diet such as Mediterranean diet and stay as active as possible.  Goals   None    Depression  Screen PHQ 2/9 Scores 12/15/2020 11/22/2020 09/21/2020 07/28/2020 05/04/2020 11/05/2019 10/15/2019  PHQ - 2 Score 2 2 4 6 3 4 4   PHQ- 9 Score 5 5 10 21 6 15 8     Fall Risk Fall Risk  12/15/2020 11/22/2020 10/26/2020 07/28/2020 04/24/2019  Falls in the past year? 1 1 1 1  0  Number falls in past yr: 1 1 1 1  -  Injury with Fall? 0 0 0 0 -  Risk Factor Category  - - - - -  Risk for fall due to : History of fall(s) - - - -  Follow up -  Falls evaluation completed - Falls evaluation completed Falls evaluation completed    FALL RISK PREVENTION PERTAINING TO THE HOME:  Any stairs in or around the home? Yes  If so, are there any without handrails? Yes  Home free of loose throw rugs in walkways, pet beds, electrical cords, etc? Yes  Adequate lighting in your home to reduce risk of falls? No   ASSISTIVE DEVICES UTILIZED TO PREVENT FALLS:  Life alert? No  Use of a cane, walker or w/c? Sometimes I use my cane  Grab bars in the bathroom? Yes  Shower chair or bench in shower? Yes  Elevated toilet seat or a handicapped toilet? No   TIMED UP AND GO:  Was the test performed? Yes .  Length of time to ambulate 10 feet: 8 sec.   Gait steady and fast without use of assistive device  Cognitive Function: wnl     6CIT Screen 12/15/2020  What Year? 0 points  What month? 0 points  What time? 0 points  Count back from 20 0 points  Months in reverse 0 points  Repeat phrase 0 points  Total Score 0    Immunizations Immunization History  Administered Date(s) Administered  . Tdap 06/15/2014, 01/28/2018  . Zoster Recombinat (Shingrix) 05/04/2020    TDAP status: Up to date  Flu Vaccine status: Declined, Education has been provided regarding the importance of this vaccine but patient still declined. Advised may receive this vaccine at local pharmacy or Health Dept. Aware to provide a copy of the vaccination record if obtained from local pharmacy or Health Dept. Verbalized acceptance and  understanding.    Covid-19 vaccine status: Declined, Education has been provided regarding the importance of this vaccine but patient still declined. Advised may receive this vaccine at local pharmacy or Health Dept.or vaccine clinic. Aware to provide a copy of the vaccination record if obtained from local pharmacy or Health Dept. Verbalized acceptance and understanding.  Qualifies for Shingles Vaccine? Yes   Zostavax completed Yes   Shingrix Completed?: Yes  Screening Tests Health Maintenance  Topic Date Due  . COVID-19 Vaccine (1) Never done  . COLONOSCOPY (Pts 45-17yrs Insurance coverage will need to be confirmed)  Never done  . MAMMOGRAM  Never done  . INFLUENZA VACCINE  Never done  . TETANUS/TDAP  01/29/2028  . Hepatitis C Screening  Completed  . HIV Screening  Completed  . PAP SMEAR-Modifier  Discontinued    Health Maintenance  Health Maintenance Due  Topic Date Due  . COVID-19 Vaccine (1) Never done  . COLONOSCOPY (Pts 45-25yrs Insurance coverage will need to be confirmed)  Never done  . MAMMOGRAM  Never done  . INFLUENZA VACCINE  Never done    Colorectal cancer screening: Referral to GI placed PATIENT DECLINED. Pt aware the office will call re: appt.  Mammogram status: Ordered 12/15/20. Pt provided with contact info and advised to call to schedule appt.     Lung Cancer Screening: (Low Dose CT Chest recommended if Age 61-80 years, 30 pack-year currently smoking OR have quit w/in 15years.) does not qualify.   Lung Cancer Screening Referral:   Additional Screening:  Hepatitis C Screening: does qualify; Completed 10/25/16  Vision Screening: Recommended annual ophthalmology exams for early detection of glaucoma and other disorders of the eye. Is the patient up to date with their annual eye exam?  Yes  Who is the provider or what is the name of the office in which the patient attends annual eye exams?  If pt is not established with a provider, would they like to be  referred to a provider to establish care? No .   Dental Screening: Recommended annual dental exams for proper oral hygiene  Community Resource Referral / Chronic Care Management: CRR required this visit?  No   CCM required this visit?  No      Plan:  -Continue to follow up with various specialists. -Contact and schedule an appointment with your Ob-Gyn. -Follow up as scheduled.  I have personally reviewed and noted the following in the patient's chart:   . Medical and social history . Use of alcohol, tobacco or illicit drugs  . Current medications and supplements . Functional ability and status . Nutritional status . Physical activity . Advanced directives . List of other physicians . Hospitalizations, surgeries, and ER visits in previous 12 months . Vitals . Screenings to include cognitive, depression, and falls . Referrals and appointments  In addition, I have reviewed and discussed with patient certain preventive protocols, quality metrics, and best practice recommendations. A written personalized care plan for preventive services as well as general preventive health recommendations were provided to patient.

## 2020-12-15 NOTE — Patient Instructions (Signed)
Preventive Care 84-51 Years Old, Female Preventive care refers to lifestyle choices and visits with your health care provider that can promote health and wellness. This includes:  A yearly physical exam. This is also called an annual wellness visit.  Regular dental and eye exams.  Immunizations.  Screening for certain conditions.  Healthy lifestyle choices, such as: ? Eating a healthy diet. ? Getting regular exercise. ? Not using drugs or products that contain nicotine and tobacco. ? Limiting alcohol use. What can I expect for my preventive care visit? Physical exam Your health care provider will check your:  Height and weight. These may be used to calculate your BMI (body mass index). BMI is a measurement that tells if you are at a healthy weight.  Heart rate and blood pressure.  Body temperature.  Skin for abnormal spots. Counseling Your health care provider may ask you questions about your:  Past medical problems.  Family's medical history.  Alcohol, tobacco, and drug use.  Emotional well-being.  Home life and relationship well-being.  Sexual activity.  Diet, exercise, and sleep habits.  Work and work Statistician.  Access to firearms.  Method of birth control.  Menstrual cycle.  Pregnancy history. What immunizations do I need? Vaccines are usually given at various ages, according to a schedule. Your health care provider will recommend vaccines for you based on your age, medical history, and lifestyle or other factors, such as travel or where you work.   What tests do I need? Blood tests  Lipid and cholesterol levels. These may be checked every 5 years, or more often if you are over 3 years old.  Hepatitis C test.  Hepatitis B test. Screening  Lung cancer screening. You may have this screening every year starting at age 51 if you have a 30-pack-year history of smoking and currently smoke or have quit within the past 15 years.  Colorectal cancer  screening. ? All adults should have this screening starting at age 51 and continuing until age 17. ? Your health care provider may recommend screening at age 49 if you are at increased risk. ? You will have tests every 1-10 years, depending on your results and the type of screening test.  Diabetes screening. ? This is done by checking your blood sugar (glucose) after you have not eaten for a while (fasting). ? You may have this done every 1-3 years.  Mammogram. ? This may be done every 1-2 years. ? Talk with your health care provider about when you should start having regular mammograms. This may depend on whether you have a family history of breast cancer.  BRCA-related cancer screening. This may be done if you have a family history of breast, ovarian, tubal, or peritoneal cancers.  Pelvic exam and Pap test. ? This may be done every 3 years starting at age 10. ? Starting at age 11, this may be done every 5 years if you have a Pap test in combination with an HPV test. Other tests  STD (sexually transmitted disease) testing, if you are at risk.  Bone density scan. This is done to screen for osteoporosis. You may have this scan if you are at high risk for osteoporosis. Talk with your health care provider about your test results, treatment options, and if necessary, the need for more tests. Follow these instructions at home: Eating and drinking  Eat a diet that includes fresh fruits and vegetables, whole grains, lean protein, and low-fat dairy products.  Take vitamin and mineral supplements  as recommended by your health care provider.  Do not drink alcohol if: ? Your health care provider tells you not to drink. ? You are pregnant, may be pregnant, or are planning to become pregnant.  If you drink alcohol: ? Limit how much you have to 0-1 drink a day. ? Be aware of how much alcohol is in your drink. In the [place] one drink equals one 12 oz bottle of beer (355 mL), one 5 oz glass of  wine (148 mL), or one 1 oz glass of hard liquor (44 mL).   Lifestyle  Take daily care of your teeth and gums. Brush your teeth every morning and night with fluoride toothpaste. Floss one time each day.  Stay active. Exercise for at least 30 minutes 5 or more days each week.  Do not use any products that contain nicotine or tobacco, such as cigarettes, e-cigarettes, and chewing tobacco. If you need help quitting, ask your health care provider.  Do not use drugs.  If you are sexually active, practice safe sex. Use a condom or other form of protection to prevent STIs (sexually transmitted infections).  If you do not wish to become pregnant, use a form of birth control. If you plan to become pregnant, see your health care provider for a prepregnancy visit.  If told by your health care provider, take low-dose aspirin daily starting at age 51.  Find healthy ways to cope with stress, such as: ? Meditation, yoga, or listening to music. ? Journaling. ? Talking to a trusted person. ? Spending time with friends and family. Safety  Always wear your seat belt while driving or riding in a vehicle.  Do not drive: ? If you have been drinking alcohol. Do not ride with someone who has been drinking. ? When you are tired or distracted. ? While texting.  Wear a helmet and other protective equipment during sports activities.  If you have firearms in your house, make sure you follow all gun safety procedures. What's next?  Visit your health care provider once a year for an annual wellness visit.  Ask your health care provider how often you should have your eyes and teeth checked.  Stay up to date on all vaccines. This information is not intended to replace advice given to you by your health care provider. Make sure you discuss any questions you have with your health care provider. Document Revised: 08/03/2020 Document Reviewed: 07/11/2018 Elsevier Patient Education  2021 [employer]  

## 2020-12-16 LAB — CBC
Hematocrit: 41.6 % (ref 34.0–46.6)
Hemoglobin: 14.7 g/dL (ref 11.1–15.9)
MCH: 32.7 pg (ref 26.6–33.0)
MCHC: 35.3 g/dL (ref 31.5–35.7)
MCV: 92 fL (ref 79–97)
Platelets: 234 10*3/uL (ref 150–450)
RBC: 4.5 x10E6/uL (ref 3.77–5.28)
RDW: 13.1 % (ref 11.7–15.4)
WBC: 12.4 10*3/uL — ABNORMAL HIGH (ref 3.4–10.8)

## 2021-01-01 ENCOUNTER — Other Ambulatory Visit: Payer: Self-pay | Admitting: Sports Medicine

## 2021-01-01 DIAGNOSIS — S86011D Strain of right Achilles tendon, subsequent encounter: Secondary | ICD-10-CM

## 2021-01-02 NOTE — Telephone Encounter (Signed)
Please advise 

## 2021-01-04 ENCOUNTER — Ambulatory Visit: Payer: Medicare Other | Admitting: Physical Medicine and Rehabilitation

## 2021-01-05 ENCOUNTER — Encounter: Payer: Self-pay | Admitting: Physical Medicine and Rehabilitation

## 2021-01-05 ENCOUNTER — Encounter
Payer: Medicare Other | Attending: Physical Medicine and Rehabilitation | Admitting: Physical Medicine and Rehabilitation

## 2021-01-05 ENCOUNTER — Other Ambulatory Visit: Payer: Self-pay

## 2021-01-05 VITALS — BP 136/84 | Ht 63.5 in | Wt 177.0 lb

## 2021-01-05 DIAGNOSIS — M7061 Trochanteric bursitis, right hip: Secondary | ICD-10-CM | POA: Diagnosis not present

## 2021-01-05 DIAGNOSIS — F419 Anxiety disorder, unspecified: Secondary | ICD-10-CM | POA: Diagnosis not present

## 2021-01-05 NOTE — Progress Notes (Signed)
Subjective:    Patient ID: Ariel King, female    DOB: 08-11-1971, 50 y.o.   MRN: [id number]  HPI  Due to national recommendations of social distancing because of COVID 35, an audio/video tele-health visit is felt to be the most appropriate encounter for this patient at this time. See MyChart message from today for the patient's consent to a tele-health encounter with [facility]. This is a follow up by phone. The patient is at home. MD is at office.   Mrs. Brophy is a 50 year old woman who presents for follow-up of Achilles tear and low back pain, and right sided cervical radiculiits.  1) Hip pain, right:  -She has been feeling a lot of pain in the back, it radiates to her right hip, and down her right leg.  -She has trouble sleeping on this side at night.  -Standing, bending makes the pain worse. Right now she is sitting and it is hurting. -The heating pad and propping her feet helps -She has done PT -She would like to do the patches  -She is interested in [place].   2) Cervical radiculitis: She continues to have numbness in her right arm. The arm also feels heavy. We increased her Gabapentin to 4 pills per day and she did find that this helped with her pain. We further increased the dose to 600mg  and she felt that this worsened her sleep. We discussed decreasing the frequency of Gabapentin, and maintaining the dose at 600mg . Will get XR if symptoms worsen.  3) Diet: she got the ghee butter and loves it. She would like to lose weight. Discussed drinking teas and intermittent fasting and she would like to try these ideas.   4) Anxiety: She shared with me her traumatic experience of finding her boyfriend overdosed from cocaine, what a shock it was for her. She still missed him a lot. She has been following with Psychiatry and is very happy with her psychiatrist. She has been trying to use her Xanax less frequently-4x rather than 8x per day.  -She is  interested in learning more about teas  -She is interested in doing yoga- she also likes meditation.   She is willing to increase further.   Tramadol was not helping much. Has tried Percocet in the past without much benefit. Continues to take Celecoxib BID without great benefit.  Trigger point injections did not provide benefit.  She would be interested in trying the stem cell patches we discussed last visit.   Prior history: Her dog dug a hole and she tripped in it and fell. This was one year ago. She was treated non-operatively. It was not torn badly enough to not have surgery.  She feels more unstable. She has done PT and did not feel benefit.   Average pains is 9/10, pain right now is 10/10.   Pain Inventory Average Pain 9 Pain Right Now 10 My pain is constant, sharp, burning, stabbing, tingling and aching  In the last 24 hours, has pain interfered with the following? General activity 8 Relation with others 7 Enjoyment of life 7 What TIME of day is your pain at its worst? morning , daytime, evening, night and varies Sleep (in general) Fair  Pain is worse with: walking, bending, sitting, inactivity and standing Pain improves with: heat/ice and medication Relief from Meds:  4        Family History  Problem Relation Age of Onset  . Diabetes Mother   .  Migraines Mother   . Heart attack Mother   . Depression Mother   . Hypertension Mother   . Alcohol abuse Father   . Cancer Father        prostate  . Depression Sister    Social History   Socioeconomic History  . Marital status: Single    Spouse name: Not on file  . Number of children: Not on file  . Years of education: Not on file  . Highest education level: Not on file  Occupational History  . Not on file  Tobacco Use  . Smoking status: Former Smoker    Types: Cigarettes  . Smokeless tobacco: Never Used  . Tobacco comment: quit 10 years ago  Vaping Use  . Vaping Use: Never used  Substance and Sexual  Activity  . Alcohol use: No  . Drug use: No  . Sexual activity: Yes    Partners: Male    Birth control/protection: Other-see comments    Comment: hyst  Other Topics Concern  . Not on file  Social History Narrative  . Not on file   Social Determinants of Health   Financial Resource Strain: Not on file  Food Insecurity: Not on file  Transportation Needs: Not on file  Physical Activity: Not on file  Stress: Not on file  Social Connections: Not on file   Past Surgical History:  Procedure Laterality Date  . ROBOTIC ASSISTED LAP VAGINAL HYSTERECTOMY    . SYNOVECTOMY WRIST     Past Medical History:  Diagnosis Date  . Abnormal Pap smear   . Adult hypothyroidism 12/19/2015  . Anemia   . Anxiety   . Bladder disorder   . Chronic fatigue and immune dysfunction syndrome ([place])   . Chronic kidney disease   . Depression   . Depression   . EBV infection   . H/O bladder infections   . H/O joint problems   . Hx of migraines   . Incontinence   . Thyroid disease    BP 136/84 Comment: Video visit - last bp 11/26/20  Ht 5' 3.5" (1.613 m)   Wt 177 lb (80.3 kg)   BMI 30.86 kg/m   Opioid Risk Score:   Fall Risk Score:  `1  Depression screen PHQ 2/9  Depression screen [facility] 2/9 01/05/2021 12/15/2020 11/22/2020 09/21/2020 07/28/2020 05/04/2020 11/05/2019  Decreased Interest 1 1 1 2 3 1 1   Down, Depressed, Hopeless 1 1 1 2 3 2 3   PHQ - 2 Score 2 2 2 4 6 3 4   Altered sleeping - 0 1 0 3 0 3  Tired, decreased energy - 0 0 1 2 0 1  Change in appetite - 1 0 1 1 0 0  Feeling bad or failure about yourself  - 1 1 2 3 2 1   Trouble concentrating - 1 1 1 3 1 3   Moving slowly or fidgety/restless - 0 0 1 3 0 3  Suicidal thoughts - 0 0 0 0 0 0  PHQ-9 Score - 5 5 10 21 6 15   Difficult doing work/chores - Somewhat difficult - Very difficult - Somewhat difficult Somewhat difficult  Some recent data might be hidden     Review of Systems  Musculoskeletal: Positive for back pain.       Shoulder  pain Leg pain Feet pain  All other systems reviewed and are negative.      Objective:   Physical Exam Seen via phone today.     Assessment & Plan:  Mrs. Pomerleau is a 50 year old woman who presents after an Achilles tendon tear 1 year ago, as well as with low back and hip pain, and with new and intermittent right sided cervical radiculitis.   1) Chronic Pain Syndrome secondary to right sided Achilles tendon tear, right sided low back and hip pain, and right sided cervical radiculitis.  -Discussed current symptoms of pain and history of pain.  -Discussed benefits of exercise in reducing pain. -Currently hip pain is most bothersome to her -Recommended yoga (texted her link to Down Dog app), hip abductor strengthening -Texted her link to learn more about [employer] and discussed how this technology works. I will reach out to rep today to see how we can get this for her to try and to assess what cost would be for her.  -Signed pain contract and obtained UDS previously but she did not have great relief from Tramadol in past, so we have stopped this medication and she is doing well off opioids.  -Decrease Gabapentin to 600mg  TID -Discussed following foods that may reduce pain: 1) Ginger 2) Blueberries 3) Salmon 4) Pumpkin seeds 5) dark chocolate 6) turmeric 7) tart cherries 8) virgin olive oil 9) chilli peppers 10) mint   -Consider stem cell patches, I have contacted company and unfortunately we can not get her a free sample. She plans to purchase the patches for one month to try them.   2) Right sided SI joint pain -Trigger point injections provided minimal relief, she would like to try repeating next time, next time we will replace normal saline with marcaine.   3) Right sided cervical radiculitis: -Currently without neurological deficits -XR ordered.   4) Obesity: -BMI is currently 30.90  -Discussed benefits of intermittent fasting -Provided with tea recommendations.   -Recommended following podcasts: The Dhru Por[employer] Podcast, [employer]  5) Anxiety: -discussed link between anxiety and her pain -Provided with lists of foods, supplements, and teas for anxiety -Made goal to do 15 minutes daily yoga, and try to incorporate one anti-anxiety tea and food into her diet.  30 minutes spent in discussion of patient's pain, anxiety, recommending anti-anxiety foods, supplements, and teas, making a goal for her, educating regarding benefits of yoga, connection between pain and anxiety, discussion of how [employer] works, reaching out to [employer] rep to see how we can get this technolology for Ms. Novick.

## 2021-01-07 ENCOUNTER — Other Ambulatory Visit: Payer: Self-pay | Admitting: Physical Medicine and Rehabilitation

## 2021-01-07 DIAGNOSIS — F419 Anxiety disorder, unspecified: Secondary | ICD-10-CM

## 2021-01-07 MED ORDER — DULOXETINE HCL 20 MG PO CPEP: 20.0000 mg | ORAL_CAPSULE | Freq: Every day | ORAL | 2 refills | Status: DC

## 2021-01-11 ENCOUNTER — Other Ambulatory Visit: Payer: Self-pay | Admitting: Physical Medicine and Rehabilitation

## 2021-01-19 ENCOUNTER — Encounter
Payer: Medicare Other | Attending: Physical Medicine and Rehabilitation | Admitting: Physical Medicine and Rehabilitation

## 2021-01-19 ENCOUNTER — Other Ambulatory Visit: Payer: Self-pay | Admitting: Physical Medicine and Rehabilitation

## 2021-01-19 ENCOUNTER — Other Ambulatory Visit: Payer: Self-pay

## 2021-01-19 DIAGNOSIS — M542 Cervicalgia: Secondary | ICD-10-CM | POA: Diagnosis not present

## 2021-01-19 DIAGNOSIS — G894 Chronic pain syndrome: Secondary | ICD-10-CM | POA: Diagnosis not present

## 2021-01-19 DIAGNOSIS — S86001A Unspecified injury of right Achilles tendon, initial encounter: Secondary | ICD-10-CM

## 2021-01-19 DIAGNOSIS — N393 Stress incontinence (female) (male): Secondary | ICD-10-CM | POA: Insufficient documentation

## 2021-01-19 DIAGNOSIS — R300 Dysuria: Secondary | ICD-10-CM | POA: Insufficient documentation

## 2021-01-19 DIAGNOSIS — R519 Headache, unspecified: Secondary | ICD-10-CM | POA: Insufficient documentation

## 2021-01-19 MED ORDER — TOPIRAMATE 25 MG PO TABS
25.0000 mg | ORAL_TABLET | Freq: Every day | ORAL | 1 refills | Status: DC
Start: 2021-01-19 — End: 2021-01-20

## 2021-01-19 NOTE — Progress Notes (Addendum)
Subjective:    Patient ID: Ariel King, female    DOB: [birth year], 50 y.o.   MRN: [id number]  HPI  Due to national recommendations of social distancing because of COVID 51, an audio/video tele-health visit is felt to be the most appropriate encounter for this patient at this time. See MyChart message from today for the patient's consent to a tele-health encounter with [place] [facility]. This is a follow up tele-visit via phone. The patient is at home. MD is at office.   Ariel King is a 50 year old woman who presents for follow-up of Achilles tear and low back pain, and right sided neck and arm pain secondary to cervical radiculitis.   1) Hip pain, right:  -She has been feeling a lot of pain in the back, it radiates to her right hip, and down her right leg.  -She has trouble sleeping on this side at night.  -Standing, bending makes the pain worse. Right now she is sitting and it is hurting. -The heating pad and propping her feet helps -She has done PT -She would like to do the patches  -She continues to be interested in [place]. She has not heard from the rep.  -I have messaged [employer] regarding this and they have not yet received her forms. We have to set up a meeting in order to get a demo device.   2) Cervical radiculitis: She continues to have numbness in her right arm. The arm also feels heavy.  -She has not noticed great improvement with the Gabapentin and feels she is gaining weight with this- discussed weaning and replacing with Topamax which can help with pain while also helping her to lose weight.   3) Diet: she got the ghee butter and loves it. She would like to lose weight. Discussed drinking teas and intermittent fasting and she would like to try these ideas.   4) Anxiety: She shared with me her traumatic experience of finding her boyfriend overdosed from cocaine, what a shock it was for her. She still missed him a lot. She has been following  with Psychiatry and is very happy with her psychiatrist. She has been trying to use her Xanax less frequently-4x rather than 8x per day.  -She is interested in learning more about teas  -She is interested in doing yoga- she also likes meditation. -She does feel improvement with   -She does feel the Cymbalta helps but is not sure whether it has contributed to increased weight gain.   Prior history:  Tramadol was not helping much. Has tried Percocet in the past without much benefit. Continues to take Celecoxib BID without great benefit.  Trigger point injections did not provide benefit.  She would be interested in trying the stem cell patches we discussed last visit.   Her dog dug a hole and she tripped in it and fell. This was one year ago. She was treated non-operatively. It was not torn badly enough to not have surgery.  She feels more unstable. She has done PT and did not feel benefit.   Average pains is 9/10, pain right now is 10/10.   Pain Inventory Average Pain 9 Pain Right Now 10 My pain is constant, sharp, burning, stabbing, tingling and aching  In the last 24 hours, has pain interfered with the following? General activity 8 Relation with others 7 Enjoyment of life 7 What TIME of day is your pain at its worst? morning , daytime, evening, night  and varies Sleep (in general) Fair  Pain is worse with: walking, bending, sitting, inactivity and standing Pain improves with: heat/ice and medication Relief from Meds:  4        Family History  Problem Relation Age of Onset  . Diabetes Mother   . Migraines Mother   . Heart attack Mother   . Depression Mother   . Hypertension Mother   . Alcohol abuse Father   . Cancer Father        prostate  . Depression Sister    Social History   Socioeconomic History  . Marital status: Single    Spouse name: Not on file  . Number of children: Not on file  . Years of education: Not on file  . Highest education level: Not on  file  Occupational History  . Not on file  Tobacco Use  . Smoking status: Former Smoker    Types: Cigarettes  . Smokeless tobacco: Never Used  . Tobacco comment: quit 10 years ago  Vaping Use  . Vaping Use: Never used  Substance and Sexual Activity  . Alcohol use: No  . Drug use: No  . Sexual activity: Yes    Partners: Male    Birth control/protection: Other-see comments    Comment: hyst  Other Topics Concern  . Not on file  Social History Narrative  . Not on file   Social Determinants of Health   Financial Resource Strain: Not on file  Food Insecurity: Not on file  Transportation Needs: Not on file  Physical Activity: Not on file  Stress: Not on file  Social Connections: Not on file   Past Surgical History:  Procedure Laterality Date  . ROBOTIC ASSISTED LAP VAGINAL HYSTERECTOMY    . SYNOVECTOMY WRIST     Past Medical History:  Diagnosis Date  . Abnormal Pap smear   . Adult hypothyroidism 12/19/2015  . Anemia   . Anxiety   . Bladder disorder   . Chronic fatigue and immune dysfunction syndrome ([place])   . Chronic kidney disease   . Depression   . Depression   . EBV infection   . H/O bladder infections   . H/O joint problems   . Hx of migraines   . Incontinence   . Thyroid disease    There were no vitals taken for this visit.  Opioid Risk Score:   Fall Risk Score:  `1  Depression screen PHQ 2/9  Depression screen [facility] 2/9 01/05/2021 12/15/2020 11/22/2020 09/21/2020 07/28/2020 05/04/2020 11/05/2019  Decreased Interest 1 1 1 2 3 1 1   Down, Depressed, Hopeless 1 1 1 2 3 2 3   PHQ - 2 Score 2 2 2 4 6 3 4   Altered sleeping - 0 1 0 3 0 3  Tired, decreased energy - 0 0 1 2 0 1  Change in appetite - 1 0 1 1 0 0  Feeling bad or failure about yourself  - 1 1 2 3 2 1   Trouble concentrating - 1 1 1 3 1 3   Moving slowly or fidgety/restless - 0 0 1 3 0 3  Suicidal thoughts - 0 0 0 0 0 0  PHQ-9 Score - 5 5 10 21 6 15   Difficult doing work/chores - Somewhat difficult - Very  difficult - Somewhat difficult Somewhat difficult  Some recent data might be hidden     Review of Systems  Musculoskeletal: Positive for back pain.       Shoulder pain Leg pain Feet  pain  All other systems reviewed and are negative.      Objective:   Physical Exam Seen via phone today.     Assessment & Plan:  Mrs. Graul is a 50 year old woman who presents for follow-up after an Achilles tendon tear 1 year ago, as well as with low back and hip pain, and with new and intermittent right sided cervical radiculitis.   1) Chronic Pain Syndrome secondary to right sided Achilles tendon tear, right sided low back and hip pain, and right sided cervical radiculitis.  -Discussed current symptoms of pain and history of pain.  -Discussed benefits of exercise in reducing pain. -Currently hip pain is most bothersome to her -Recommended yoga (texted her link to [employer] app), hip abductor strengthening -Texted her link to learn more about [employer] and discussed how this technology works. I will reach out to rep today to see how we can get this for her to try and to assess what cost would be for her.  -Signed pain contract and obtained UDS previously but she did not have great relief from Tramadol in past, so we have stopped this medication and she is doing well off opioids.  -Discussed wean for Gabapentin as it is not helping and causing weight gain.  -Discussed adding Topamax instead since this can help with both pain and weight loss.  -Discussed following foods that may reduce pain: 1) Ginger 2) Blueberries 3) Salmon 4) Pumpkin seeds 5) dark chocolate 6) turmeric 7) tart cherries 8) virgin olive oil 9) chilli peppers 10) mint   -Consider stem cell patches, I have contacted company and unfortunately we can not get her a free sample. She plans to purchase the patches for one month to try them. - I have emailed [employer] since she has not yet heard from the rep. May need to resend forms  since they have not yet received them. Will need to conference with them before we can receive the demo kit.    2) Right sided SI joint pain -Trigger point injections provided minimal relief, she would like to try repeating next time, next time we will replace normal saline with marcaine.   3) Right sided cervical radiculitis: -Currently without neurological deficits -XR ordered.   4) Obesity: -BMI is currently 30.90  -Discussed benefits of intermittent fasting -Provided with tea recommendations.  -Recommended following podcasts: The [employer] Podcast, [employer]  5) Anxiety: -discussed link between anxiety and her pain -Provided with lists of foods, supplements, and teas for anxiety -Made goal to do 15 minutes daily yoga, and try to incorporate one anti-anxiety tea and food into her diet. -Cymbalta is helping but may have contributed to weight gain.   20 minutes spent in discussion of her pain, efficacy of Gabapentin and Cymbalta, weaning off Gabapentin and trying Topamax for pain, messaging [employer] rep to see why she has not yet heard from them and to ask about the demo kit.

## 2021-01-26 NOTE — Progress Notes (Signed)
Thank you for the clarification! I have added this!

## 2021-01-27 ENCOUNTER — Telehealth: Payer: Self-pay | Admitting: Physician Assistant

## 2021-01-27 NOTE — Telephone Encounter (Signed)
Patient needs a refill on Flonase and abuterol and sent to [facility] in [place]. Thanks.

## 2021-01-28 ENCOUNTER — Other Ambulatory Visit: Payer: Self-pay

## 2021-01-28 DIAGNOSIS — R0602 Shortness of breath: Secondary | ICD-10-CM

## 2021-01-28 DIAGNOSIS — J45909 Unspecified asthma, uncomplicated: Secondary | ICD-10-CM

## 2021-01-28 MED ORDER — ALBUTEROL SULFATE HFA 108 (90 BASE) MCG/ACT IN AERS: INHALATION_SPRAY | RESPIRATORY_TRACT | 0 refills | Status: DC

## 2021-01-28 MED ORDER — FLUTICASONE PROPIONATE 50 MCG/ACT NA SUSP: 2.0000 | Freq: Every day | NASAL | 0 refills | Status: DC

## 2021-01-28 NOTE — Telephone Encounter (Signed)
Called pt VM was full to leave a message; refills were sent to [facility] in [place]

## 2021-01-31 NOTE — Telephone Encounter (Signed)
Contacted patient to notify her that her Flonase and Albuterol was sent in but mailbox was still full.

## 2021-02-04 ENCOUNTER — Other Ambulatory Visit: Payer: Self-pay

## 2021-02-04 ENCOUNTER — Encounter (HOSPITAL_BASED_OUTPATIENT_CLINIC_OR_DEPARTMENT_OTHER): Payer: Medicare Other | Admitting: Physical Medicine and Rehabilitation

## 2021-02-04 ENCOUNTER — Other Ambulatory Visit: Payer: Self-pay | Admitting: Physical Medicine and Rehabilitation

## 2021-02-04 ENCOUNTER — Encounter: Payer: Self-pay | Admitting: Physical Medicine and Rehabilitation

## 2021-02-04 VITALS — BP 108/75 | HR 86 | Temp 98.1°F | Ht 63.5 in | Wt 181.0 lb

## 2021-02-04 DIAGNOSIS — N393 Stress incontinence (female) (male): Secondary | ICD-10-CM | POA: Diagnosis not present

## 2021-02-04 DIAGNOSIS — R300 Dysuria: Secondary | ICD-10-CM

## 2021-02-04 DIAGNOSIS — R519 Headache, unspecified: Secondary | ICD-10-CM | POA: Diagnosis not present

## 2021-02-04 MED ORDER — TOPIRAMATE 25 MG PO TABS: 25.0000 mg | ORAL_TABLET | Freq: Two times a day (BID) | ORAL | 0 refills | Status: DC

## 2021-02-04 NOTE — Patient Instructions (Addendum)
-  Discussed following foods that may reduce pain: 1) Ginger 2) Blueberries 3) Salmon 4) Pumpkin seeds 5) dark chocolate 6) turmeric 7) tart cherries 8) virgin olive oil 9) chilli peppers 10) mint 11) red wine 12) garlic  Link to further information on diet for chronic pain: [email or web address]   [employer] [phone])

## 2021-02-04 NOTE — Progress Notes (Signed)
Subjective:    Patient ID: Ariel King, female    DOB: 08-Jun-1971, 50 y.o.   MRN: [id number]  HPI  Ariel King is a 50 year old woman who presents for follow-up of Achilles tear and low back pain, and right sided cervical radiculiits.  1) Urinary incontinence: -she sometimes urinates in her bed at night.  -it also happens when she coughs.  -+ dysuria.  2) Chronic pain: -the topamax is helping to curb her appetite, but has not yet helped with her pain -she has weaned off the gabapentin  3) Cervical radiculitis: She continues to have numbness in her right arm. The arm also feels heavy. We increased her Gabapentin to 4 pills per day and she did find that this helped with her pain. We further increased the dose to 600mg  and she felt that this worsened her sleep. We discussed decreasing the frequency of Gabapentin, and maintaining the dose at 600mg . Will get XR if symptoms worsen.  4) Diet: she got the ghee butter and loves it. She would like to lose weight. Discussed drinking teas and intermittent fasting and she would like to try these ideas.   5) Anxiety: She shared with me her traumatic experience of finding her boyfriend overdosed from cocaine, what a shock it was for her. She still missed him a lot. She has been following with Psychiatry and is very happy with her psychiatrist. She has been trying to use her Xanax less frequently-4x rather than 8x per day.    Prior history:  Tramadol was not helping much. Has tried Percocet in the past without much benefit. Continues to take Celecoxib BID without great benefit.  Trigger point injections did not provide benefit.  She would be interested in trying the stem cell patches we discussed last visit.   Prior history: Her dog dug a hole and she tripped in it and fell. This was one year ago. She was treated non-operatively. It was not torn badly enough to not have surgery.  She feels more unstable. She has done PT and did not feel  benefit.   Average pains is 9/10, pain right now is 10/10.   Pain Inventory Average Pain 8 Pain Right Now 10 My pain is sharp, burning, stabbing, tingling and aching  In the last 24 hours, has pain interfered with the following? General activity 8 Relation with others 5 Enjoyment of life 8 What TIME of day is your pain at its worst? morning , daytime and evening Sleep (in general) Fair  Pain is worse with: walking, bending, sitting, inactivity, standing and some activites Pain improves with: . Relief from Meds: na    Family History  Problem Relation Age of Onset  . Diabetes Mother   . Migraines Mother   . Heart attack Mother   . Depression Mother   . Hypertension Mother   . Alcohol abuse Father   . Cancer Father        prostate  . Depression Sister    Social History   Socioeconomic History  . Marital status: Single    Spouse name: Not on file  . Number of children: Not on file  . Years of education: Not on file  . Highest education level: Not on file  Occupational History  . Not on file  Tobacco Use  . Smoking status: Former Smoker    Types: Cigarettes  . Smokeless tobacco: Never Used  . Tobacco comment: quit 10 years ago  Vaping Use  . Vaping  Use: Never used  Substance and Sexual Activity  . Alcohol use: No  . Drug use: No  . Sexual activity: Yes    Partners: Male    Birth control/protection: Other-see comments    Comment: hyst  Other Topics Concern  . Not on file  Social History Narrative  . Not on file   Social Determinants of Health   Financial Resource Strain: Not on file  Food Insecurity: Not on file  Transportation Needs: Not on file  Physical Activity: Not on file  Stress: Not on file  Social Connections: Not on file   Past Surgical History:  Procedure Laterality Date  . ROBOTIC ASSISTED LAP VAGINAL HYSTERECTOMY    . SYNOVECTOMY WRIST     Past Medical History:  Diagnosis Date  . Abnormal Pap smear   . Adult hypothyroidism  12/19/2015  . Anemia   . Anxiety   . Bladder disorder   . Chronic fatigue and immune dysfunction syndrome ([place])   . Chronic kidney disease   . Depression   . Depression   . EBV infection   . H/O bladder infections   . H/O joint problems   . Hx of migraines   . Incontinence   . Thyroid disease    BP 108/75   Pulse 86   Temp 98.1 F (36.7 C)   Ht 5' 3.5" (1.613 m)   Wt 181 lb (82.1 kg)   SpO2 96%   BMI 31.56 kg/m   Opioid Risk Score:   Fall Risk Score:  `1  Depression screen PHQ 2/9  Depression screen [facility] 2/9 01/05/2021 12/15/2020 11/22/2020 09/21/2020 07/28/2020 05/04/2020 11/05/2019  Decreased Interest 1 1 1 2 3 1 1   Down, Depressed, Hopeless 1 1 1 2 3 2 3   PHQ - 2 Score 2 2 2 4 6 3 4   Altered sleeping - 0 1 0 3 0 3  Tired, decreased energy - 0 0 1 2 0 1  Change in appetite - 1 0 1 1 0 0  Feeling bad or failure about yourself  - 1 1 2 3 2 1   Trouble concentrating - 1 1 1 3 1 3   Moving slowly or fidgety/restless - 0 0 1 3 0 3  Suicidal thoughts - 0 0 0 0 0 0  PHQ-9 Score - 5 5 10 21 6 15   Difficult doing work/chores - Somewhat difficult - Very difficult - Somewhat difficult Somewhat difficult  Some recent data might be hidden     Review of Systems  Musculoskeletal: Positive for back pain.       Shoulder pain Leg pain Feet pain  All other systems reviewed and are negative.      Objective:   Physical Exam Gen: no distress, normal appearing HEENT: oral mucosa pink and moist, NCAT Cardio: Reg rate Chest: normal effort, normal rate of breathing Abd: soft, non-distended Ext: no edema Psych: pleasant, normal affect Skin: intact Neuro:Alert and oriented x3. Cold on right medial ankle.  Musculoskeletal: Ambulates without AD, tenderness to palpation in muscles of lower back with palpable trigger points.     Assessment & Plan:  Ariel King is a 50 year old woman who presents after an Achilles tendon tear 1 year ago, as well as with low back and hip pain, and with new  and intermittent right sided cervical radiculitis.   1) Chronic Pain Syndrome secondary to right sided Achilles tendon tear, low back and hip pain, right sided cervical radiculitis, and bilateral headaches -Discussed current  symptoms of pain and history of pain.  -Discussed benefits of exercise in reducing pain. -Signed pain contract and obtained UDS previously but she did not have great relief from Tramadol in past, so we have stopped this medication and she is doing well off opioids.  -Weaned off Gabapentin to 600mg  TID -Increase Topamax to 25mg  BID.  -Discussed following foods that may reduce pain: 1) Ginger 2) Blueberries 3) Salmon 4) Pumpkin seeds 5) dark chocolate 6) turmeric 7) tart cherries 8) virgin olive oil 9) chilli peppers 10) mint 11) garlic  Link to further information on diet for chronic pain: [email or web address]    -Consider stem cell patches, I have contacted company and unfortunately we can not get her a free sample. She plans to purchase the patches for one month to try them.  -Called and emailed [employer] today to try to get device for her as soon as possible. Provided phone number for her to call company directly as she is willing to pay out of pocket for it.    2) Right sided SI joint pain -Trigger point injections provided minimal relief, she would like to try repeating next time, next time we will replace normal saline with marcaine.   3) Right sided cervical radiculitis: -Currently without neurological deficits -XR ordered.   4) Obesity: -BMI is currently 31. 56, weight 181- increased from last visit.  -Discussed benefits of intermittent fasting -Provided with tea recommendations.  -Recommended following podcasts: The [employer] Podcast, [employer]  5) Dysuria: -UA/UC ordered Recommended timed voiding.

## 2021-02-08 LAB — MICROSCOPIC EXAMINATION: Casts: NONE SEEN /lpf

## 2021-02-08 LAB — URINALYSIS, ROUTINE W REFLEX MICROSCOPIC
Bilirubin, UA: NEGATIVE
Glucose, UA: NEGATIVE
Ketones, UA: NEGATIVE
Nitrite, UA: NEGATIVE
Protein,UA: NEGATIVE
RBC, UA: NEGATIVE
Specific Gravity, UA: 1.019 (ref 1.005–1.030)
Urobilinogen, Ur: 0.2 mg/dL (ref 0.2–1.0)
pH, UA: 6.5 (ref 5.0–7.5)

## 2021-02-08 LAB — URINE CULTURE

## 2021-02-14 ENCOUNTER — Ambulatory Visit: Payer: Medicare Other | Admitting: Physical Medicine and Rehabilitation

## 2021-02-18 ENCOUNTER — Encounter: Payer: Medicare Other | Attending: Physical Medicine and Rehabilitation | Admitting: Psychology

## 2021-02-18 ENCOUNTER — Other Ambulatory Visit: Payer: Self-pay | Admitting: Physician Assistant

## 2021-02-18 DIAGNOSIS — R0602 Shortness of breath: Secondary | ICD-10-CM

## 2021-02-18 DIAGNOSIS — J3089 Other allergic rhinitis: Secondary | ICD-10-CM

## 2021-02-18 DIAGNOSIS — R300 Dysuria: Secondary | ICD-10-CM | POA: Insufficient documentation

## 2021-02-18 DIAGNOSIS — N393 Stress incontinence (female) (male): Secondary | ICD-10-CM | POA: Insufficient documentation

## 2021-02-18 DIAGNOSIS — R519 Headache, unspecified: Secondary | ICD-10-CM | POA: Insufficient documentation

## 2021-02-19 ENCOUNTER — Other Ambulatory Visit: Payer: Self-pay | Admitting: Physical Medicine and Rehabilitation

## 2021-02-19 MED ORDER — NITROFURANTOIN MONOHYD MACRO 100 MG PO CAPS: 100.0000 mg | ORAL_CAPSULE | Freq: Two times a day (BID) | ORAL | 0 refills | Status: AC

## 2021-02-21 ENCOUNTER — Other Ambulatory Visit: Payer: Self-pay | Admitting: Physician Assistant

## 2021-02-21 DIAGNOSIS — E039 Hypothyroidism, unspecified: Secondary | ICD-10-CM

## 2021-02-24 ENCOUNTER — Other Ambulatory Visit: Payer: Self-pay

## 2021-02-24 ENCOUNTER — Encounter: Payer: Medicare Other | Admitting: Physical Medicine and Rehabilitation

## 2021-02-28 ENCOUNTER — Other Ambulatory Visit: Payer: Self-pay | Admitting: Physical Medicine and Rehabilitation

## 2021-02-28 MED ORDER — OXYBUTYNIN CHLORIDE ER 10 MG PO TB24: 10.0000 mg | ORAL_TABLET | Freq: Every day | ORAL | 2 refills | Status: AC

## 2021-03-03 ENCOUNTER — Telehealth: Payer: Self-pay

## 2021-03-03 NOTE — Telephone Encounter (Signed)
Donavan Foil called and wanted to know if she should contact [place] or [place]? Because she is still waiting on approval for the treatment.   Please advise.  Thank you. Call back phone number [phone].

## 2021-03-22 ENCOUNTER — Ambulatory Visit: Payer: Medicare Other | Admitting: Physician Assistant

## 2021-03-25 ENCOUNTER — Encounter: Payer: Medicare Other | Admitting: Physical Medicine and Rehabilitation

## 2021-03-29 ENCOUNTER — Encounter
Payer: Medicare Other | Attending: Physical Medicine and Rehabilitation | Admitting: Physical Medicine and Rehabilitation

## 2021-03-29 DIAGNOSIS — R519 Headache, unspecified: Secondary | ICD-10-CM | POA: Insufficient documentation

## 2021-03-29 DIAGNOSIS — R300 Dysuria: Secondary | ICD-10-CM | POA: Insufficient documentation

## 2021-03-29 DIAGNOSIS — N393 Stress incontinence (female) (male): Secondary | ICD-10-CM | POA: Insufficient documentation

## 2021-05-03 IMAGING — US US EXTREM LOW*R* LIMITED
1 series · 14 of 20 positions shown · non-contrast
Comparison: radiographs dated 09/19/2019 and 02/25/2018

CLINICAL DATA: Right Achilles tendon tear. History of previous
Achilles tendon injury.

EXAM:
ULTRASOUND RIGHT LOWER EXTREMITY LIMITED
TECHNIQUE: Ultrasound examination of the lower extremity soft tissues was
performed in the area of clinical concern.

[Series 1: us extrem low*right* limited · 0.06mm/px · 20 acquisitions, 14 frames shown]
[im 1/20]
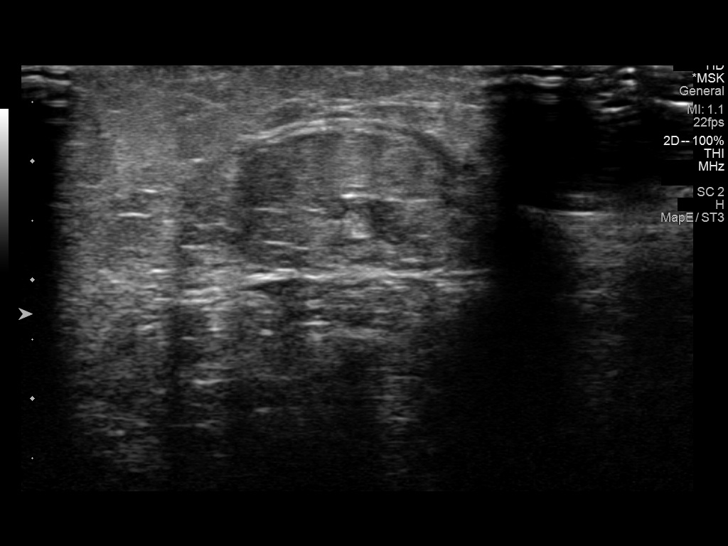
[im 3/20]
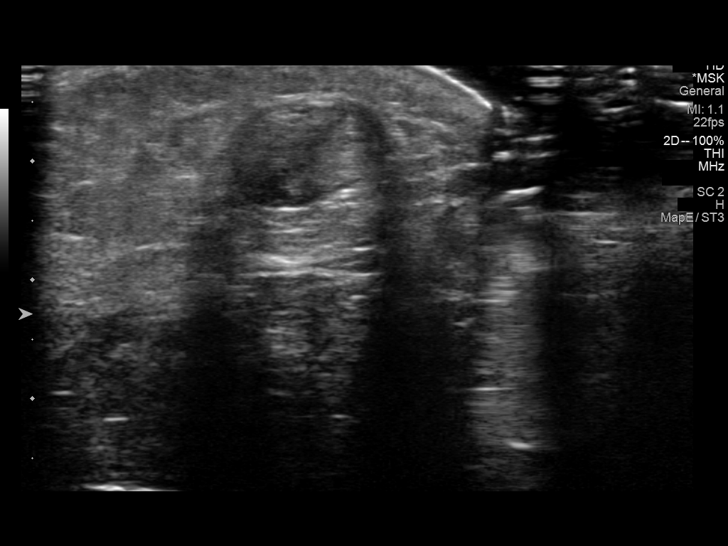
[im 4/20]
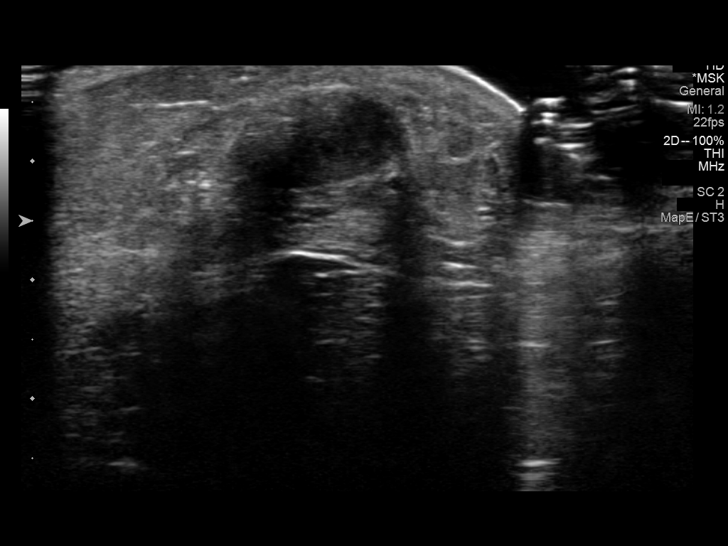
[im 6/20]
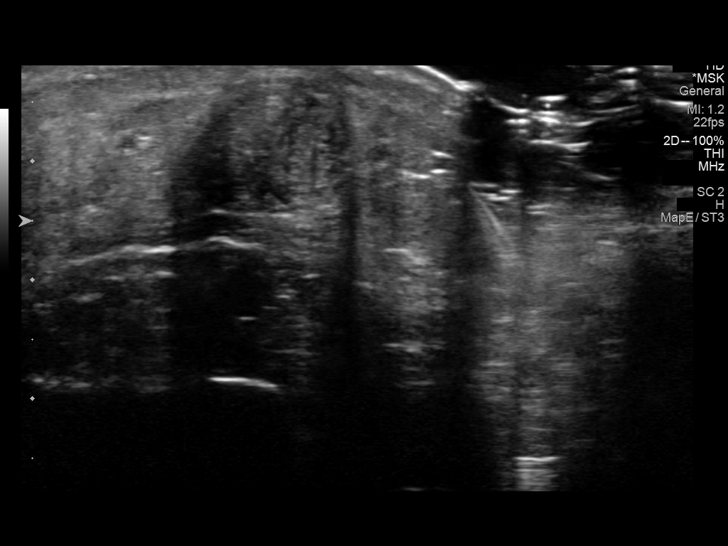
[im 7/20]
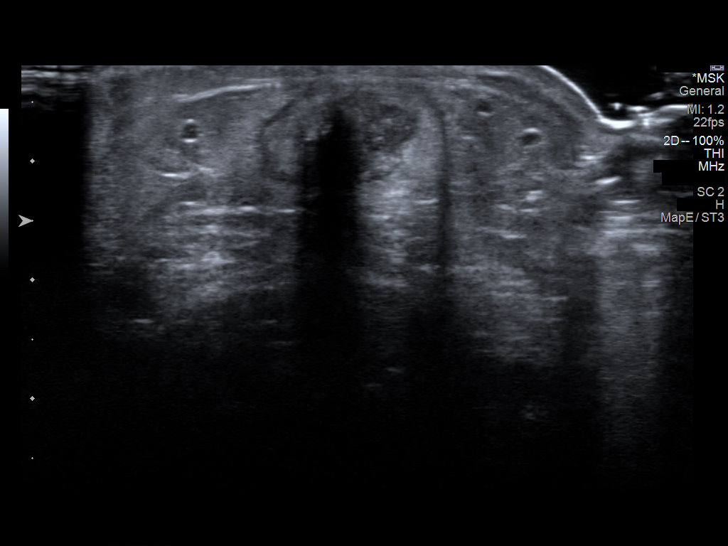
[im 8/20]
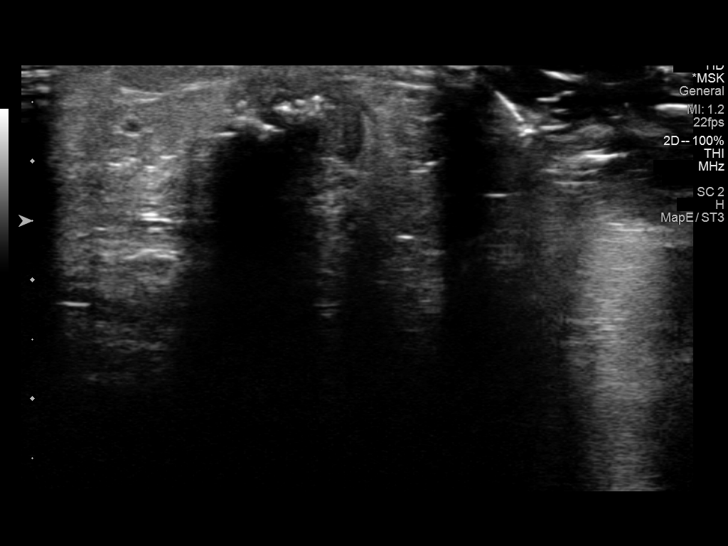
[im 10/20]
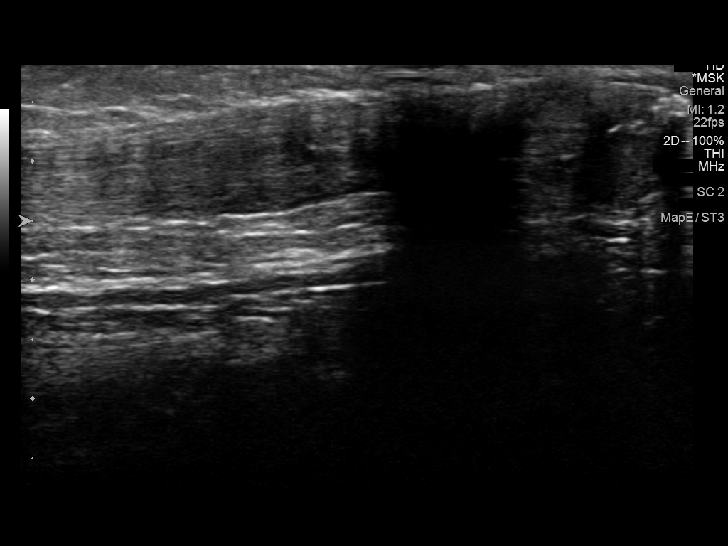
[im 11/20]
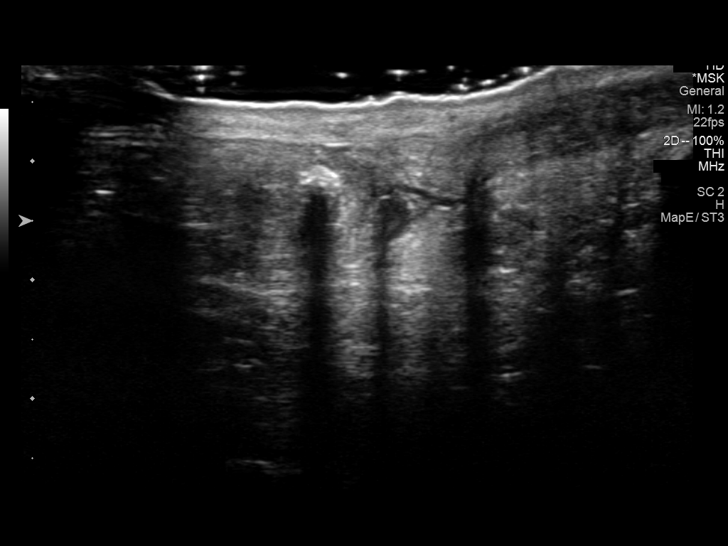
[im 13/20]
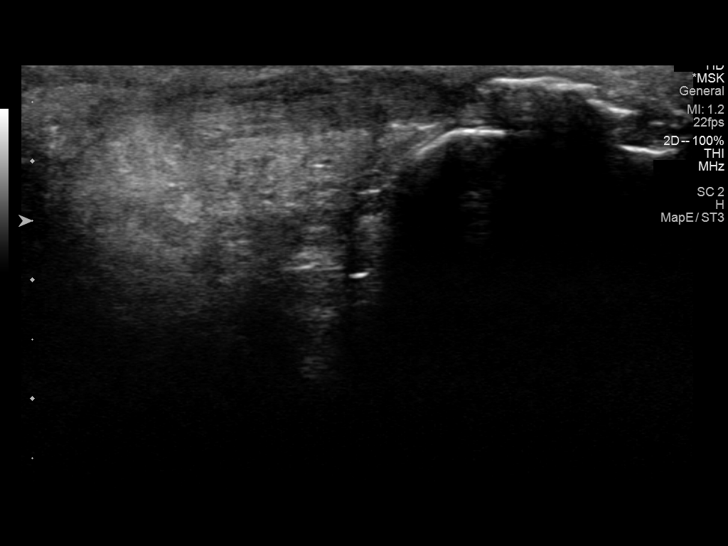
[im 14/20]
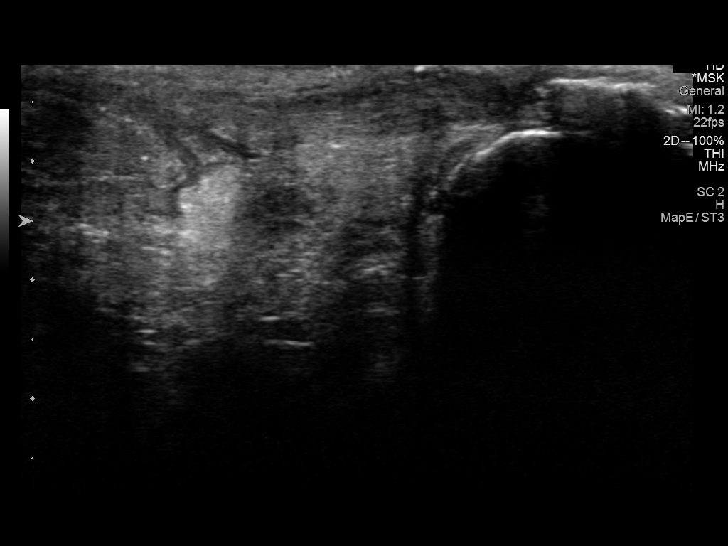
[im 16/20]
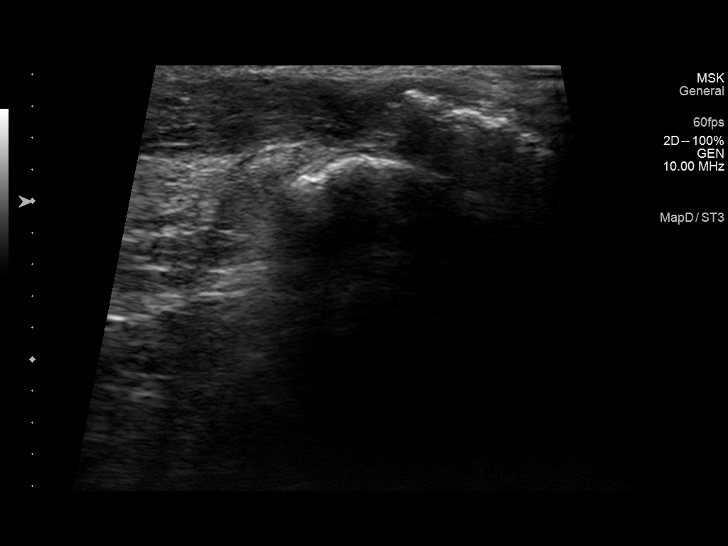
[im 17/20]
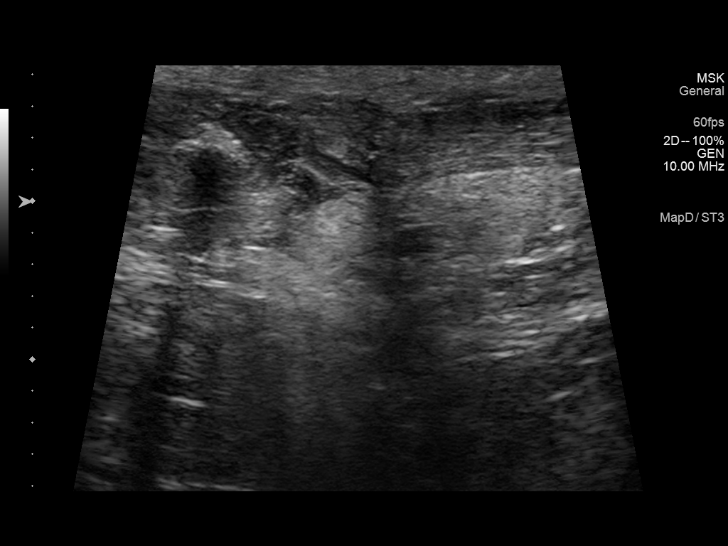
[im 18/20]
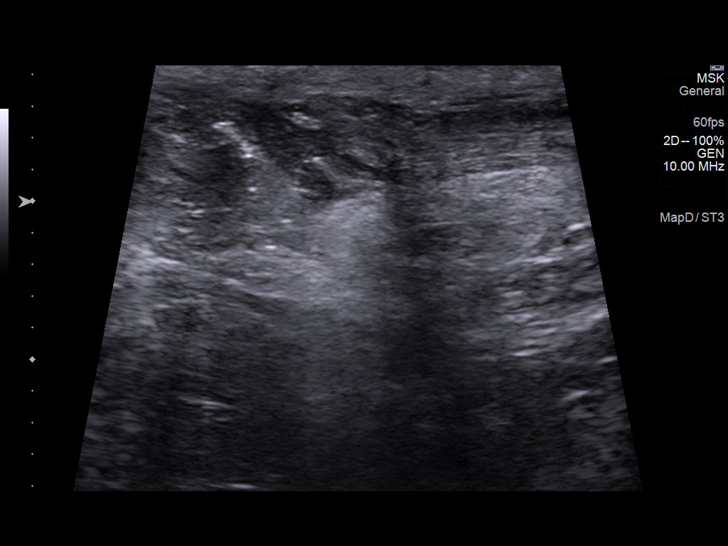
[im 20/20]
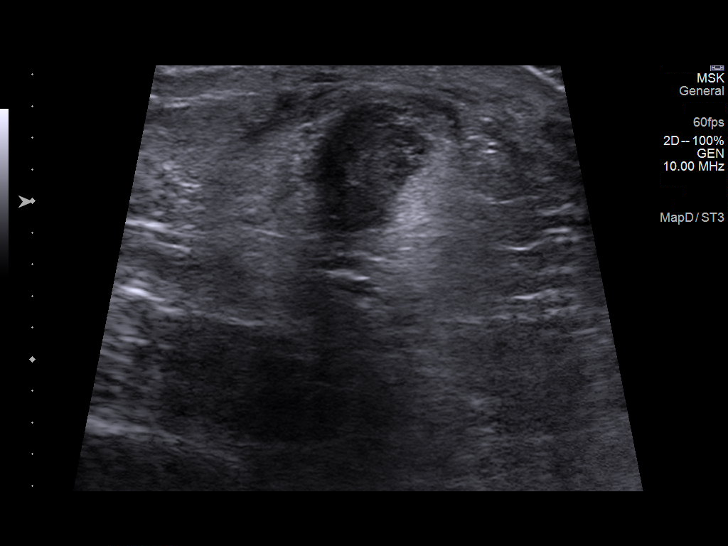

[14 of 20 positions shown; findings below may reference images not displayed]

FINDINGS: There is marked chronic hypertrophy of the Achilles tendon with
numerous internal calcifications in the tendon. There is a partial
tear of the tendon approximately 3 cm above the calcaneal insertion.
There is a Haglund deformity on the posterior calcaneus with
calcification in the distal Achilles at the insertion.

The lateral aspect of tendon is intact fibers at the site of the
tear. The distal Achilles tendon is not as hypertrophied as the more
proximal tendon above the tear. The hypertrophied portion of the
tendon is approximately 15 mm in diameter.
IMPRESSION: Chronic calcific Achilles tendinopathy with a partial tear of the
tendon 3 cm proximal to the insertion on the calcaneus.

## 2021-05-11 ENCOUNTER — Other Ambulatory Visit: Payer: Self-pay | Admitting: Physical Medicine and Rehabilitation

## 2021-05-11 MED ORDER — TOPIRAMATE 25 MG PO TABS: 25.0000 mg | ORAL_TABLET | Freq: Every day | ORAL | 3 refills | Status: DC | PRN

## 2021-05-12 ENCOUNTER — Other Ambulatory Visit: Payer: Self-pay | Admitting: Physical Medicine and Rehabilitation

## 2021-05-12 MED ORDER — TOPIRAMATE 25 MG PO TABS: 25.0000 mg | ORAL_TABLET | Freq: Every day | ORAL | 3 refills | Status: DC | PRN

## 2021-06-27 ENCOUNTER — Other Ambulatory Visit: Payer: Self-pay

## 2021-06-27 ENCOUNTER — Encounter
Payer: Medicare Other | Attending: Physical Medicine and Rehabilitation | Admitting: Physical Medicine and Rehabilitation

## 2021-06-27 ENCOUNTER — Encounter: Payer: Self-pay | Admitting: Physical Medicine and Rehabilitation

## 2021-06-27 VITALS — BP 92/68 | HR 90 | Temp 98.4°F | Ht 63.0 in | Wt 168.8 lb

## 2021-06-27 DIAGNOSIS — Z6830 Body mass index (BMI) 30.0-30.9, adult: Secondary | ICD-10-CM | POA: Diagnosis not present

## 2021-06-27 DIAGNOSIS — E6609 Other obesity due to excess calories: Secondary | ICD-10-CM | POA: Diagnosis not present

## 2021-06-27 DIAGNOSIS — F411 Generalized anxiety disorder: Secondary | ICD-10-CM

## 2021-06-27 MED ORDER — TOPIRAMATE 25 MG PO TABS: 25.0000 mg | ORAL_TABLET | Freq: Every day | ORAL | 3 refills | Status: DC | PRN

## 2021-06-27 NOTE — Progress Notes (Signed)
Subjective:    Patient ID: Ariel King, female    DOB: 1971/07/24, 50 y.o.   MRN: [id number]  HPI  [name] is a 50 year old woman who presents for f/u of Achilles tear and lumbago, and right sided cervical radiculiits.  1) Urinary incontinence: -she sometimes urinates in her bed at night.  -it also happens when she coughs.  -+ dysuria.  2) Chronic pain: -the topamax is helping to curb her appetite, but has not yet helped with her pain -she has weaned off the gabapentin  3) Cervical radiculitis: She continues to have numbness in her right arm. The arm also feels heavy. We increased her Gabapentin to 4 pills per day and she did find that this helped with her pain. We further increased the dose to '600mg'$  and she felt that this worsened her sleep. We discussed decreasing the frequency of Gabapentin, and maintaining the dose at '600mg'$ . Will get XR if symptoms worsen.  4) Diet: she got the ghee butter and loves it. She would like to lose weight. Discussed drinking teas and intermittent fasting and she would like to try these ideas.   5) Anxiety: She shared with me her traumatic experience of finding her boyfriend overdosed from cocaine, what a shock it was for her. She still missed him a lot. She has been following with Psychiatry and is very happy with her psychiatrist. She has been trying to use her Xanax less frequently-4x rather than 8x per day.  -her boy friend has been causing her a lot of anxiety.    Prior history:  Tramadol was not helping much. Has tried Percocet in the past without much benefit. Continues to take Celecoxib BID without great benefit.  Trigger point injections did not provide benefit.  She would be interested in trying the stem cell patches we discussed last visit.   Prior history: Her dog dug a hole and she tripped in it and fell. This was one year ago. She was treated non-operatively. It was not torn badly enough to not have surgery.  She feels more  unstable. She has done PT and did not feel benefit.   Average pains is 9/10, pain right now is 10/10.   Pain Inventory Average Pain 7 Pain Right Now 7 My pain is sharp, burning, stabbing, and aching  In the last 24 hours, has pain interfered with the following? General activity 7 Relation with others 8 Enjoyment of life 4 What TIME of day is your pain at its worst? morning , daytime, and evening Sleep (in general) Fair  Pain is worse with: walking, bending, sitting, standing, and some activites Pain improves with: meds , tens Relief from Meds: 5    Family History  Problem Relation Age of Onset   Diabetes Mother    Migraines Mother    Heart attack Mother    Depression Mother    Hypertension Mother    Alcohol abuse Father    Cancer Father        prostate   Depression Sister    Social History   Socioeconomic History   Marital status: Single    Spouse name: Not on file   Number of children: Not on file   Years of education: Not on file   Highest education level: Not on file  Occupational History   Not on file  Tobacco Use   Smoking status: Former    Types: Cigarettes   Smokeless tobacco: Never   Tobacco comments:  quit 10 years ago  Vaping Use   Vaping Use: Never used  Substance and Sexual Activity   Alcohol use: No   Drug use: No   Sexual activity: Yes    Partners: Male    Birth control/protection: Other-see comments    Comment: hyst  Other Topics Concern   Not on file  Social History Narrative   Not on file   Social Determinants of Health   Financial Resource Strain: Not on file  Food Insecurity: Not on file  Transportation Needs: Not on file  Physical Activity: Not on file  Stress: Not on file  Social Connections: Not on file   Past Surgical History:  Procedure Laterality Date   ROBOTIC ASSISTED LAP VAGINAL HYSTERECTOMY     SYNOVECTOMY WRIST     Past Medical History:  Diagnosis Date   Abnormal Pap smear    Adult hypothyroidism 12/19/2015    Anemia    Anxiety    Bladder disorder    Chronic fatigue and immune dysfunction syndrome ([facility])    Chronic kidney disease    Depression    Depression    EBV infection    H/O bladder infections    H/O joint problems    Hx of migraines    Incontinence    Thyroid disease    BP 92/68 (BP Location: Right Arm)   Pulse 90   Temp 98.4 F (36.9 C) (Oral)   Ht '5\' 3"'$  (1.6 m)   Wt 168 lb 12.8 oz (76.6 kg)   SpO2 98%   BMI 29.90 kg/m   Opioid Risk Score:   Fall Risk Score:  `1  Depression screen PHQ 2/9  Depression screen [facility] 2/9 01/05/2021 12/15/2020 11/22/2020 09/21/2020 07/28/2020 05/04/2020 11/05/2019  Decreased Interest '1 1 1 2 3 1 1  '$ Down, Depressed, Hopeless '1 1 1 2 3 2 3  '$ PHQ - 2 Score '2 2 2 4 6 3 4  '$ Altered sleeping - 0 1 0 3 0 3  Tired, decreased energy - 0 0 1 2 0 1  Change in appetite - 1 0 1 1 0 0  Feeling bad or failure about yourself  - '1 1 2 3 2 1  '$ Trouble concentrating - '1 1 1 3 1 3  '$ Moving slowly or fidgety/restless - 0 0 1 3 0 3  Suicidal thoughts - 0 0 0 0 0 0  PHQ-9 Score - '5 5 10 21 6 15  '$ Difficult doing work/chores - Somewhat difficult - Very difficult - Somewhat difficult Somewhat difficult  Some recent data might be hidden     Review of Systems  Constitutional: Negative.   HENT: Negative.    Eyes: Negative.   Respiratory: Negative.    Cardiovascular: Negative.   Gastrointestinal: Negative.   Endocrine: Negative.   Genitourinary: Negative.   Musculoskeletal:  Positive for back pain.       Shoulder pain Leg pain Feet pain  Skin: Negative.   Allergic/Immunologic: Negative.   Neurological: Negative.   Hematological: Negative.   Psychiatric/Behavioral: Negative.    All other systems reviewed and are negative.     Objective:   Physical Exam Gen: no distress, normal appearing HEENT: oral mucosa pink and moist, NCAT Cardio: Reg rate Chest: normal effort, normal rate of breathing Abd: soft, non-distended Ext: no edema Psych: pleasant, normal  affect Skin: intact Neuro:Alert and oriented x3. Cold on right medial ankle.  Musculoskeletal: Ambulates without AD, tenderness to palpation in muscles of lower back with palpable trigger  points.     Assessment & Plan:  [name] is a 50 year old woman who presents for f/u of  Achilles tendon tear 1 year ago, as well as with low back and hip pain, and with new and intermittent right sided cervical radiculitis.   1) Chronic Pain Syndrome secondary to right sided Achilles tendon tear, low back and hip pain, right sided cervical radiculitis, and bilateral headaches -Discussed current symptoms of pain and history of pain.  -Discussed benefits of exercise in reducing pain. -Signed pain contract and obtained UDS previously but she did not have great relief from Tramadol in past, so we have stopped this medication and she is doing well off opioids.  -Weaned off Gabapentin to '600mg'$  TID -Increase Topamax to '25mg'$  BID.  -Discussed following foods that may reduce pain: 1) Ginger 2) Blueberries 3) Salmon 4) Pumpkin seeds 5) dark chocolate 6) turmeric 7) tart cherries 8) virgin olive oil 9) chilli peppers 10) mint 11) garlic  Link to further information on diet for chronic pain: [email or web address]    -Consider stem cell patches, I have contacted company and unfortunately we can not get her a free sample. She plans to purchase the patches for one month to try them.  -She loves the [facility]! Continue daily.    2) Right sided SI joint pain -Trigger point injections provided minimal relief, she would like to try repeating next time, next time we will replace normal saline with marcaine.   3) Right sided cervical radiculitis: -Currently without neurological deficits -XR ordered.   4) Obesity: -Lost 13 lbs since I saw her last!! -Discussed benefits of intermittent fasting -Provided with tea recommendations.   -Recommended following podcasts: The [employer] Podcast, [employer] -refilled topamax -has been using [employer] and has been doing it every morning and it makes a big difference.  Obesity: -Educated that current weight is____ and current BMI is___ -Educated regarding health benefits of weight loss- for pain, general health, chronic disease prevention, immune health, mental health.  -Will monitor weight every visit.  -Consider [place] tea daily.  -Discussed the benefits of intermittent fasting. -Discussed foods that can assist in weight loss: 1) leafy greens- high in fiber and nutrients 2) dark chocolate- improves metabolism (if prefer sweetened, best to sweeten with honey instead of sugar).  3) cruciferous vegetables- high in fiber and protein 4) full fat yogurt: high in healthy fat, protein, calcium, and probiotics 5) apples- high in a variety of phytochemicals 6) nuts- high in fiber and protein that increase feelings of fullness 7) grapefruit: rich in nutrients, antioxidants, and fiber (not to be taken with anticoagulation) 8) beans- high in protein and fiber 9) salmon- has high quality protein and healthy fats 10) green tea- rich in polyphenols 11) eggs- rich in choline and vitamin D 12) tuna- high protein, boosts metabolism 13) avocado- decreases visceral abdominal fat 14) chicken (pasture raised): high in protein and iron 15) blueberries- reduce abdominal fat and cholesterol 16) whole grains- decreases calories retained during digestion, speeds metabolism 17) chia seeds- curb appetite 18) chilies- increases fat metabolism  -Discussed supplements that can be used:  1) Metatrim '400mg'$  BID 30 minutes before breakfast and dinner  2) Sphaeranthus indicus and Garcinia mangostana (combinations of these and #1 can be found in capsicum and zychrome  3) green coffee bean extract '400mg'$  twice per day or Irvingia (african mango) 150 to '300mg'$  twice per day.   5) Dysuria: -UA/UC  ordered Recommended timed voiding.   6) Anxiety: -Discussed the  following foods that have been show to reduce anxiety: 1) [place] nuts, mushrooms, soy beans due to their high selenium content. Upper limit of toxicity of selenium is 447mg/day so no more than 3-4 bBolivianuts per day.  2) Fatty fish such as salmon, mackerel, sardines, trout, and herring- high in omega-3 fatty acids 3) Eggs- increases serotonin and dopamine 4) Pumpkin seeds- high in omega-3 fatty acids 5) dark chocolate- high in flavanols that increase blood flow to brain 6) turmeric- take with black pepper to increase absorption 7) chamomile tea- antioxidant and anti-inflammatory properties 8) yogurt without sugar- supports gut-brain axis 9) green tea- contains L- theanine 10) blueberries- high in vitamin C and antioxidants 11) [place] high in tryptophan which gets converted to serotonin 12) bell peppers- rich in vitamin C and antioxidants 13) citrus fruits- rich in vitamin C and antioxidants 14) almonds- high in vitamin E and healthy fats 15) chia seeds- high in omega-3 fatty acids -Made goal to ____

## 2021-06-27 NOTE — Patient Instructions (Addendum)
Discussed foods that can assist in weight loss: 1) leafy greens- high in fiber and nutrients 2) dark chocolate- improves metabolism (if prefer sweetened, best to sweeten with honey instead of sugar).  3) cruciferous vegetables- high in fiber and protein 4) full fat yogurt: high in healthy fat, protein, calcium, and probiotics 5) apples- high in a variety of phytochemicals 6) nuts- high in fiber and protein that increase feelings of fullness 7) grapefruit: rich in nutrients, antioxidants, and fiber (not to be taken with anticoagulation) 8) beans- high in protein and fiber 9) salmon- has high quality protein and healthy fats 10) green tea- rich in polyphenols 11) eggs- rich in choline and vitamin D 12) tuna- high protein, boosts metabolism 13) avocado- decreases visceral abdominal fat 14) chicken (pasture raised): high in protein and iron 15) blueberries- reduce abdominal fat and cholesterol 16) whole grains- decreases calories retained during digestion, speeds metabolism 17) chia seeds- curb appetite 18) chilies- increases fat metabolism  -Discussed supplements that can be used:  1) Metatrim '400mg'$  BID 30 minutes before breakfast and dinner  2) Sphaeranthus indicus and Garcinia mangostana (combinations of these and #1 can be found in capsicum and zychrome  3) green coffee bean extract '400mg'$  twice per day or Irvingia (african mango) 150 to '300mg'$  twice per day.  -Discussed following foods that may reduce pain: 1) Ginger 2) Blueberries 3) Salmon 4) Pumpkin seeds 5) dark chocolate 6) turmeric 7) tart cherries 8) virgin olive oil 9) chilli peppers 10) mint 11) garlic   -Discussed the following foods that have been show to reduce anxiety: 1) [place] nuts, mushrooms, soy beans due to their high selenium content. Upper limit of toxicity of selenium is 472mg/day so no more than 3-4 bBolivianuts per day.  2) Fatty fish such as salmon, mackerel, sardines, trout, and herring- high in omega-3  fatty acids 3) Eggs- increases serotonin and dopamine 4) Pumpkin seeds- high in omega-3 fatty acids 5) dark chocolate- high in flavanols that increase blood flow to brain 6) turmeric- take with black pepper to increase absorption 7) chamomile tea- antioxidant and anti-inflammatory properties 8) yogurt without sugar- supports gut-brain axis 9) green tea- contains L- theanine 10) blueberries- high in vitamin C and antioxidants 11) [place] high in tryptophan which gets converted to serotonin 12) bell peppers- rich in vitamin C and antioxidants 13) citrus fruits- rich in vitamin C and antioxidants 14) almonds- high in vitamin E and healthy fats 15) chia seeds- high in omega-3 fatty acids

## 2021-08-12 ENCOUNTER — Ambulatory Visit: Payer: Medicare Other | Admitting: Physical Medicine and Rehabilitation

## 2021-08-22 ENCOUNTER — Other Ambulatory Visit: Payer: Self-pay | Admitting: Physician Assistant

## 2021-08-22 DIAGNOSIS — J3089 Other allergic rhinitis: Secondary | ICD-10-CM

## 2021-08-23 ENCOUNTER — Ambulatory Visit: Payer: Medicare Other | Admitting: Physical Medicine and Rehabilitation

## 2021-08-26 ENCOUNTER — Other Ambulatory Visit: Payer: Self-pay | Admitting: Sports Medicine

## 2021-08-26 ENCOUNTER — Other Ambulatory Visit: Payer: Self-pay | Admitting: Physician Assistant

## 2021-08-26 DIAGNOSIS — J4520 Mild intermittent asthma, uncomplicated: Secondary | ICD-10-CM

## 2021-08-26 DIAGNOSIS — E039 Hypothyroidism, unspecified: Secondary | ICD-10-CM

## 2021-08-26 DIAGNOSIS — J3089 Other allergic rhinitis: Secondary | ICD-10-CM

## 2021-09-21 ENCOUNTER — Other Ambulatory Visit: Payer: Self-pay | Admitting: Physician Assistant

## 2021-09-21 DIAGNOSIS — J3089 Other allergic rhinitis: Secondary | ICD-10-CM

## 2021-09-23 ENCOUNTER — Other Ambulatory Visit: Payer: Self-pay | Admitting: Physician Assistant

## 2021-09-23 DIAGNOSIS — E039 Hypothyroidism, unspecified: Secondary | ICD-10-CM

## 2021-09-27 ENCOUNTER — Other Ambulatory Visit: Payer: Self-pay

## 2021-09-27 ENCOUNTER — Encounter: Payer: Self-pay | Admitting: Physical Medicine and Rehabilitation

## 2021-09-27 ENCOUNTER — Encounter
Payer: Medicare Other | Attending: Physical Medicine and Rehabilitation | Admitting: Physical Medicine and Rehabilitation

## 2021-09-27 VITALS — BP 104/69 | HR 101 | Temp 99.1°F | Ht 63.0 in | Wt 168.8 lb

## 2021-09-27 DIAGNOSIS — M7918 Myalgia, other site: Secondary | ICD-10-CM | POA: Insufficient documentation

## 2021-09-27 DIAGNOSIS — G894 Chronic pain syndrome: Secondary | ICD-10-CM | POA: Insufficient documentation

## 2021-09-27 DIAGNOSIS — M5431 Sciatica, right side: Secondary | ICD-10-CM | POA: Insufficient documentation

## 2021-09-27 NOTE — Progress Notes (Signed)
Subjective:    Patient ID: Ariel King, female    DOB: [birth year], 50 y.o.   MRN: [id number]  HPI  Ariel King is a 50 year old woman who presents for f/u of Achilles tear and lumbago, and right sided cervical radiculiits.  1) Urinary incontinence: -she sometimes urinates in her bed at night.  -it also happens when she coughs.  -+ dysuria.  2) Chronic pain: -the topamax is helping to curb her appetite, but has not yet helped with her pain -she has weaned off the gabapentin Feels good with her biowave treatment -losing balance -has pain radiating from right side of back down into leg.  -changes shoes about 4 or 5 times per day.   3) Cervical radiculitis: She continues to have numbness in her right arm. The arm also feels heavy. We increased her Gabapentin to 4 pills per day and she did find that this helped with her pain. We further increased the dose to 600mg  and she felt that this worsened her sleep. We discussed decreasing the frequency of Gabapentin, and maintaining the dose at 600mg . Will get XR if symptoms worsen.  4) Diet: she got the ghee butter and loves it. She would like to lose weight. Discussed drinking teas and intermittent fasting and she would like to try these ideas.   5) Anxiety: She shared with me her traumatic experience of finding her boyfriend overdosed from cocaine, what a shock it was for her. She still missed him a lot. She has been following with Psychiatry and is very happy with her psychiatrist. She has been trying to use her Xanax less frequently-4x rather than 8x per day.  -her boy friend has been causing her a lot of anxiety.  -she has a lot on er doing different things in the house  6) Cervical myofasical pain -she has not tried trigger point injections before.   7) Fungal infection between toes -has to change toes frequently -soaks her feet in essential oils   Prior history:  Tramadol was not helping much. Has tried Percocet in the past  without much benefit. Continues to take Celecoxib BID without great benefit.  Trigger point injections did not provide benefit.  She would be interested in trying the stem cell patches we discussed last visit.   Prior history: Her dog dug a hole and she tripped in it and fell. This was one year ago. She was treated non-operatively. It was not torn badly enough to not have surgery.  She feels more unstable. She has done PT and did not feel benefit.   Average pains is 9/10, pain right now is 10/10.   Pain Inventory Average Pain 7 Pain Right Now 9 My pain is sharp, burning, stabbing, and tingling  In the last 24 hours, has pain interfered with the following? General activity 6 Relation with others 5 Enjoyment of life 6 What TIME of day is your pain at its worst? morning , evening, and night Sleep (in general) Fair  Pain is worse with: walking, bending, sitting, standing, and some activites Pain improves with: meds , tens Relief from Meds: 6    Family History  Problem Relation Age of Onset   Diabetes Mother    Migraines Mother    Heart attack Mother    Depression Mother    Hypertension Mother    Alcohol abuse Father    Cancer Father        prostate   Depression Sister    Social History  Socioeconomic History   Marital status: Single    Spouse name: Not on file   Number of children: Not on file   Years of education: Not on file   Highest education level: Not on file  Occupational History   Not on file  Tobacco Use   Smoking status: Former    Types: Cigarettes   Smokeless tobacco: Never   Tobacco comments:    quit 51 years ago  Vaping Use   Vaping Use: Never used  Substance and Sexual Activity   Alcohol use: No   Drug use: No   Sexual activity: Yes    Partners: Male    Birth control/protection: Other-see comments    Comment: hyst  Other Topics Concern   Not on file  Social History Narrative   Not on file   Social Determinants of Health   Financial  Resource Strain: Not on file  Food Insecurity: Not on file  Transportation Needs: Not on file  Physical Activity: Not on file  Stress: Not on file  Social Connections: Not on file   Past Surgical History:  Procedure Laterality Date   ROBOTIC ASSISTED LAP VAGINAL HYSTERECTOMY     SYNOVECTOMY WRIST     Past Medical History:  Diagnosis Date   Abnormal Pap smear    Adult hypothyroidism 12/19/2015   Anemia    Anxiety    Bladder disorder    Chronic fatigue and immune dysfunction syndrome (HCC)    Chronic kidney disease    Depression    Depression    EBV infection    H/O bladder infections    H/O joint problems    Hx of migraines    Incontinence    Thyroid disease    BP 104/69 (BP Location: Right Arm)   Pulse (!) 101   Temp 99.1 F (37.3 C)   Ht 5\' 3"  (1.6 m)   Wt 168 lb 12.8 oz (76.6 kg)   BMI 29.90 kg/m   Opioid Risk Score:   Fall Risk Score:  `1  Depression screen PHQ 2/9  Depression screen [facility] 2/9 01/05/2021 12/15/2020 11/22/2020 09/21/2020 07/28/2020 05/04/2020 11/05/2019  Decreased Interest 1 1 1 2 3 1 1   Down, Depressed, Hopeless 1 1 1 2 3 2 3   PHQ - 2 Score 2 2 2 4 6 3 4   Altered sleeping - 0 1 0 3 0 3  Tired, decreased energy - 0 0 1 2 0 1  Change in appetite - 1 0 1 1 0 0  Feeling bad or failure about yourself  - 1 1 2 3 2 1   Trouble concentrating - 1 1 1 3 1 3   Moving slowly or fidgety/restless - 0 0 1 3 0 3  Suicidal thoughts - 0 0 0 0 0 0  PHQ-9 Score - 5 5 10 21 6 15   Difficult doing work/chores - Somewhat difficult - Very difficult - Somewhat difficult Somewhat difficult  Some recent data might be hidden     Review of Systems  Constitutional: Negative.   HENT: Negative.    Eyes: Negative.   Respiratory: Negative.    Cardiovascular: Negative.   Gastrointestinal: Negative.   Endocrine: Negative.   Genitourinary: Negative.   Musculoskeletal:  Positive for back pain.       Right Shoulder pain Leg pain Feet pain  Skin: Negative.    Allergic/Immunologic: Negative.   Neurological: Negative.   Hematological: Negative.   Psychiatric/Behavioral: Negative.    All other systems reviewed and are  negative.     Objective:   Physical Exam Gen: no distress, normal appearing HEENT: oral mucosa pink and moist, NCAT Cardio: Reg rate Chest: normal effort, normal rate of breathing Abd: soft, non-distended Ext: no edema Psych: pleasant, normal affect Skin: intact Neuro:Alert and oriented x3. Cold on right medial ankle.  Musculoskeletal: Ambulates without AD, tenderness to palpation in muscles of lower back with palpable trigger points. +Kemp's test on the right.     Assessment & Plan:  Ariel King is a 50 year old woman who presents for follow-up of  Achilles tendon tear 1 year ago, as well as with low back and hip pain, and with new and intermittent right sided cervical radiculitis.   1) Chronic Pain Syndrome secondary to right sided Achilles tendon tear, low back and hip pain, right sided cervical radiculitis, and bilateral headaches -Discussed current symptoms of pain and history of pain.  -Discussed benefits of exercise in reducing pain. -Signed pain contract and obtained UDS previously but she did not have great relief from Tramadol in past, so we have stopped this medication and she is doing well off opioids.  -Weaned off Gabapentin to 600mg  TID -Continue Topamax to 25mg  BID.  -encouraged biking.  -continue biowave -Discussed following foods that may reduce pain: 1) Ginger 2) Blueberries 3) Salmon 4) Pumpkin seeds 5) dark chocolate 6) turmeric 7) tart cherries 8) virgin olive oil 9) chilli peppers 10) mint 11) garlic  Link to further information on diet for chronic pain: [email or web address]    -Consider stem cell patches, I have contacted company and unfortunately we can not get her a free sample. She plans to purchase the patches for  one month to try them.  -She loves the [facility]! Continue daily.    2) Right sided SI joint pain -Trigger point injections provided minimal relief, she would like to try repeating next time, next time we will replace normal saline with marcaine.   3) Right sided cervical radiculitis: -Currently without neurological deficits -XR ordered.   4) Obesity: -Lost 13 lbs since I saw her last!! -Discussed benefits of intermittent fasting -Provided with tea recommendations.  -Recommended following podcasts: The [employer] Podcast, [employer] -refilled topamax -has been using Biowave and has been doing it every morning and it makes a big difference.  -Will monitor weight every visit.  -Consider [place] tea daily.  -Discussed the benefits of intermittent fasting. -Discussed foods that can assist in weight loss: 1) leafy greens- high in fiber and nutrients 2) dark chocolate- improves metabolism (if prefer sweetened, best to sweeten with honey instead of sugar).  3) cruciferous vegetables- high in fiber and protein 4) full fat yogurt: high in healthy fat, protein, calcium, and probiotics 5) apples- high in a variety of phytochemicals 6) nuts- high in fiber and protein that increase feelings of fullness 7) grapefruit: rich in nutrients, antioxidants, and fiber (not to be taken with anticoagulation) 8) beans- high in protein and fiber 9) salmon- has high quality protein and healthy fats 10) green tea- rich in polyphenols 11) eggs- rich in choline and vitamin D 12) tuna- high protein, boosts metabolism 13) avocado- decreases visceral abdominal fat 14) chicken (pasture raised): high in protein and iron 15) blueberries- reduce abdominal fat and cholesterol 16) whole grains- decreases calories retained during digestion, speeds metabolism 17) chia seeds- curb appetite 18) chilies- increases fat metabolism  -Discussed supplements that can be used:  1) Metatrim 400mg  BID 30 minutes before  breakfast and dinner  2) Sphaeranthus indicus  and Garcinia mangostana (combinations of these and #1 can be found in capsicum and zychrome  3) green coffee bean extract 400mg  twice per day or Irvingia (african mango) 150 to 300mg  twice per day.   5) Dysuria: -UA/UC ordered Recommended timed voiding.   6) Anxiety: -Discussed the following foods that have been show to reduce anxiety: 1) [place] nuts, mushrooms, soy beans due to their high selenium content. Upper limit of toxicity of selenium is 472mcg/day so no more than 3-4 [place] nuts per day.  2) Fatty fish such as salmon, mackerel, sardines, trout, and herring- high in omega-3 fatty acids 3) Eggs- increases serotonin and dopamine 4) Pumpkin seeds- high in omega-3 fatty acids 5) dark chocolate- high in flavanols that increase blood flow to brain 6) turmeric- take with black pepper to increase absorption 7) chamomile tea- antioxidant and anti-inflammatory properties 8) yogurt without sugar- supports gut-brain axis 9) green tea- contains L- theanine 10) blueberries- high in vitamin C and antioxidants 11) [place]- high in tryptophan which gets converted to serotonin 12) bell peppers- rich in vitamin C and antioxidants 13) citrus fruits- rich in vitamin C and antioxidants 14) almonds- high in vitamin E and healthy fats 15) chia seeds- high in omega-3 fatty acids  7) Cervical myofascial pain: -trigger point injections next week.

## 2021-09-29 ENCOUNTER — Ambulatory Visit: Payer: Medicare Other | Admitting: Nurse Practitioner

## 2021-10-04 ENCOUNTER — Ambulatory Visit: Payer: Medicare Other | Admitting: Nurse Practitioner

## 2021-10-07 ENCOUNTER — Other Ambulatory Visit: Payer: Self-pay | Admitting: Nurse Practitioner

## 2021-10-07 DIAGNOSIS — E039 Hypothyroidism, unspecified: Secondary | ICD-10-CM

## 2021-10-20 ENCOUNTER — Other Ambulatory Visit: Payer: Self-pay | Admitting: Nurse Practitioner

## 2021-10-20 DIAGNOSIS — E039 Hypothyroidism, unspecified: Secondary | ICD-10-CM

## 2021-10-27 ENCOUNTER — Ambulatory Visit: Payer: Medicare Other | Admitting: Physician Assistant

## 2021-10-27 NOTE — Progress Notes (Deleted)
Established Patient Office Visit  Subjective:  Patient ID: Ariel King, female    DOB: 1970-11-27  Age: 50 y.o. MRN: [id number]  CC: No chief complaint on file.   HPI Ashrita Chrismer Agustin presents for follow up on hypothyroidism, RAD and mood.  Hypothyroidism:  Asthma:  Mood: Followed by psychiatry.   Past Medical History:  Diagnosis Date   Abnormal Pap smear    Adult hypothyroidism 12/19/2015   Anemia    Anxiety    Bladder disorder    Chronic fatigue and immune dysfunction syndrome (HCC)    Chronic kidney disease    Depression    Depression    EBV infection    H/O bladder infections    H/O joint problems    Hx of migraines    Incontinence    Thyroid disease     Past Surgical History:  Procedure Laterality Date   ROBOTIC ASSISTED LAP VAGINAL HYSTERECTOMY     SYNOVECTOMY WRIST      Family History  Problem Relation Age of Onset   Diabetes Mother    Migraines Mother    Heart attack Mother    Depression Mother    Hypertension Mother    Alcohol abuse Father    Cancer Father        prostate   Depression Sister     Social History   Socioeconomic History   Marital status: Single    Spouse name: Not on file   Number of children: Not on file   Years of education: Not on file   Highest education level: Not on file  Occupational History   Not on file  Tobacco Use   Smoking status: Former    Types: Cigarettes   Smokeless tobacco: Never   Tobacco comments:    quit 10 years ago  Vaping Use   Vaping Use: Never used  Substance and Sexual Activity   Alcohol use: No   Drug use: No   Sexual activity: Yes    Partners: Male    Birth control/protection: Other-see comments    Comment: hyst  Other Topics Concern   Not on file  Social History Narrative   Not on file   Social Determinants of Health   Financial Resource Strain: Not on file  Food Insecurity: Not on file  Transportation Needs: Not on file  Physical Activity: Not on file  Stress: Not on  file  Social Connections: Not on file  Intimate Partner Violence: Not on file    Outpatient Medications Prior to Visit  Medication Sig Dispense Refill   albuterol (VENTOLIN HFA) 108 (90 Base) MCG/ACT inhaler INHALE 1 TO 2 PUFFS INTO THE LUNGS EVERY 4 HOURS AS NEEDED FOR WHEEZING OR SHORTNESS OF BREATH 6.7 g 0   ALPRAZolam (XANAX) 0.5 MG tablet Take 1-2 tablets by mouth every 6 (six) hours as needed.     amphetamine-dextroamphetamine (ADDERALL) 20 MG tablet Take 1 tablet by mouth 3 (three) times daily.     celecoxib (CELEBREX) 200 MG capsule TAKE 1 CAPSULE BY MOUTH TWICE A DAY 180 capsule 0   diclofenac (FLECTOR) 1.3 % PTCH Place 1 patch onto the skin 2 (two) times daily. 60 patch 1   diclofenac Sodium (VOLTAREN) 1 % GEL APPLY 4 GRAMS TO AFFECTED AREA 4 TIMES DAILY. 100 g 2   dilTIAZem HCl Coated Beads (CARDIZEM CD PO)      DULoxetine (CYMBALTA) 20 MG capsule Take 1 capsule (20 mg total) by mouth daily. 60 capsule 2  Escitalopram Oxalate (LEXAPRO PO)      fluticasone (FLONASE) 50 MCG/ACT nasal spray Place 2 sprays into both nostrils daily. 182 mL 0   gabapentin (NEURONTIN) 400 MG capsule Take 1 capsule (400 mg total) by mouth in the morning, at noon, in the evening, and at bedtime. 120 capsule 0   gabapentin (NEURONTIN) 600 MG tablet TAKE 1 TABLET BY MOUTH 4 TIMES DAILY AS NEEDED. 120 tablet 0   ibuprofen (ADVIL) 800 MG tablet Take 1 tablet (800 mg total) by mouth every 8 (eight) hours as needed. 30 tablet 0   levocetirizine (XYZAL) 5 MG tablet TAKE 1 TABLET (5 MG TOTAL) BY MOUTH EVERY EVENING. **PLEASE CONTACT OUR OFFICE TO SCHEDULE A FOLLOW UP FOR FUTURE MED REFILLS** 90 tablet 1   levothyroxine (SYNTHROID) 112 MCG tablet TAKE 1 TABLET (112 MCG TOTAL) BY MOUTH DAILY. **PLEASE CONTACT OUR OFFICE TO SCHEDULE A FOLLOW UP FOR FUTURE MED REFILLS** 15 tablet 0   methocarbamol (ROBAXIN) 500 MG tablet TAKE 1 TABLET (500 MG TOTAL) BY MOUTH NIGHTLY AS NEEDED  3   montelukast (SINGULAIR) 10 MG tablet  Take 1 tablet (10 mg total) by mouth at bedtime. **PLEASE CONTACT OUR OFFICE TO SCHEDULE A FOLLOW UP FOR FUTURE MED REFILLS** 30 tablet 0   oxybutynin (DITROPAN XL) 10 MG 24 hr tablet Take 1 tablet (10 mg total) by mouth daily. 30 tablet 2   predniSONE (STERAPRED UNI-PAK 21 TAB) 10 MG (21) TBPK tablet Take as directed (Patient not taking: Reported on 09/27/2021) 21 tablet 0   QUEtiapine (SEROQUEL) 100 MG tablet Take 100 mg by mouth as needed.     topiramate (TOPAMAX) 25 MG tablet Take 1 tablet (25 mg total) by mouth 5 (five) times daily as needed (pain). 150 tablet 3   traZODone (DESYREL) 100 MG tablet Take 3 tablets by mouth at bedtime.      venlafaxine XR (EFFEXOR-XR) 75 MG 24 hr capsule Take 3 capsules by mouth every morning.     No facility-administered medications prior to visit.    Allergies  Allergen Reactions   Naltrexone Other (See Comments)    Panic attack    ROS Review of Systems Review of Systems:  A fourteen system review of systems was performed and found to be positive as per HPI.   Objective:    Physical Exam General:  Well Developed, well nourished, appropriate for stated age.  Neuro:  Alert and oriented,  extra-ocular muscles intact  HEENT:  Normocephalic, atraumatic, neck supple Skin:  no gross rash, warm, pink. Cardiac:  RRR, S1 S2 Respiratory: CTA B/L, Not using accessory muscles, speaking in full sentences- unlabored. Vascular:  Ext warm, no cyanosis apprec.; cap RF less 2 sec. Psych:  No HI/SI, judgement and insight good, Euthymic mood. Full Affect.  There were no vitals taken for this visit. Wt Readings from Last 3 Encounters:  09/27/21 168 lb 12.8 oz (76.6 kg)  06/27/21 168 lb 12.8 oz (76.6 kg)  02/04/21 181 lb (82.1 kg)     Health Maintenance Due  Topic Date Due   COVID-19 Vaccine (1) Never done   Pneumococcal Vaccine 88-6 Years old (1 - PCV) Never done   COLONOSCOPY (Pts 45-74yrs Insurance coverage will need to be confirmed)  Never done    MAMMOGRAM  Never done   Zoster Vaccines- Shingrix (2 of 2) 06/29/2020   INFLUENZA VACCINE  Never done    There are no preventive care reminders to display for this patient.  Lab Results  Component  Value Date   TSH 0.565 11/22/2020   Lab Results  Component Value Date   WBC 12.4 (H) 12/15/2020   HGB 14.7 12/15/2020   HCT 41.6 12/15/2020   MCV 92 12/15/2020   PLT 234 12/15/2020   Lab Results  Component Value Date   NA 135 11/22/2020   K 4.3 11/22/2020   CO2 23 11/22/2020   GLUCOSE 81 11/22/2020   BUN 13 11/22/2020   CREATININE 0.70 11/22/2020   BILITOT 0.2 11/22/2020   ALKPHOS 58 11/22/2020   AST 10 11/22/2020   ALT 9 11/22/2020   PROT 6.5 11/22/2020   ALBUMIN 4.5 11/22/2020   CALCIUM 8.8 11/22/2020   Lab Results  Component Value Date   CHOL 160 04/30/2020   Lab Results  Component Value Date   HDL 62 04/30/2020   Lab Results  Component Value Date   LDLCALC 73 04/30/2020   Lab Results  Component Value Date   TRIG 148 04/30/2020   Lab Results  Component Value Date   CHOLHDL 2.6 04/30/2020   Lab Results  Component Value Date   HGBA1C 5.0 04/30/2020      Assessment & Plan:   Problem List Items Addressed This Visit       Respiratory   Reactive airway disease with wheezing     Endocrine   Adult hypothyroidism - Primary (Chronic)    No orders of the defined types were placed in this encounter.   Follow-up: No follow-ups on file.    Lorrene Reid, PA-C

## 2021-11-03 ENCOUNTER — Other Ambulatory Visit: Payer: Self-pay | Admitting: Physician Assistant

## 2021-11-03 DIAGNOSIS — E039 Hypothyroidism, unspecified: Secondary | ICD-10-CM

## 2021-11-10 ENCOUNTER — Encounter: Payer: Self-pay | Admitting: Physician Assistant

## 2021-11-10 ENCOUNTER — Ambulatory Visit (INDEPENDENT_AMBULATORY_CARE_PROVIDER_SITE_OTHER): Payer: Medicare Other | Admitting: Physician Assistant

## 2021-11-10 ENCOUNTER — Other Ambulatory Visit: Payer: Self-pay

## 2021-11-10 VITALS — BP 102/66 | HR 84 | Temp 98.2°F | Ht 63.0 in | Wt 168.0 lb

## 2021-11-10 DIAGNOSIS — E039 Hypothyroidism, unspecified: Secondary | ICD-10-CM | POA: Diagnosis not present

## 2021-11-10 DIAGNOSIS — J3089 Other allergic rhinitis: Secondary | ICD-10-CM

## 2021-11-10 DIAGNOSIS — J4520 Mild intermittent asthma, uncomplicated: Secondary | ICD-10-CM

## 2021-11-10 DIAGNOSIS — F332 Major depressive disorder, recurrent severe without psychotic features: Secondary | ICD-10-CM | POA: Diagnosis not present

## 2021-11-10 MED ORDER — MONTELUKAST SODIUM 10 MG PO TABS: 10.0000 mg | ORAL_TABLET | Freq: Every day | ORAL | 1 refills | Status: DC

## 2021-11-10 NOTE — Progress Notes (Signed)
Established Patient Office Visit  Subjective:  Patient ID: Ariel King, female    DOB: 1971/10/16  Age: 50 y.o. MRN: [id number]  CC:  Chief Complaint  Patient presents with   Follow-up   Medication Refill    HPI Ariel King presents for follow up on hypothyroidism and RAD.  Hypothyroidism: Reports medication compliance. Denies heat/cold intolerance, palpitations or bowel change.  RAD: Has started using Flonase which helps. States yesterday was having some shortness of breath and used her inhaler. Denies using inhaler at night time. No wheezing.  Mood: Patient is followed by psychiatry. Reports has good and bad days. States needs to get back in touch with her therapist.     Past Medical History:  Diagnosis Date   Abnormal Pap smear    Adult hypothyroidism 12/19/2015   Anemia    Anxiety    Bladder disorder    Chronic fatigue and immune dysfunction syndrome (HCC)    Chronic kidney disease    Depression    Depression    EBV infection    H/O bladder infections    H/O joint problems    Hx of migraines    Incontinence    Thyroid disease     Past Surgical History:  Procedure Laterality Date   ROBOTIC ASSISTED LAP VAGINAL HYSTERECTOMY     SYNOVECTOMY WRIST      Family History  Problem Relation Age of Onset   Diabetes Mother    Migraines Mother    Heart attack Mother    Depression Mother    Hypertension Mother    Alcohol abuse Father    Cancer Father        prostate   Depression Sister     Social History   Socioeconomic History   Marital status: Single    Spouse name: Not on file   Number of children: Not on file   Years of education: Not on file   Highest education level: Not on file  Occupational History   Not on file  Tobacco Use   Smoking status: Former    Types: Cigarettes   Smokeless tobacco: Never   Tobacco comments:    quit 10 years ago  Vaping Use   Vaping Use: Never used  Substance and Sexual Activity   Alcohol use: No   Drug  use: No   Sexual activity: Yes    Partners: Male    Birth control/protection: Other-see comments    Comment: hyst  Other Topics Concern   Not on file  Social History Narrative   Not on file   Social Determinants of Health   Financial Resource Strain: Not on file  Food Insecurity: Not on file  Transportation Needs: Not on file  Physical Activity: Not on file  Stress: Not on file  Social Connections: Not on file  Intimate Partner Violence: Not on file    Outpatient Medications Prior to Visit  Medication Sig Dispense Refill   albuterol (VENTOLIN HFA) 108 (90 Base) MCG/ACT inhaler INHALE 1 TO 2 PUFFS INTO THE LUNGS EVERY 4 HOURS AS NEEDED FOR WHEEZING OR SHORTNESS OF BREATH 6.7 g 0   ALPRAZolam (XANAX) 0.5 MG tablet Take 1-2 tablets by mouth every 6 (six) hours as needed.     amphetamine-dextroamphetamine (ADDERALL) 20 MG tablet Take 1 tablet by mouth 3 (three) times daily.     celecoxib (CELEBREX) 200 MG capsule TAKE 1 CAPSULE BY MOUTH TWICE A DAY 180 capsule 0   diclofenac (FLECTOR) 1.3 % PTCH  Place 1 patch onto the skin 2 (two) times daily. 60 patch 1   diclofenac Sodium (VOLTAREN) 1 % GEL APPLY 4 GRAMS TO AFFECTED AREA 4 TIMES DAILY. 100 g 2   dilTIAZem HCl Coated Beads (CARDIZEM CD PO)      DULoxetine (CYMBALTA) 20 MG capsule Take 1 capsule (20 mg total) by mouth daily. 60 capsule 2   Escitalopram Oxalate (LEXAPRO PO)      fluticasone (FLONASE) 50 MCG/ACT nasal spray Place 2 sprays into both nostrils daily. 182 mL 0   ibuprofen (ADVIL) 800 MG tablet Take 1 tablet (800 mg total) by mouth every 8 (eight) hours as needed. 30 tablet 0   levocetirizine (XYZAL) 5 MG tablet TAKE 1 TABLET (5 MG TOTAL) BY MOUTH EVERY EVENING. **PLEASE CONTACT OUR OFFICE TO SCHEDULE A FOLLOW UP FOR FUTURE MED REFILLS** 90 tablet 1   levothyroxine (SYNTHROID) 112 MCG tablet TAKE 1 TABLET (112 MCG TOTAL) BY MOUTH DAILY. **PLEASE CONTACT OUR OFFICE TO SCHEDULE A FOLLOW UP FOR FUTURE MED REFILLS** 15 tablet 0    methocarbamol (ROBAXIN) 500 MG tablet TAKE 1 TABLET (500 MG TOTAL) BY MOUTH NIGHTLY AS NEEDED  3   oxybutynin (DITROPAN XL) 10 MG 24 hr tablet Take 1 tablet (10 mg total) by mouth daily. 30 tablet 2   predniSONE (STERAPRED UNI-PAK 21 TAB) 10 MG (21) TBPK tablet Take as directed (Patient not taking: Reported on 09/27/2021) 21 tablet 0   QUEtiapine (SEROQUEL) 100 MG tablet Take 100 mg by mouth as needed.     topiramate (TOPAMAX) 25 MG tablet Take 1 tablet (25 mg total) by mouth 5 (five) times daily as needed (pain). 150 tablet 3   traZODone (DESYREL) 100 MG tablet Take 3 tablets by mouth at bedtime.      venlafaxine XR (EFFEXOR-XR) 75 MG 24 hr capsule Take 3 capsules by mouth every morning.     gabapentin (NEURONTIN) 400 MG capsule Take 1 capsule (400 mg total) by mouth in the morning, at noon, in the evening, and at bedtime. 120 capsule 0   gabapentin (NEURONTIN) 600 MG tablet TAKE 1 TABLET BY MOUTH 4 TIMES DAILY AS NEEDED. 120 tablet 0   montelukast (SINGULAIR) 10 MG tablet Take 1 tablet (10 mg total) by mouth at bedtime. **PLEASE CONTACT OUR OFFICE TO SCHEDULE A FOLLOW UP FOR FUTURE MED REFILLS** 30 tablet 0   No facility-administered medications prior to visit.    Allergies  Allergen Reactions   Naltrexone Other (See Comments)    Panic attack    ROS Review of Systems Review of Systems:  A fourteen system review of systems was performed and found to be positive as per HPI.   Objective:    Physical Exam General:  Well Developed, well nourished, appropriate for stated age.  Neuro:  Alert and oriented,  extra-ocular muscles intact  HEENT:  Normocephalic, atraumatic, neck supple  Skin:  no gross rash, warm, pink. Cardiac:  RRR, S1 S2 Respiratory:  CTA B/L w/o wheezing, crackles or rales. Vascular:  Ext warm, no cyanosis apprec.; cap RF less 2 sec. Psych:  No HI/SI, judgement and insight good, Euthymic mood. Full Affect.  BP 102/66    Pulse 84    Temp 98.2 F (36.8 C)    Ht 5' 3"   (1.6 m)    Wt 168 lb (76.2 kg)    SpO2 98%    BMI 29.76 kg/m  Wt Readings from Last 3 Encounters:  11/10/21 168 lb (76.2 kg)  09/27/21 168  lb 12.8 oz (76.6 kg)  06/27/21 168 lb 12.8 oz (76.6 kg)     Health Maintenance Due  Topic Date Due   COVID-19 Vaccine (1) Never done   Pneumococcal Vaccine 44-73 Years old (1 - PCV) Never done   COLONOSCOPY (Pts 45-3yr Insurance coverage will need to be confirmed)  Never done   MAMMOGRAM  Never done   Zoster Vaccines- Shingrix (2 of 2) 06/29/2020   INFLUENZA VACCINE  Never done    There are no preventive care reminders to display for this patient.  Lab Results  Component Value Date   TSH 0.138 (L) 11/10/2021   Lab Results  Component Value Date   WBC WILL FOLLOW 11/10/2021   HGB WILL FOLLOW 11/10/2021   HCT WILL FOLLOW 11/10/2021   MCV WILL FOLLOW 11/10/2021   PLT WILL FOLLOW 11/10/2021   Lab Results  Component Value Date   NA 141 11/10/2021   K 4.1 11/10/2021   CO2 21 11/10/2021   GLUCOSE 80 11/10/2021   BUN 8 11/10/2021   CREATININE 0.78 11/10/2021   BILITOT <0.2 11/10/2021   ALKPHOS 73 11/10/2021   AST 16 11/10/2021   ALT 11 11/10/2021   PROT 6.9 11/10/2021   ALBUMIN 4.4 11/10/2021   CALCIUM 8.9 11/10/2021   EGFR 92 11/10/2021   Lab Results  Component Value Date   CHOL 160 04/30/2020   Lab Results  Component Value Date   HDL 62 04/30/2020   Lab Results  Component Value Date   LDLCALC 73 04/30/2020   Lab Results  Component Value Date   TRIG 148 04/30/2020   Lab Results  Component Value Date   CHOLHDL 2.6 04/30/2020   Lab Results  Component Value Date   HGBA1C 5.0 04/30/2020   Depression screen PHQ 2/9 11/10/2021 09/27/2021 01/05/2021 12/15/2020 11/22/2020  Decreased Interest 1 0 1 1 1   Down, Depressed, Hopeless 1 0 1 1 1   PHQ - 2 Score 2 0 2 2 2   Altered sleeping 0 - - 0 1  Tired, decreased energy 0 - - 0 0  Change in appetite 0 - - 1 0  Feeling bad or failure about yourself  1 - - 1 1  Trouble  concentrating - - - 1 1  Moving slowly or fidgety/restless 0 - - 0 0  Suicidal thoughts 0 - - 0 0  PHQ-9 Score 3 - - 5 5  Difficult doing work/chores Extremely dIfficult - - Somewhat difficult -  Some recent data might be hidden   GAD 7 : Generalized Anxiety Score 11/10/2021 05/04/2020 11/05/2019 10/15/2019  Nervous, Anxious, on Edge 3 3 3 3   Control/stop worrying 1 1 3 3   Worry too much - different things 1 2 3 3   Trouble relaxing 1 3 3 3   Restless 1 3 3 1   Easily annoyed or irritable 0 1 1 0  Afraid - awful might happen 0 0 2 3  Total GAD 7 Score 7 13 18 16   Anxiety Difficulty Extremely difficult Very difficult Somewhat difficult Extremely difficult        Assessment & Plan:   Problem List Items Addressed This Visit       Respiratory   Reactive airway disease with wheezing   Relevant Medications   montelukast (SINGULAIR) 10 MG tablet   Other Relevant Orders   Comp Met (CMET) (Completed)   CBC w/Diff (Completed)     Endocrine   Adult hypothyroidism - Primary (Chronic)   Relevant Orders   TSH (  Completed)   T4, free (Completed)   T3 (Completed)   Comp Met (CMET) (Completed)   CBC w/Diff (Completed)     Other   Severe episode of recurrent major depressive disorder, without psychotic features ([place])   Environmental and seasonal allergies   Relevant Medications   montelukast (SINGULAIR) 10 MG tablet   Other Relevant Orders   Comp Met (CMET) (Completed)   CBC w/Diff (Completed)   Severe episode of recurrent major depressive disorder, without psychotic features: -PHQ-9 score of 3, GAD-7 score of 7. -Followed by psychiatry.  Continue current medication regimen. -Recommend to follow-up with therapist.  Adult hypothyroidism: -Last TSH wnl -Continue current medication regimen. -Rechecking thyroid labs today. Pending results will make medication adjustments if indicated.  Reactive airway disease with wheezing: -Stable.  Pulmonary exam normal.  Oxygen saturation 98%  RA. -Recommend to continue montelukast.  Continue albuterol as needed.  Discussed treatment adjustments by starting ICS-LABA if starts using albuterol daily or using 2-3 times per month at nighttime.  Patient verbalized understanding. -Will continue to monitor.  Environmental seasonal allergies: -Stable. -Continue oral antihistamine and montelukast.  Continue Flonase as needed. -Will continue to monitor.   Meds ordered this encounter  Medications   montelukast (SINGULAIR) 10 MG tablet    Sig: Take 1 tablet (10 mg total) by mouth at bedtime.    Dispense:  90 tablet    Refill:  1    Order Specific Question:   Supervising Provider    Answer:   Beatrice Lecher D [[id number]]    Follow-up: Return in about 4 months (around 03/11/2022) for [facility] and [facility] a few days prior .    Lorrene Reid, PA-C

## 2021-11-11 LAB — COMPREHENSIVE METABOLIC PANEL
ALT: 11 IU/L (ref 0–32)
AST: 16 IU/L (ref 0–40)
Albumin/Globulin Ratio: 1.8 (ref 1.2–2.2)
Albumin: 4.4 g/dL (ref 3.8–4.9)
Alkaline Phosphatase: 73 IU/L (ref 44–121)
BUN/Creatinine Ratio: 10 (ref 9–23)
BUN: 8 mg/dL (ref 6–24)
Bilirubin Total: <0.2 mg/dL (ref 0.0–1.2)
CO2: 21 mmol/L (ref 20–29)
Calcium: 8.9 mg/dL (ref 8.7–10.2)
Chloride: 104 mmol/L (ref 96–106)
Creatinine, Ser: 0.78 mg/dL (ref 0.57–1.00)
Globulin, Total: 2.5 g/dL (ref 1.5–4.5)
Glucose: 80 mg/dL (ref 70–99)
Potassium: 4.1 mmol/L (ref 3.5–5.2)
Sodium: 141 mmol/L (ref 134–144)
Total Protein: 6.9 g/dL (ref 6.0–8.5)
eGFR: 92 mL/min/{1.73_m2} (ref >59)

## 2021-11-11 LAB — CBC WITH DIFFERENTIAL/PLATELET

## 2021-11-11 LAB — T4, FREE: Free T4: 1.23 ng/dL (ref 0.82–1.77)

## 2021-11-11 LAB — T3: T3, Total: 108 ng/dL (ref 71–180)

## 2021-11-11 LAB — TSH: TSH: 0.138 u[IU]/mL — ABNORMAL LOW (ref 0.450–4.500)

## 2021-11-19 ENCOUNTER — Other Ambulatory Visit: Payer: Self-pay | Admitting: Physician Assistant

## 2021-11-19 DIAGNOSIS — J4520 Mild intermittent asthma, uncomplicated: Secondary | ICD-10-CM

## 2021-11-19 DIAGNOSIS — J3089 Other allergic rhinitis: Secondary | ICD-10-CM

## 2021-11-25 ENCOUNTER — Other Ambulatory Visit: Payer: Self-pay | Admitting: Sports Medicine

## 2021-11-25 NOTE — Telephone Encounter (Signed)
Please advise 

## 2021-12-05 ENCOUNTER — Other Ambulatory Visit: Payer: Self-pay | Admitting: Physician Assistant

## 2021-12-05 DIAGNOSIS — E039 Hypothyroidism, unspecified: Secondary | ICD-10-CM

## 2021-12-16 ENCOUNTER — Encounter
Payer: Medicare Other | Attending: Physical Medicine and Rehabilitation | Admitting: Physical Medicine and Rehabilitation

## 2021-12-16 ENCOUNTER — Other Ambulatory Visit: Payer: Self-pay

## 2021-12-16 DIAGNOSIS — M79671 Pain in right foot: Secondary | ICD-10-CM | POA: Diagnosis not present

## 2021-12-16 NOTE — Progress Notes (Signed)
Subjective:    Patient ID: Ariel King, female    DOB: [birth year], 51 y.o.   MRN: [id number]  HPI  An audio/video tele-health visit is felt to be the most appropriate encounter for this patient at this time. This is a follow up tele-visit via phone. The patient is at home. MD is at office. Prior to scheduling this appointment, our staff discussed the limitations of evaluation and management by telemedicine and the availability of in-person appointments. The patient expressed understanding and agreed to proceed.   Ariel King is a 51 year old woman who presents for f/u of Achilles tear and lumbago, and right sided cervical radiculiits.  1) Urinary incontinence: -she sometimes urinates in her bed at night.  -it also happens when she coughs.  -+ dysuria.  2) Chronic pain: -the topamax is helping to curb her appetite, but has not yet helped with her pain -she has weaned off the gabapentin Feels good with her biowave treatment -losing balance -has pain radiating from right side of back down into leg.  -changes shoes about 4 or 5 times per day.   3) Cervical radiculitis: She continues to have numbness in her right arm. The arm also feels heavy. We increased her Gabapentin to 4 pills per day and she did find that this helped with her pain. We further increased the dose to 600mg  and she felt that this worsened her sleep. We discussed decreasing the frequency of Gabapentin, and maintaining the dose at 600mg . Will get XR if symptoms worsen.  4) Diet: she got the ghee butter and loves it. She would like to lose weight. Discussed drinking teas and intermittent fasting and she would like to try these ideas.   5) Anxiety: She shared with me her traumatic experience of finding her boyfriend overdosed from cocaine, what a shock it was for her. She still missed him a lot. She has been following with Psychiatry and is very happy with her psychiatrist. She has been trying to use her Xanax less  frequently-4x rather than 8x per day.  -her boy friend has been causing her a lot of anxiety.  -she has a lot on er doing different things in the house  6) Cervical myofasical pain -she has not tried trigger point injections before.   7) Fungal infection between toes -has to change toes frequently -soaks her feet in essential oils  8) Right ankle pain -started hurting her recently.   9) Worsening headaches: -continuing to take the topamax at regular times every day.   10) Loss of balance.    Prior history:  Tramadol was not helping much. Has tried Percocet in the past without much benefit. Continues to take Celecoxib BID without great benefit.  Trigger point injections did not provide benefit.  She would be interested in trying the stem cell patches we discussed last visit.   Prior history: Her dog dug a hole and she tripped in it and fell. This was one year ago. She was treated non-operatively. It was not torn badly enough to not have surgery.  She feels more unstable. She has done PT and did not feel benefit.   Average pains is 9/10, pain right now is 10/10.   Pain Inventory Average Pain 7 Pain Right Now 9 My pain is sharp, burning, stabbing, and tingling  In the last 24 hours, has pain interfered with the following? General activity 6 Relation with others 5 Enjoyment of life 6 What TIME of day is your pain  at its worst? morning , evening, and night Sleep (in general) Fair  Pain is worse with: walking, bending, sitting, standing, and some activites Pain improves with: meds , tens Relief from Meds: 6    Family History  Problem Relation Age of Onset   Diabetes Mother    Migraines Mother    Heart attack Mother    Depression Mother    Hypertension Mother    Alcohol abuse Father    Cancer Father        prostate   Depression Sister    Social History   Socioeconomic History   Marital status: Single    Spouse name: Not on file   Number of children: Not  on file   Years of education: Not on file   Highest education level: Not on file  Occupational History   Not on file  Tobacco Use   Smoking status: Former    Types: Cigarettes   Smokeless tobacco: Never   Tobacco comments:    quit 10 years ago  Vaping Use   Vaping Use: Never used  Substance and Sexual Activity   Alcohol use: No   Drug use: No   Sexual activity: Yes    Partners: Male    Birth control/protection: Other-see comments    Comment: hyst  Other Topics Concern   Not on file  Social History Narrative   Not on file   Social Determinants of Health   Financial Resource Strain: Not on file  Food Insecurity: Not on file  Transportation Needs: Not on file  Physical Activity: Not on file  Stress: Not on file  Social Connections: Not on file   Past Surgical History:  Procedure Laterality Date   ROBOTIC ASSISTED LAP VAGINAL HYSTERECTOMY     SYNOVECTOMY WRIST     Past Medical History:  Diagnosis Date   Abnormal Pap smear    Adult hypothyroidism 12/19/2015   Anemia    Anxiety    Bladder disorder    Chronic fatigue and immune dysfunction syndrome (HCC)    Chronic kidney disease    Depression    Depression    EBV infection    H/O bladder infections    H/O joint problems    Hx of migraines    Incontinence    Thyroid disease    There were no vitals taken for this visit.  Opioid Risk Score:   Fall Risk Score:  `1  Depression screen PHQ 2/9  Depression screen [facility] 2/9 11/10/2021 09/27/2021 01/05/2021 12/15/2020 11/22/2020 09/21/2020 07/28/2020  Decreased Interest 1 0 1 1 1 2 3   Down, Depressed, Hopeless 1 0 1 1 1 2 3   PHQ - 2 Score 2 0 2 2 2 4 6   Altered sleeping 0 - - 0 1 0 3  Tired, decreased energy 0 - - 0 0 1 2  Change in appetite 0 - - 1 0 1 1  Feeling bad or failure about yourself  1 - - 1 1 2 3   Trouble concentrating - - - 1 1 1 3   Moving slowly or fidgety/restless 0 - - 0 0 1 3  Suicidal thoughts 0 - - 0 0 0 0  PHQ-9 Score 3 - - 5 5 10 21   Difficult  doing work/chores Extremely dIfficult - - Somewhat difficult - Very difficult -  Some recent data might be hidden     Review of Systems  Constitutional: Negative.   HENT: Negative.    Eyes: Negative.   Respiratory:  Negative.    Cardiovascular: Negative.   Gastrointestinal: Negative.   Endocrine: Negative.   Genitourinary: Negative.   Musculoskeletal:  Positive for back pain.       Right Shoulder pain Leg pain Feet pain  Skin: Negative.   Allergic/Immunologic: Negative.   Neurological: Negative.   Hematological: Negative.   Psychiatric/Behavioral: Negative.    All other systems reviewed and are negative.     Objective:   Physical Exam Not preformed as patient was seen via phone.     Assessment & Plan:  Ariel King is a 51 year old woman who presents for follow-up of  Achilles tendon tear 1 year ago, as well as with low back and hip pain, and with new and intermittent right sided cervical radiculitis.   1) Chronic Pain Syndrome secondary to right sided Achilles tendon tear, low back and hip pain, right sided cervical radiculitis, and bilateral headaches -Discussed current symptoms of pain and history of pain.  -Discussed benefits of exercise in reducing pain. -Signed pain contract and obtained UDS previously but she did not have great relief from Tramadol in past, so we have stopped this medication and she is doing well off opioids.  -Weaned off Gabapentin to 600mg  TID -Continue Topamax to 25mg  BID.  -encouraged biking.  -continue biowave -Discussed following foods that may reduce pain: 1) Ginger 2) Blueberries 3) Salmon 4) Pumpkin seeds 5) dark chocolate 6) turmeric 7) tart cherries 8) virgin olive oil 9) chilli peppers 10) mint 11) garlic  Link to further information on diet for chronic pain: http://www.randall.com/    -Consider stem cell patches, I have contacted company and unfortunately  we can not get her a free sample. She plans to purchase the patches for one month to try them.  -She loves the [facility]! Continue daily.    2) Right sided SI joint pain -Trigger point injections provided minimal relief, she would like to try repeating next time, next time we will replace normal saline with marcaine.   3) Right sided cervical radiculitis: -Currently without neurological deficits -XR ordered.   4) Obesity: -Lost 13 lbs since I saw her last!! -Discussed benefits of intermittent fasting -Provided with tea recommendations.  -Recommended following podcasts: The [employer] Podcast, [employer] -refilled topamax -has been using Biowave and has been doing it every morning and it makes a big difference.  -Will monitor weight every visit.  -Consider [place] tea daily.  -Discussed the benefits of intermittent fasting. -Discussed foods that can assist in weight loss: 1) leafy greens- high in fiber and nutrients 2) dark chocolate- improves metabolism (if prefer sweetened, best to sweeten with honey instead of sugar).  3) cruciferous vegetables- high in fiber and protein 4) full fat yogurt: high in healthy fat, protein, calcium, and probiotics 5) apples- high in a variety of phytochemicals 6) nuts- high in fiber and protein that increase feelings of fullness 7) grapefruit: rich in nutrients, antioxidants, and fiber (not to be taken with anticoagulation) 8) beans- high in protein and fiber 9) salmon- has high quality protein and healthy fats 10) green tea- rich in polyphenols 11) eggs- rich in choline and vitamin D 12) tuna- high protein, boosts metabolism 13) avocado- decreases visceral abdominal fat 14) chicken (pasture raised): high in protein and iron 15) blueberries- reduce abdominal fat and cholesterol 16) whole grains- decreases calories retained during digestion, speeds metabolism 17) chia seeds- curb appetite 18) chilies- increases fat metabolism  -Discussed  supplements that can be used:  1) Metatrim 400mg  BID  30 minutes before breakfast and dinner  2) Sphaeranthus indicus and Garcinia mangostana (combinations of these and #1 can be found in capsicum and zychrome  3) green coffee bean extract 400mg  twice per day or Irvingia (african mango) 150 to 300mg  twice per day.   5) Dysuria: -UA/UC ordered Recommended timed voiding.   6) Anxiety: -Discussed the following foods that have been show to reduce anxiety: 1) [place] nuts, mushrooms, soy beans due to their high selenium content. Upper limit of toxicity of selenium is 458mcg/day so no more than 3-4 [place] nuts per day.  2) Fatty fish such as salmon, mackerel, sardines, trout, and herring- high in omega-3 fatty acids 3) Eggs- increases serotonin and dopamine 4) Pumpkin seeds- high in omega-3 fatty acids 5) dark chocolate- high in flavanols that increase blood flow to brain 6) turmeric- take with black pepper to increase absorption 7) chamomile tea- antioxidant and anti-inflammatory properties 8) yogurt without sugar- supports gut-brain axis 9) green tea- contains L- theanine 10) blueberries- high in vitamin C and antioxidants 11) [place]- high in tryptophan which gets converted to serotonin 12) bell peppers- rich in vitamin C and antioxidants 13) citrus fruits- rich in vitamin C and antioxidants 14) almonds- high in vitamin E and healthy fats 15) chia seeds- high in omega-3 fatty acids  7) Cervical myofascial pain: -trigger point injections next week.   8) Right ankle pain -XR ordered -recommended to ice 15 minutes three times per day.   5 minutes spent in discussion of ankle pain, getting XR, icing, continuing topamax for migraines, calling with XR results.

## 2021-12-21 ENCOUNTER — Other Ambulatory Visit: Payer: Self-pay

## 2021-12-21 ENCOUNTER — Other Ambulatory Visit: Payer: Medicare Other

## 2021-12-21 ENCOUNTER — Ambulatory Visit (HOSPITAL_COMMUNITY)
Admission: RE | Admit: 2021-12-21 | Discharge: 2021-12-21 | Disposition: A | Discharge status: Home / Self Care | Payer: Medicare Other | Source: Ambulatory Visit | Attending: Physical Medicine and Rehabilitation | Admitting: Physical Medicine and Rehabilitation

## 2021-12-21 DIAGNOSIS — Z0000 Encounter for general adult medical examination without abnormal findings: Secondary | ICD-10-CM

## 2021-12-21 DIAGNOSIS — E039 Hypothyroidism, unspecified: Secondary | ICD-10-CM

## 2021-12-21 DIAGNOSIS — M79671 Pain in right foot: Secondary | ICD-10-CM | POA: Insufficient documentation

## 2021-12-22 LAB — CBC WITH DIFFERENTIAL/PLATELET
Basophils Absolute: 0 10*3/uL (ref 0.0–0.2)
Basos: 1 %
EOS (ABSOLUTE): 0.2 10*3/uL (ref 0.0–0.4)
Eos: 3 %
Hematocrit: 42 % (ref 34.0–46.6)
Hemoglobin: 14.4 g/dL (ref 11.1–15.9)
Immature Grans (Abs): 0 10*3/uL (ref 0.0–0.1)
Immature Granulocytes: 0 %
Lymphocytes Absolute: 2.2 10*3/uL (ref 0.7–3.1)
Lymphs: 30 %
MCH: 31.2 pg (ref 26.6–33.0)
MCHC: 34.3 g/dL (ref 31.5–35.7)
MCV: 91 fL (ref 79–97)
Monocytes Absolute: 0.6 10*3/uL (ref 0.1–0.9)
Monocytes: 9 %
Neutrophils Absolute: 4.3 10*3/uL (ref 1.4–7.0)
Neutrophils: 57 %
Platelets: 246 10*3/uL (ref 150–450)
RBC: 4.62 x10E6/uL (ref 3.77–5.28)
RDW: 12.8 % (ref 11.7–15.4)
WBC: 7.5 10*3/uL (ref 3.4–10.8)

## 2021-12-22 LAB — TSH: TSH: 0.888 u[IU]/mL (ref 0.450–4.500)

## 2021-12-22 LAB — T3: T3, Total: 65 ng/dL — ABNORMAL LOW (ref 71–180)

## 2021-12-22 LAB — T4, FREE: Free T4: 1.13 ng/dL (ref 0.82–1.77)

## 2022-02-10 ENCOUNTER — Other Ambulatory Visit: Payer: Self-pay | Admitting: Physical Medicine and Rehabilitation

## 2022-02-15 ENCOUNTER — Telehealth: Payer: Self-pay | Admitting: Physician Assistant

## 2022-02-15 ENCOUNTER — Encounter: Payer: Self-pay | Admitting: *Deleted

## 2022-02-15 ENCOUNTER — Telehealth: Payer: Self-pay | Admitting: *Deleted

## 2022-02-15 ENCOUNTER — Other Ambulatory Visit: Payer: Self-pay

## 2022-02-15 DIAGNOSIS — R0602 Shortness of breath: Secondary | ICD-10-CM

## 2022-02-15 MED ORDER — ALBUTEROL SULFATE HFA 108 (90 BASE) MCG/ACT IN AERS: INHALATION_SPRAY | RESPIRATORY_TRACT | 0 refills | Status: DC

## 2022-02-15 NOTE — Telephone Encounter (Signed)
Ariel King is calling back about her refill request for Topiramate. I sent a refill request from pharmacy previously. Due to the sig, it will have to wait for Dr Ranell Patrick to be back in the office to prescribe. ?

## 2022-02-15 NOTE — Telephone Encounter (Signed)
Patient requesting refill of her inhaler. Please advise.  ?

## 2022-02-15 NOTE — Telephone Encounter (Signed)
Patient is aware 

## 2022-02-15 NOTE — Telephone Encounter (Signed)
Refill sent.

## 2022-02-23 ENCOUNTER — Other Ambulatory Visit: Payer: Self-pay | Admitting: Sports Medicine

## 2022-02-24 ENCOUNTER — Ambulatory Visit: Payer: Medicare Other | Admitting: Physical Medicine and Rehabilitation

## 2022-03-02 ENCOUNTER — Other Ambulatory Visit: Payer: Self-pay

## 2022-03-02 DIAGNOSIS — Z0000 Encounter for general adult medical examination without abnormal findings: Secondary | ICD-10-CM

## 2022-03-02 DIAGNOSIS — E039 Hypothyroidism, unspecified: Secondary | ICD-10-CM

## 2022-03-02 DIAGNOSIS — Z1329 Encounter for screening for other suspected endocrine disorder: Secondary | ICD-10-CM

## 2022-03-02 DIAGNOSIS — E781 Pure hyperglyceridemia: Secondary | ICD-10-CM

## 2022-03-02 DIAGNOSIS — R739 Hyperglycemia, unspecified: Secondary | ICD-10-CM

## 2022-03-03 ENCOUNTER — Other Ambulatory Visit: Payer: Self-pay | Admitting: Physician Assistant

## 2022-03-03 ENCOUNTER — Other Ambulatory Visit: Payer: Medicare Other

## 2022-03-03 DIAGNOSIS — E039 Hypothyroidism, unspecified: Secondary | ICD-10-CM

## 2022-03-07 ENCOUNTER — Encounter: Payer: Self-pay | Admitting: Physical Medicine and Rehabilitation

## 2022-03-07 ENCOUNTER — Encounter
Payer: Medicare Other | Attending: Physical Medicine and Rehabilitation | Admitting: Physical Medicine and Rehabilitation

## 2022-03-07 VITALS — BP 111/75 | HR 89 | Ht 63.0 in | Wt 163.4 lb

## 2022-03-07 DIAGNOSIS — M7918 Myalgia, other site: Secondary | ICD-10-CM | POA: Diagnosis present

## 2022-03-07 NOTE — Patient Instructions (Signed)
-  recommended doTerra Deep Blue Essential oil and applied to area of pain today- discussed that this is made of natural plant oils. Shared by personal experience of benefit from use of this essential oil ?-provided a link to a pdf of Pete Escogue's musculoskeletal alignment exercises: [email or web address].[email or web address]  ?

## 2022-03-07 NOTE — Progress Notes (Signed)

## 2022-03-09 ENCOUNTER — Encounter: Payer: Medicare Other | Admitting: Physician Assistant

## 2022-03-20 ENCOUNTER — Ambulatory Visit (INDEPENDENT_AMBULATORY_CARE_PROVIDER_SITE_OTHER): Payer: Medicare Other | Admitting: Physician Assistant

## 2022-03-20 ENCOUNTER — Encounter: Payer: Self-pay | Admitting: Physician Assistant

## 2022-03-20 ENCOUNTER — Other Ambulatory Visit: Payer: Self-pay | Admitting: Physician Assistant

## 2022-03-20 VITALS — BP 106/69 | HR 91 | Temp 97.7°F | Ht 64.0 in | Wt 161.0 lb

## 2022-03-20 DIAGNOSIS — Z0000 Encounter for general adult medical examination without abnormal findings: Secondary | ICD-10-CM | POA: Diagnosis not present

## 2022-03-20 DIAGNOSIS — E039 Hypothyroidism, unspecified: Secondary | ICD-10-CM | POA: Diagnosis not present

## 2022-03-20 DIAGNOSIS — M7661 Achilles tendinitis, right leg: Secondary | ICD-10-CM

## 2022-03-20 DIAGNOSIS — M7662 Achilles tendinitis, left leg: Secondary | ICD-10-CM

## 2022-03-20 DIAGNOSIS — Z1231 Encounter for screening mammogram for malignant neoplasm of breast: Secondary | ICD-10-CM

## 2022-03-20 DIAGNOSIS — J3089 Other allergic rhinitis: Secondary | ICD-10-CM

## 2022-03-20 MED ORDER — DICLOFENAC SODIUM 1 % EX GEL: 2.0000 g | Freq: Four times a day (QID) | CUTANEOUS | 1 refills | Status: DC

## 2022-03-20 NOTE — Patient Instructions (Signed)

## 2022-03-20 NOTE — Progress Notes (Signed)
? ?Subjective:  ? Ariel King is a 51 y.o. female who presents for Medicare Annual (Subsequent) preventive examination. ? ?Review of Systems    ?General:   No F/C, wt loss ?Pulm:   No DIB, SOB, pleuritic chest pain ?Card:  No CP, palpitations ?Abd:  No n/v/d or pain ?Ext:  No edema  ? ?Objective:  ?  ?Today's Vitals  ? 03/20/22 1330  ?BP: 106/69  ?Pulse: 91  ?Temp: 97.7 ?F (36.5 ?C)  ?SpO2: 97%  ?Weight: 161 lb (73 kg)  ?Height: '5\' 4"'$  (1.626 m)  ? ?Body mass index is 27.64 kg/m?. ? ? ?  06/20/2017  ? 11:30 AM 05/25/2016  ?  2:33 PM  ?Advanced Directives  ?Does Patient Have a Medical Advance Directive? No No  ?Would patient like information on creating a medical advance directive? Yes (MAU/Ambulatory/Procedural Areas - Information given) No - patient declined information;Yes - Educational materials given  ? ? ?Current Medications (verified) ?Outpatient Encounter Medications as of 03/20/2022  ?Medication Sig  ? albuterol (VENTOLIN HFA) 108 (90 Base) MCG/ACT inhaler INHALE 1 TO 2 PUFFS INTO THE LUNGS EVERY 4 HOURS AS NEEDED FOR WHEEZING OR SHORTNESS OF BREATH  ? ALPRAZolam (XANAX) 0.5 MG tablet Take 1-2 tablets by mouth every 6 (six) hours as needed.  ? amphetamine-dextroamphetamine (ADDERALL) 20 MG tablet Take 1 tablet by mouth 3 (three) times daily.  ? celecoxib (CELEBREX) 200 MG capsule TAKE 1 CAPSULE BY MOUTH TWICE A DAY  ? diclofenac Sodium (VOLTAREN) 1 % GEL Apply 2 g topically 4 (four) times daily.  ? fluticasone (FLONASE) 50 MCG/ACT nasal spray Place 2 sprays into both nostrils daily.  ? ibuprofen (ADVIL) 800 MG tablet Take 1 tablet (800 mg total) by mouth every 8 (eight) hours as needed.  ? levocetirizine (XYZAL) 5 MG tablet TAKE 1 TABLET (5 MG TOTAL) BY MOUTH EVERY EVENING. **PLEASE CONTACT OUR OFFICE TO SCHEDULE A FOLLOW UP FOR FUTURE MED REFILLS**  ? levothyroxine (SYNTHROID) 112 MCG tablet TAKE 1 TABLET (112 MCG TOTAL) BY MOUTH DAILY.  ? methocarbamol (ROBAXIN) 500 MG tablet TAKE 1 TABLET (500 MG TOTAL)  BY MOUTH NIGHTLY AS NEEDED  ? montelukast (SINGULAIR) 10 MG tablet Take 1 tablet (10 mg total) by mouth at bedtime.  ? predniSONE (STERAPRED UNI-PAK 21 TAB) 10 MG (21) TBPK tablet Take as directed  ? QUEtiapine (SEROQUEL) 100 MG tablet Take 100 mg by mouth as needed.  ? topiramate (TOPAMAX) 25 MG tablet TAKE 1 TABLET BY MOUTH 5 TIMES DAILY AS NEEDED (PAIN).  ? traZODone (DESYREL) 100 MG tablet Take 3 tablets by mouth at bedtime.   ? venlafaxine XR (EFFEXOR-XR) 75 MG 24 hr capsule Take 3 capsules by mouth every morning.  ? DULoxetine (CYMBALTA) 20 MG capsule Take 1 capsule (20 mg total) by mouth daily.  ? Escitalopram Oxalate (LEXAPRO PO)   ? [DISCONTINUED] diclofenac (FLECTOR) 1.3 % PTCH Place 1 patch onto the skin 2 (two) times daily. (Patient not taking: Reported on 03/20/2022)  ? [DISCONTINUED] diclofenac Sodium (VOLTAREN) 1 % GEL APPLY 4 GRAMS TO AFFECTED AREA 4 TIMES DAILY. (Patient not taking: Reported on 03/20/2022)  ? [DISCONTINUED] dilTIAZem HCl Coated Beads (CARDIZEM CD PO)  (Patient not taking: Reported on 03/20/2022)  ? ?No facility-administered encounter medications on file as of 03/20/2022.  ? ? ?Allergies (verified) ?Naltrexone  ? ?History: ?Past Medical History:  ?Diagnosis Date  ? Abnormal Pap smear   ? Adult hypothyroidism 12/19/2015  ? Anemia   ? Anxiety   ?  Bladder disorder   ? Chronic fatigue and immune dysfunction syndrome (HCC)   ? Chronic kidney disease   ? Depression   ? Depression   ? EBV infection   ? H/O bladder infections   ? H/O joint problems   ? Hx of migraines   ? Incontinence   ? Thyroid disease   ? ?Past Surgical History:  ?Procedure Laterality Date  ? ROBOTIC ASSISTED LAP VAGINAL HYSTERECTOMY    ? SYNOVECTOMY WRIST    ? ?Family History  ?Problem Relation Age of Onset  ? Diabetes Mother   ? Migraines Mother   ? Heart attack Mother   ? Depression Mother   ? Hypertension Mother   ? Alcohol abuse Father   ? Cancer Father   ?     prostate  ? Depression Sister   ? ?Social History   ? ?Socioeconomic History  ? Marital status: Single  ?  Spouse name: Not on file  ? Number of children: Not on file  ? Years of education: Not on file  ? Highest education level: Not on file  ?Occupational History  ? Not on file  ?Tobacco Use  ? Smoking status: Former  ?  Types: Cigarettes  ? Smokeless tobacco: Never  ? Tobacco comments:  ?  quit 10 years ago  ?Vaping Use  ? Vaping Use: Never used  ?Substance and Sexual Activity  ? Alcohol use: No  ? Drug use: No  ? Sexual activity: Yes  ?  Partners: Male  ?  Birth control/protection: Other-see comments  ?  Comment: hyst  ?Other Topics Concern  ? Not on file  ?Social History Narrative  ? Not on file  ? ?Social Determinants of Health  ? ?Financial Resource Strain: Not on file  ?Food Insecurity: Not on file  ?Transportation Needs: Not on file  ?Physical Activity: Not on file  ?Stress: Not on file  ?Social Connections: Not on file  ? ? ?Tobacco Counseling ?Counseling given: Not Answered ?Tobacco comments: quit 10 years ago ? ? ? ?Diabetic?No ? ? ? ?Activities of Daily Living ? ?  03/20/2022  ?  1:32 PM 11/10/2021  ?  4:07 PM  ?In your present state of health, do you have any difficulty performing the following activities:  ?Hearing? 1 0  ?Vision? 1 0  ?Difficulty concentrating or making decisions? 1 1  ?Walking or climbing stairs? 1 1  ?Dressing or bathing? 1 1  ?Doing errands, shopping? 1 1  ? ? ?Patient Care Team: ?Lorrene Reid, PA-C as PCP - General ?Chucky May, MD as Consulting Physician (Psychiatry) ?Hyatt, Max T, DPM as Veterinary surgeon) ?Maish Vaya Skin as Consulting Physician (Dermatology) ?Cira Servant, DO as Consulting Physician (Rehabilitation) ?Delsa Bern, MD as Consulting Physician (Obstetrics and Gynecology) ?Amalia Greenhouse, MD as Referring Physician (Endocrinology) ? ?Indicate any recent Medical Services you may have received from other than [facility] providers in the past year (date may be approximate). ? ?   ?Assessment:  ?  This is a routine wellness examination for [name]. ? ?Hearing/Vision screen ?No results found. ? ?Dietary issues and exercise activities discussed: ?-Patient has chronic pain syndrome which limits activity level. Following a Mediterranean diet.  ? ? Goals Addressed   ?None ?  ?Depression Screen ? ?  03/20/2022  ?  1:32 PM 03/07/2022  ? 10:43 AM 11/10/2021  ?  4:07 PM 09/27/2021  ? 10:45 AM 01/05/2021  ?  9:10 AM 12/15/2020  ?  1:21 PM 11/22/2020  ?  2:33 PM  ?PHQ 2/9 Scores  ?PHQ - 2 Score '2 2 2 '$ 0 '2 2 2  '$ ?PHQ- 9 Score '9  3   5 5  '$ ?  ?Fall Risk ? ?  03/20/2022  ?  1:32 PM 03/07/2022  ? 10:43 AM 11/10/2021  ?  4:07 PM 09/27/2021  ? 10:45 AM 06/27/2021  ? 10:52 AM  ?Fall Risk   ?Falls in the past year? 1 0 1 0 0  ?Number falls in past yr: 1 0 1 0 0  ?Injury with Fall? 1  0 0 0  ?Risk for fall due to : Impaired balance/gait;Impaired mobility  No Fall Risks    ?Follow up Falls evaluation completed  Falls evaluation completed    ? ? ?FALL RISK PREVENTION PERTAINING TO THE HOME: ? ?Any stairs in or around the home? Yes  ?If so, are there any without handrails? Yes  ?Home free of loose throw rugs in walkways, pet beds, electrical cords, etc? Yes  ?Adequate lighting in your home to reduce risk of falls? Yes  ? ?ASSISTIVE DEVICES UTILIZED TO PREVENT FALLS: ? ?Life alert? No  ?Use of a cane, walker or w/c? No  ?Grab bars in the bathroom? No  ?Shower chair or bench in shower? No  ?Elevated toilet seat or a handicapped toilet? No  ? ?TIMED UP AND GO: ? ?Was the test performed? Yes .  ?Length of time to ambulate 10 feet: 12 sec.  ? ?Gait steady and fast without use of assistive device ? ?Cognitive Function: wnl's ?  ?  ? ?  03/20/2022  ?  1:33 PM 12/15/2020  ?  1:22 PM  ?6CIT Screen  ?What Year? 0 points 0 points  ?What month? 0 points 0 points  ?What time? 0 points 0 points  ?Count back from 20 0 points 0 points  ?Months in reverse 0 points 0 points  ?Repeat phrase 0 points 0 points  ?Total Score 0 points 0 points   ? ? ?Immunizations ?Immunization History  ?Administered Date(s) Administered  ? Tdap 06/15/2014, 01/28/2018  ? Zoster Recombinat (Shingrix) 05/04/2020  ? ? ?Tdap: UTD ? ?Flu Vaccine status: Up to date ? ?Pneumococcal vaccine status: Due, Education

## 2022-03-21 ENCOUNTER — Other Ambulatory Visit: Payer: Medicare Other

## 2022-03-27 ENCOUNTER — Other Ambulatory Visit: Payer: Self-pay | Admitting: Physician Assistant

## 2022-03-27 DIAGNOSIS — E781 Pure hyperglyceridemia: Secondary | ICD-10-CM

## 2022-03-27 DIAGNOSIS — Z0000 Encounter for general adult medical examination without abnormal findings: Secondary | ICD-10-CM

## 2022-03-27 DIAGNOSIS — E039 Hypothyroidism, unspecified: Secondary | ICD-10-CM

## 2022-03-27 DIAGNOSIS — R739 Hyperglycemia, unspecified: Secondary | ICD-10-CM

## 2022-03-27 DIAGNOSIS — E559 Vitamin D deficiency, unspecified: Secondary | ICD-10-CM

## 2022-03-28 ENCOUNTER — Other Ambulatory Visit: Payer: Medicare Other

## 2022-04-28 ENCOUNTER — Encounter
Payer: Medicare Other | Attending: Physical Medicine and Rehabilitation | Admitting: Physical Medicine and Rehabilitation

## 2022-04-28 DIAGNOSIS — G5771 Causalgia of right lower limb: Secondary | ICD-10-CM | POA: Diagnosis not present

## 2022-04-28 MED ORDER — DULOXETINE HCL 20 MG PO CPEP: 20.0000 mg | ORAL_CAPSULE | Freq: Every day | ORAL | 2 refills | Status: DC

## 2022-04-28 NOTE — Progress Notes (Signed)
Subjective:    Patient ID: Ariel King, female    DOB: [birth year], 51 y.o.   MRN: [id number]  HPI  An audio/video tele-health visit is felt to be the most appropriate encounter for this patient at this time. This is a follow up tele-visit via phone. The patient is at home. MD is at office. Prior to scheduling this appointment, our staff discussed the limitations of evaluation and management by telemedicine and the availability of in-person appointments. The patient expressed understanding and agreed to proceed.   Ariel King is a 51 year old woman who presents for f/u Achilles tear and lumbago, and right sided cervical radiculiits.  1) Urinary incontinence: -she sometimes urinates in her bed at night.  -it also happens when she coughs.  -+ dysuria.  2) Chronic pain: -pain in Achilles is severe recently -skin appears purple -sensation has always been decreased -she has tried essential oils -she has not tried Cymbalta -the topamax is helping to curb her appetite, but has not yet helped with her pain -she has weaned off the gabapentin Feels good with her biowave treatment -losing balance -has pain radiating from right side of back down into leg.  -changes shoes about 4 or 5 times per day.   3) Cervical radiculitis: She continues to have numbness in her right arm. The arm also feels heavy. We increased her Gabapentin to 4 pills per day and she did find that this helped with her pain. We further increased the dose to '600mg'$  and she felt that this worsened her sleep. We discussed decreasing the frequency of Gabapentin, and maintaining the dose at '600mg'$ . Will get XR if symptoms worsen.  4) Diet: she got the ghee butter and loves it. She would like to lose weight. Discussed drinking teas and intermittent fasting and she would like to try these ideas.   5) Anxiety: She shared with me her traumatic experience of finding her boyfriend overdosed from cocaine, what a shock it was for her.  She still missed him a lot. She has been following with Psychiatry and is very happy with her psychiatrist. She has been trying to use her Xanax less frequently-4x rather than 8x per day.  -her boy friend has been causing her a lot of anxiety.  -she has a lot on er doing different things in the house  6) Cervical myofasical pain -she has not tried trigger point injections before.   7) Fungal infection between toes -has to change toes frequently -soaks her feet in essential oils  8) Right ankle pain -started hurting her recently.   9) Worsening headaches: -continuing to take the topamax at regular times every day.   10) Loss of balance.    Prior history:  Tramadol was not helping much. Has tried Percocet in the past without much benefit. Continues to take Celecoxib BID without great benefit.  Trigger point injections did not provide benefit.  She would be interested in trying the stem cell patches we discussed last visit.   Prior history: Her dog dug a hole and she tripped in it and fell. This was one year ago. She was treated non-operatively. It was not torn badly enough to not have surgery.  She feels more unstable. She has done PT and did not feel benefit.   Average pains is 9/10, pain right now is 10/10.   Pain Inventory Average Pain 7 Pain Right Now 9 My pain is sharp, burning, stabbing, and tingling  In the last 24 hours, has  pain interfered with the following? General activity 6 Relation with others 5 Enjoyment of life 6 What TIME of day is your pain at its worst? morning , evening, and night Sleep (in general) Fair  Pain is worse with: walking, bending, sitting, standing, and some activites Pain improves with: meds , tens Relief from Meds: 6    Family History  Problem Relation Age of Onset   Diabetes Mother    Migraines Mother    Heart attack Mother    Depression Mother    Hypertension Mother    Alcohol abuse Father    Cancer Father        prostate    Depression Sister    Social History   Socioeconomic History   Marital status: Single    Spouse name: Not on file   Number of children: Not on file   Years of education: Not on file   Highest education level: Not on file  Occupational History   Not on file  Tobacco Use   Smoking status: Former    Types: Cigarettes   Smokeless tobacco: Never   Tobacco comments:    quit 10 years ago  Vaping Use   Vaping Use: Never used  Substance and Sexual Activity   Alcohol use: No   Drug use: No   Sexual activity: Yes    Partners: Male    Birth control/protection: Other-see comments    Comment: hyst  Other Topics Concern   Not on file  Social History Narrative   Not on file   Social Determinants of Health   Financial Resource Strain: Not on file  Food Insecurity: Not on file  Transportation Needs: Not on file  Physical Activity: Not on file  Stress: Not on file  Social Connections: Not on file   Past Surgical History:  Procedure Laterality Date   ROBOTIC ASSISTED LAP VAGINAL HYSTERECTOMY     SYNOVECTOMY WRIST     Past Medical History:  Diagnosis Date   Abnormal Pap smear    Adult hypothyroidism 12/19/2015   Anemia    Anxiety    Bladder disorder    Chronic fatigue and immune dysfunction syndrome (HCC)    Chronic kidney disease    Depression    Depression    EBV infection    H/O bladder infections    H/O joint problems    Hx of migraines    Incontinence    Thyroid disease    There were no vitals taken for this visit.  Opioid Risk Score:   Fall Risk Score:  `1  Depression screen PHQ 2/9     03/20/2022    1:32 PM 03/07/2022   10:43 AM 11/10/2021    4:07 PM 09/27/2021   10:45 AM 01/05/2021    9:10 AM 12/15/2020    1:21 PM 11/22/2020    2:33 PM  Depression screen PHQ 2/9  Decreased Interest '1 1 1 '$ 0 '1 1 1  '$ Down, Depressed, Hopeless '1 1 1 '$ 0 '1 1 1  '$ PHQ - 2 Score '2 2 2 '$ 0 '2 2 2  '$ Altered sleeping 2  0   0 1  Tired, decreased energy 1  0   0 0  Change in appetite 1   0   1 0  Feeling bad or failure about yourself  '2  1   1 1  '$ Trouble concentrating '1     1 1  '$ Moving slowly or fidgety/restless 0  0   0 0  Suicidal thoughts  0  0   0 0  PHQ-9 Score '9  3   5 5  '$ Difficult doing work/chores Very difficult  Extremely dIfficult   Somewhat difficult      Review of Systems  Constitutional: Negative.   HENT: Negative.    Eyes: Negative.   Respiratory: Negative.    Cardiovascular: Negative.   Gastrointestinal: Negative.   Endocrine: Negative.   Genitourinary: Negative.   Musculoskeletal:  Positive for back pain.       Right Shoulder pain Leg pain Feet pain  Skin: Negative.   Allergic/Immunologic: Negative.   Neurological: Negative.   Hematological: Negative.   Psychiatric/Behavioral: Negative.    All other systems reviewed and are negative.      Objective:   Physical Exam Not preformed as patient was seen via phone.     Assessment & Plan:  Ariel King is a 51 year old woman who presents for follow-up of  Achilles tendon tear 1 year ago, as well as with low back and hip pain, and with new and intermittent right sided cervical radiculitis.   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  -Discussed benefits of exercise in reducing pain. -Discussed CRPS -Prescribed Cymbalta '20mg'$  daily -Discussed response to Topamax and how it continues to suppress appetite but does not helpw ith pain -Signed pain contract and obtained UDS previously but she did not have great relief from Tramadol in past, so we have stopped this medication and she is doing well off opioids.  -Weaned off Gabapentin to '600mg'$  TID -Continue Topamax to '25mg'$  BID.  -encouraged biking.  -continue biowave -Discussed following foods that may reduce pain: 1) Ginger 2) Blueberries 3) Salmon 4)  Pumpkin seeds 5) dark chocolate 6) turmeric 7) tart cherries 8) virgin olive oil 9) chilli peppers 10) mint 11) garlic  Link to further information on diet for chronic pain: [email or web address]    -Consider stem cell patches, I have contacted company and unfortunately we can not get her a free sample. She plans to purchase the patches for one month to try them.  -She loves the [facility]! Continue daily.    2) Right sided SI joint pain -Trigger point injections provided minimal relief, she would like to try repeating next time, next time we will replace normal saline with marcaine.   3) Right sided cervical radiculitis: -Currently without neurological deficits -XR ordered.   4) Obesity: -Lost 13 lbs since I saw her last!! -Discussed benefits of intermittent fasting -Provided with tea recommendations.  -Recommended following podcasts: The [employer] Podcast, [employer] -refilled topamax -has been using Biowave and has been doing it every morning and it makes a big difference.  -Will monitor weight every visit.  -Consider [place] tea daily.  -Discussed the benefits of intermittent fasting. -Discussed foods that can assist in weight loss: 1) leafy greens- high in fiber and nutrients 2) dark chocolate- improves metabolism (if prefer sweetened, best to sweeten with honey instead of sugar).  3) cruciferous vegetables- high in fiber and protein 4) full fat yogurt: high in healthy fat, protein, calcium, and probiotics 5) apples- high in a variety of phytochemicals 6) nuts- high in fiber and protein that increase feelings of fullness 7) grapefruit: rich in nutrients, antioxidants, and fiber (not to be taken with anticoagulation) 8) beans- high in protein and fiber 9) salmon-  has high quality protein and healthy fats 10) green tea- rich in polyphenols 11) eggs- rich in choline and vitamin D 12) tuna- high  protein, boosts metabolism 13) avocado- decreases visceral abdominal fat 14) chicken (pasture raised): high in protein and iron 15) blueberries- reduce abdominal fat and cholesterol 16) whole grains- decreases calories retained during digestion, speeds metabolism 17) chia seeds- curb appetite 18) chilies- increases fat metabolism  -Discussed supplements that can be used:  1) Metatrim '400mg'$  BID 30 minutes before breakfast and dinner  2) Sphaeranthus indicus and Garcinia mangostana (combinations of these and #1 can be found in capsicum and zychrome  3) green coffee bean extract '400mg'$  twice per day or Irvingia (african mango) 150 to '300mg'$  twice per day.   5) Dysuria: -UA/UC ordered Recommended timed voiding.   6) Anxiety: -Discussed the following foods that have been show to reduce anxiety: 1) [place] nuts, mushrooms, soy beans due to their high selenium content. Upper limit of toxicity of selenium is 426mg/day so no more than 3-4 bBolivianuts per day.  2) Fatty fish such as salmon, mackerel, sardines, trout, and herring- high in omega-3 fatty acids 3) Eggs- increases serotonin and dopamine 4) Pumpkin seeds- high in omega-3 fatty acids 5) dark chocolate- high in flavanols that increase blood flow to brain 6) turmeric- take with black pepper to increase absorption 7) chamomile tea- antioxidant and anti-inflammatory properties 8) yogurt without sugar- supports gut-brain axis 9) green tea- contains L- theanine 10) blueberries- high in vitamin C and antioxidants 11) [place] high in tryptophan which gets converted to serotonin 12) bell peppers- rich in vitamin C and antioxidants 13) citrus fruits- rich in vitamin C and antioxidants 14) almonds- high in vitamin E and healthy fats 15) chia seeds- high in omega-3 fatty acids  7) Cervical myofascial pain: -trigger point injections next week.   8) Right ankle pain -XR ordered  6 minutes spent discussing her pain, how are symptoms are  suggestive of CRPS, prescribing Cymbalta  -recommended to ice 15 minutes three times per day.   5 minutes spent in discussion of ankle pain, getting XR, icing, continuing topamax for migraines, calling with XR results.

## 2022-05-31 ENCOUNTER — Other Ambulatory Visit: Payer: Self-pay | Admitting: Physician Assistant

## 2022-06-06 ENCOUNTER — Other Ambulatory Visit: Payer: Self-pay | Admitting: Physician Assistant

## 2022-06-06 DIAGNOSIS — E039 Hypothyroidism, unspecified: Secondary | ICD-10-CM

## 2022-06-08 ENCOUNTER — Other Ambulatory Visit: Payer: Self-pay | Admitting: Physician Assistant

## 2022-06-08 DIAGNOSIS — J4520 Mild intermittent asthma, uncomplicated: Secondary | ICD-10-CM

## 2022-06-08 DIAGNOSIS — J3089 Other allergic rhinitis: Secondary | ICD-10-CM

## 2022-06-09 ENCOUNTER — Other Ambulatory Visit: Payer: Self-pay | Admitting: Physical Medicine and Rehabilitation

## 2022-06-13 ENCOUNTER — Ambulatory Visit: Payer: Medicare Other

## 2022-06-17 ENCOUNTER — Other Ambulatory Visit: Payer: Self-pay | Admitting: Physician Assistant

## 2022-06-17 DIAGNOSIS — J4520 Mild intermittent asthma, uncomplicated: Secondary | ICD-10-CM

## 2022-06-17 DIAGNOSIS — J3089 Other allergic rhinitis: Secondary | ICD-10-CM

## 2022-06-17 DIAGNOSIS — E039 Hypothyroidism, unspecified: Secondary | ICD-10-CM

## 2022-06-20 ENCOUNTER — Other Ambulatory Visit: Payer: Self-pay | Admitting: Physician Assistant

## 2022-06-20 DIAGNOSIS — J4520 Mild intermittent asthma, uncomplicated: Secondary | ICD-10-CM

## 2022-06-20 DIAGNOSIS — J3089 Other allergic rhinitis: Secondary | ICD-10-CM

## 2022-06-22 ENCOUNTER — Other Ambulatory Visit: Payer: Self-pay | Admitting: Physician Assistant

## 2022-06-22 DIAGNOSIS — E039 Hypothyroidism, unspecified: Secondary | ICD-10-CM

## 2022-07-13 ENCOUNTER — Encounter: Payer: Medicare Other | Admitting: Physical Medicine and Rehabilitation

## 2022-08-21 ENCOUNTER — Encounter
Payer: Medicare Other | Attending: Physical Medicine and Rehabilitation | Admitting: Physical Medicine and Rehabilitation

## 2022-08-21 DIAGNOSIS — F330 Major depressive disorder, recurrent, mild: Secondary | ICD-10-CM

## 2022-08-21 DIAGNOSIS — S86001A Unspecified injury of right Achilles tendon, initial encounter: Secondary | ICD-10-CM

## 2022-08-21 MED ORDER — VITAMIN D (ERGOCALCIFEROL) 1.25 MG (50000 UNIT) PO CAPS: 50000.0000 [IU] | ORAL_CAPSULE | ORAL | 0 refills | Status: DC

## 2022-08-21 MED ORDER — METHOCARBAMOL 500 MG PO TABS
500.0000 mg | ORAL_TABLET | Freq: Two times a day (BID) | ORAL | 3 refills | Status: DC
Start: 2022-08-21 — End: 2022-08-23

## 2022-08-21 NOTE — Progress Notes (Signed)
Subjective:    Patient ID: Ariel King, female    DOB: Apr 18, 1971, 51 y.o.   MRN: [id number]  HPI  An audio/video tele-health visit is felt to be the most appropriate encounter for this patient at this time. This is a follow up tele-visit via phone. The patient is at home. MD is at office. Prior to scheduling this appointment, our staff discussed the limitations of evaluation and management by telemedicine and the availability of in-person appointments. The patient expressed understanding and agreed to proceed.   Ariel King is a 51 year old woman who presents for f/u Achilles tear and lumbago, and right sided cervical radiculiits.  1) Urinary incontinence: -she sometimes urinates in her bed at night.  -it also happens when she coughs.  -+ dysuria.  2) Chronic pain: -pain in Achilles is severe recently -pain sometimes radiates down from her back -she would like to try extracorporeal shockwave therapy -she prefers natural treatments  -skin appears purple -sensation has always been decreased -she has tried essential oils -she finds the cymbalta helpful -the topamax is helping to curb her appetite, but has not yet helped with her pain -she has weaned off the gabapentin Feels good with her biowave treatment -losing balance -has pain radiating from right side of back down into leg.  -changes shoes about 4 or 5 times per day.   3) Cervical radiculitis: She continues to have numbness in her right arm. The arm also feels heavy. We increased her Gabapentin to 4 pills per day and she did find that this helped with her pain. We further increased the dose to '600mg'$  and she felt that this worsened her sleep. We discussed decreasing the frequency of Gabapentin, and maintaining the dose at '600mg'$ . Will get XR if symptoms worsen.  4) Diet: she got the ghee butter and loves it. She would like to lose weight. Discussed drinking teas and intermittent fasting and she would like to try these ideas.    5) Anxiety: She shared with me her traumatic experience of finding her boyfriend overdosed from cocaine, what a shock it was for her. She still missed him a lot. She has been following with Psychiatry and is very happy with her psychiatrist. She has been trying to use her Xanax less frequently-4x rather than 8x per day.  -her boy friend has been causing her a lot of anxiety.  -she has a lot on er doing different things in the house  6) Cervical myofasical pain -she has not tried trigger point injections before.   7) Fungal infection between toes -has to change toes frequently -soaks her feet in essential oils  8) Right ankle pain -started hurting her recently.   9) Worsening headaches: -continuing to take the topamax at regular times every day.   10) Loss of balance.    Prior history:  Tramadol was not helping much. Has tried Percocet in the past without much benefit. Continues to take Celecoxib BID without great benefit.  Trigger point injections did not provide benefit.  She would be interested in trying the stem cell patches we discussed last visit.   Prior history: Her dog dug a hole and she tripped in it and fell. This was one year ago. She was treated non-operatively. It was not torn badly enough to not have surgery.  She feels more unstable. She has done PT and did not feel benefit.   Average pains is 9/10, pain right now is 10/10.   Pain Inventory Average Pain  7 Pain Right Now 9 My pain is sharp, burning, stabbing, and tingling  In the last 24 hours, has pain interfered with the following? General activity 6 Relation with others 5 Enjoyment of life 6 What TIME of day is your pain at its worst? morning , evening, and night Sleep (in general) Fair  Pain is worse with: walking, bending, sitting, standing, and some activites Pain improves with: meds , tens Relief from Meds: 6    Family History  Problem Relation Age of Onset   Diabetes Mother     Migraines Mother    Heart attack Mother    Depression Mother    Hypertension Mother    Alcohol abuse Father    Cancer Father        prostate   Depression Sister    Social History   Socioeconomic History   Marital status: Single    Spouse name: Not on file   Number of children: Not on file   Years of education: Not on file   Highest education level: Not on file  Occupational History   Not on file  Tobacco Use   Smoking status: Former    Types: Cigarettes   Smokeless tobacco: Never   Tobacco comments:    quit 10 years ago  Vaping Use   Vaping Use: Never used  Substance and Sexual Activity   Alcohol use: No   Drug use: No   Sexual activity: Yes    Partners: Male    Birth control/protection: Other-see comments    Comment: hyst  Other Topics Concern   Not on file  Social History Narrative   Not on file   Social Determinants of Health   Financial Resource Strain: Not on file  Food Insecurity: Not on file  Transportation Needs: Not on file  Physical Activity: Not on file  Stress: Not on file  Social Connections: Not on file   Past Surgical History:  Procedure Laterality Date   ROBOTIC ASSISTED LAP VAGINAL HYSTERECTOMY     SYNOVECTOMY WRIST     Past Medical History:  Diagnosis Date   Abnormal Pap smear    Adult hypothyroidism 12/19/2015   Anemia    Anxiety    Bladder disorder    Chronic fatigue and immune dysfunction syndrome (HCC)    Chronic kidney disease    Depression    Depression    EBV infection    H/O bladder infections    H/O joint problems    Hx of migraines    Incontinence    Thyroid disease    There were no vitals taken for this visit.  Opioid Risk Score:   Fall Risk Score:  `1  Depression screen PHQ 2/9     03/20/2022    1:32 PM 03/07/2022   10:43 AM 11/10/2021    4:07 PM 09/27/2021   10:45 AM 01/05/2021    9:10 AM 12/15/2020    1:21 PM 11/22/2020    2:33 PM  Depression screen PHQ 2/9  Decreased Interest '1 1 1 '$ 0 '1 1 1  '$ Down,  Depressed, Hopeless '1 1 1 '$ 0 '1 1 1  '$ PHQ - 2 Score '2 2 2 '$ 0 '2 2 2  '$ Altered sleeping 2  0   0 1  Tired, decreased energy 1  0   0 0  Change in appetite 1  0   1 0  Feeling bad or failure about yourself  '2  1   1 1  '$ Trouble concentrating 1  1 1  Moving slowly or fidgety/restless 0  0   0 0  Suicidal thoughts 0  0   0 0  PHQ-9 Score '9  3   5 5  '$ Difficult doing work/chores Very difficult  Extremely dIfficult   Somewhat difficult      Review of Systems  Constitutional: Negative.   HENT: Negative.    Eyes: Negative.   Respiratory: Negative.    Cardiovascular: Negative.   Gastrointestinal: Negative.   Endocrine: Negative.   Genitourinary: Negative.   Musculoskeletal:  Positive for back pain.       Right Shoulder pain Leg pain Feet pain  Skin: Negative.   Allergic/Immunologic: Negative.   Neurological: Negative.   Hematological: Negative.   Psychiatric/Behavioral: Negative.    All other systems reviewed and are negative.      Objective:   Physical Exam Not preformed as patient was seen via phone.     Assessment & Plan:  Mrs. Marchiano is a 51 year old woman who presents for follow-up of  Achilles tendon tear 1 year ago, as well as with low back and hip pain, and with new and intermittent right sided cervical radiculitis.   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  -Discussed benefits of exercise in reducing pain. -Discussed CRPS -discussed risks and benefits of extracorporeal shockwave therapy. Discussed that this treatment helps to induce temporary inflammation to promote blood flow and healing -Prescribed Cymbalta '20mg'$  daily Refilled robaxin '500mg'$  BID -Discussed response to Topamax and how it continues to suppress appetite but does not helpw ith pain -Signed pain contract and  obtained UDS previously but she did not have great relief from Tramadol in past, so we have stopped this medication and she is doing well off opioids.  -Weaned off Gabapentin to '600mg'$  TID -Continue Topamax to '25mg'$  BID.  -encouraged biking.  -continue biowave -Discussed following foods that may reduce pain: 1) Ginger 2) Blueberries 3) Salmon 4) Pumpkin seeds 5) dark chocolate 6) turmeric 7) tart cherries 8) virgin olive oil 9) chilli peppers 10) mint 11) garlic  Link to further information on diet for chronic pain: [email or web address].randall.com/    -Consider stem cell patches, I have contacted company and unfortunately we can not get her a free sample. She plans to purchase the patches for one month to try them.  -She loves the [facility]! Continue daily.    2) Right sided SI joint pain -Trigger point injections provided minimal relief, she would like to try repeating next time, next time we will replace normal saline with marcaine.   3) Right sided cervical radiculitis: -Currently without neurological deficits -XR ordered.   4) Obesity: -Lost 13 lbs since I saw her last!! -Discussed benefits of intermittent fasting -Provided with tea recommendations.  -Recommended following podcasts: The [employer] Podcast, [employer] -refilled topamax -has been using Biowave and has been doing it every morning and it makes a big difference.  -Will monitor weight every visit.  -Consider [place] tea daily.  -Discussed the benefits of intermittent fasting. -Discussed foods that can assist in weight loss: 1) leafy greens- high in fiber and nutrients 2) dark chocolate- improves metabolism (if prefer sweetened, best to sweeten with honey instead of sugar).  3) cruciferous vegetables- high in fiber and protein 4) full fat yogurt: high in healthy fat, protein, calcium, and  probiotics 5) apples- high in a variety of  phytochemicals 6) nuts- high in fiber and protein that increase feelings of fullness 7) grapefruit: rich in nutrients, antioxidants, and fiber (not to be taken with anticoagulation) 8) beans- high in protein and fiber 9) salmon- has high quality protein and healthy fats 10) green tea- rich in polyphenols 11) eggs- rich in choline and vitamin D 12) tuna- high protein, boosts metabolism 13) avocado- decreases visceral abdominal fat 14) chicken (pasture raised): high in protein and iron 15) blueberries- reduce abdominal fat and cholesterol 16) whole grains- decreases calories retained during digestion, speeds metabolism 17) chia seeds- curb appetite 18) chilies- increases fat metabolism  -Discussed supplements that can be used:  1) Metatrim '400mg'$  BID 30 minutes before breakfast and dinner  2) Sphaeranthus indicus and Garcinia mangostana (combinations of these and #1 can be found in capsicum and zychrome  3) green coffee bean extract '400mg'$  twice per day or Irvingia (african mango) 150 to '300mg'$  twice per day.   5) Dysuria: -UA/UC ordered Recommended timed voiding.   6) Anxiety: -Discussed the following foods that have been show to reduce anxiety: 1) [place] nuts, mushrooms, soy beans due to their high selenium content. Upper limit of toxicity of selenium is 469mg/day so no more than 3-4 bBolivianuts per day.  2) Fatty fish such as salmon, mackerel, sardines, trout, and herring- high in omega-3 fatty acids 3) Eggs- increases serotonin and dopamine 4) Pumpkin seeds- high in omega-3 fatty acids 5) dark chocolate- high in flavanols that increase blood flow to brain 6) turmeric- take with black pepper to increase absorption 7) chamomile tea- antioxidant and anti-inflammatory properties 8) yogurt without sugar- supports gut-brain axis 9) green tea- contains L- theanine 10) blueberries- high in vitamin C and antioxidants 11) t[place] high in tryptophan which gets converted to serotonin 12)  bell peppers- rich in vitamin C and antioxidants 13) citrus fruits- rich in vitamin C and antioxidants 14) almonds- high in vitamin E and healthy fats 15) chia seeds- high in omega-3 fatty acids  7) Cervical myofascial pain: -trigger point injections as needed  8) Mild depression -prescribed vitamin D supplement  20 minutes spent in discussion of her right sided achilles pain, its severity, the risks and benefits of extracorporeal shockwave therapy, discussed that this treatment helps to induce temporary inflammation to promote blood flow and healing, prescribed vitamin D supplement for mild depression, refilled robaxin '500mg'$  BID

## 2022-08-23 MED ORDER — DULOXETINE HCL 20 MG PO CPEP: 20.0000 mg | ORAL_CAPSULE | Freq: Every day | ORAL | 2 refills | Status: DC

## 2022-08-23 MED ORDER — METHOCARBAMOL 500 MG PO TABS: 500.0000 mg | ORAL_TABLET | Freq: Two times a day (BID) | ORAL | 3 refills | Status: DC

## 2022-08-23 MED ORDER — VITAMIN D (ERGOCALCIFEROL) 1.25 MG (50000 UNIT) PO CAPS: 50000.0000 [IU] | ORAL_CAPSULE | ORAL | 0 refills | Status: DC

## 2022-08-23 NOTE — Addendum Note (Signed)
Addended by: Izora Ribas on: 08/23/2022 05:18 PM   Modules accepted: Orders

## 2022-09-06 ENCOUNTER — Other Ambulatory Visit: Payer: Self-pay | Admitting: Physician Assistant

## 2022-09-06 DIAGNOSIS — J3089 Other allergic rhinitis: Secondary | ICD-10-CM

## 2022-09-06 DIAGNOSIS — J4520 Mild intermittent asthma, uncomplicated: Secondary | ICD-10-CM

## 2022-09-12 ENCOUNTER — Encounter: Payer: Medicare Other | Admitting: Physical Medicine and Rehabilitation

## 2022-09-13 NOTE — Progress Notes (Signed)
This encounter was created in error - please disregard.

## 2022-09-14 ENCOUNTER — Other Ambulatory Visit: Payer: Self-pay | Admitting: Physician Assistant

## 2022-09-14 DIAGNOSIS — J3089 Other allergic rhinitis: Secondary | ICD-10-CM

## 2022-09-29 ENCOUNTER — Other Ambulatory Visit: Payer: Self-pay | Admitting: Physical Medicine and Rehabilitation

## 2022-10-09 ENCOUNTER — Telehealth: Payer: Self-pay

## 2022-10-09 DIAGNOSIS — J3089 Other allergic rhinitis: Secondary | ICD-10-CM

## 2022-10-09 DIAGNOSIS — J4520 Mild intermittent asthma, uncomplicated: Secondary | ICD-10-CM

## 2022-10-09 MED ORDER — MONTELUKAST SODIUM 10 MG PO TABS: 10.0000 mg | ORAL_TABLET | Freq: Every day | ORAL | 0 refills | Status: DC

## 2022-10-09 NOTE — Telephone Encounter (Signed)
Office visit required for further refills

## 2022-10-19 ENCOUNTER — Ambulatory Visit
Admission: RE | Admit: 2022-10-19 | Discharge: 2022-10-19 | Disposition: A | Discharge status: Home / Self Care | Payer: Medicare Other | Source: Ambulatory Visit | Attending: Physical Medicine and Rehabilitation | Admitting: Physical Medicine and Rehabilitation

## 2022-10-19 ENCOUNTER — Encounter: Payer: Self-pay | Admitting: Physical Medicine and Rehabilitation

## 2022-10-19 ENCOUNTER — Encounter: Payer: Self-pay | Attending: Physical Medicine and Rehabilitation | Admitting: Physical Medicine and Rehabilitation

## 2022-10-19 VITALS — BP 119/75 | HR 78 | Ht 64.0 in | Wt 157.0 lb

## 2022-10-19 DIAGNOSIS — M542 Cervicalgia: Secondary | ICD-10-CM | POA: Insufficient documentation

## 2022-10-19 DIAGNOSIS — M79671 Pain in right foot: Secondary | ICD-10-CM | POA: Insufficient documentation

## 2022-10-19 NOTE — Progress Notes (Signed)
1st treatment ECST for plantar fasciitis, right foot  5-10 hx frequency Power level 60-16m 2500 shocks

## 2022-10-26 ENCOUNTER — Encounter: Payer: Medicare Other | Admitting: Physical Medicine and Rehabilitation

## 2022-10-27 ENCOUNTER — Encounter: Payer: Medicare Other | Admitting: Physical Medicine and Rehabilitation

## 2022-11-07 ENCOUNTER — Encounter: Payer: Medicare Other | Admitting: Physical Medicine and Rehabilitation

## 2022-11-09 ENCOUNTER — Encounter: Payer: Medicare Other | Admitting: Physical Medicine and Rehabilitation

## 2022-11-10 ENCOUNTER — Encounter: Payer: Self-pay | Admitting: Physical Medicine and Rehabilitation

## 2022-11-10 ENCOUNTER — Encounter: Payer: Medicare Other | Admitting: Physical Medicine and Rehabilitation

## 2022-11-10 ENCOUNTER — Other Ambulatory Visit: Payer: Self-pay | Admitting: Nurse Practitioner

## 2022-11-10 VITALS — BP 113/78 | HR 82 | Ht 64.0 in | Wt 160.0 lb

## 2022-11-10 DIAGNOSIS — M79671 Pain in right foot: Secondary | ICD-10-CM

## 2022-11-10 DIAGNOSIS — J3089 Other allergic rhinitis: Secondary | ICD-10-CM

## 2022-11-10 MED ORDER — METHOCARBAMOL 750 MG PO TABS: 750.0000 mg | ORAL_TABLET | Freq: Four times a day (QID) | ORAL | 3 refills | Status: DC

## 2022-11-10 NOTE — Progress Notes (Signed)
2nd treatment ECST for plantar fasciitis, right foot  5-10 hx frequency Power level 60-91m 2500 shocks

## 2022-11-16 ENCOUNTER — Encounter: Payer: 59 | Attending: Physical Medicine and Rehabilitation | Admitting: Physical Medicine and Rehabilitation

## 2022-11-16 ENCOUNTER — Encounter: Payer: Self-pay | Admitting: Physical Medicine and Rehabilitation

## 2022-11-16 ENCOUNTER — Ambulatory Visit: Payer: Medicare Other | Admitting: Physical Medicine and Rehabilitation

## 2022-11-16 VITALS — BP 115/74 | HR 77 | Ht 64.0 in | Wt 164.0 lb

## 2022-11-16 DIAGNOSIS — S86001A Unspecified injury of right Achilles tendon, initial encounter: Secondary | ICD-10-CM | POA: Insufficient documentation

## 2022-11-16 DIAGNOSIS — M5431 Sciatica, right side: Secondary | ICD-10-CM | POA: Insufficient documentation

## 2022-11-16 DIAGNOSIS — M722 Plantar fascial fibromatosis: Secondary | ICD-10-CM

## 2022-11-16 NOTE — Progress Notes (Signed)
3rd treatment ECST for plantar fasciitis, right foot  5-10 hx frequency Power level 60-22m 2500 shocks

## 2022-11-23 ENCOUNTER — Encounter: Payer: Self-pay | Admitting: Physical Medicine and Rehabilitation

## 2022-11-23 ENCOUNTER — Encounter: Payer: 59 | Attending: Physical Medicine and Rehabilitation | Admitting: Physical Medicine and Rehabilitation

## 2022-11-23 VITALS — BP 105/72 | HR 92 | Ht 64.0 in | Wt 161.0 lb

## 2022-11-23 DIAGNOSIS — M722 Plantar fascial fibromatosis: Secondary | ICD-10-CM

## 2022-11-23 NOTE — Progress Notes (Signed)
4th treatment ECST for plantar fasciitis, right foot  5-10 hx frequency Power level 60-75m 2500 shocks

## 2022-11-23 NOTE — Patient Instructions (Signed)
4th treatment ECST for plantar fasciitis, right foot  5-10 hx frequency Power level 60-44m 2500 shocks

## 2022-11-24 ENCOUNTER — Ambulatory Visit: Payer: Medicare Other | Admitting: Physical Medicine and Rehabilitation

## 2022-11-29 ENCOUNTER — Ambulatory Visit (HOSPITAL_BASED_OUTPATIENT_CLINIC_OR_DEPARTMENT_OTHER): Payer: BC Managed Care – PPO | Attending: Physical Medicine and Rehabilitation | Admitting: Physical Therapy

## 2022-11-30 ENCOUNTER — Encounter: Payer: 59 | Admitting: Physical Medicine and Rehabilitation

## 2022-12-01 ENCOUNTER — Ambulatory Visit (HOSPITAL_BASED_OUTPATIENT_CLINIC_OR_DEPARTMENT_OTHER): Payer: BC Managed Care – PPO | Admitting: Physical Therapy

## 2022-12-01 ENCOUNTER — Other Ambulatory Visit: Payer: Self-pay | Admitting: Nurse Practitioner

## 2022-12-01 DIAGNOSIS — E039 Hypothyroidism, unspecified: Secondary | ICD-10-CM

## 2022-12-05 ENCOUNTER — Encounter: Payer: 59 | Admitting: Physical Medicine and Rehabilitation

## 2022-12-05 ENCOUNTER — Encounter: Payer: Self-pay | Admitting: Physical Medicine and Rehabilitation

## 2022-12-05 VITALS — BP 124/84 | HR 85 | Ht 64.0 in | Wt 157.0 lb

## 2022-12-05 DIAGNOSIS — M5431 Sciatica, right side: Secondary | ICD-10-CM

## 2022-12-05 DIAGNOSIS — M722 Plantar fascial fibromatosis: Secondary | ICD-10-CM

## 2022-12-05 DIAGNOSIS — S86001A Unspecified injury of right Achilles tendon, initial encounter: Secondary | ICD-10-CM

## 2022-12-05 NOTE — Progress Notes (Signed)
5th treatment ECST for plantar fasciitis, right foot, right sided sciatica.   5-10 hx frequency Power level 60-80m 2500 shocks

## 2022-12-06 ENCOUNTER — Telehealth: Payer: Self-pay | Admitting: Nurse Practitioner

## 2022-12-06 DIAGNOSIS — E039 Hypothyroidism, unspecified: Secondary | ICD-10-CM

## 2022-12-06 NOTE — Telephone Encounter (Signed)
Refills will be addressed during next OV.  

## 2022-12-06 NOTE — Telephone Encounter (Signed)
Pt is scheduled for tomorrow at 3:10

## 2022-12-06 NOTE — Telephone Encounter (Signed)
L.O.V: 03/20/22  N.O.V: not scheduled   L.R.F: Levothyroxine 06/23/22 90 tab 0 refill    Denied. Office visit required

## 2022-12-07 ENCOUNTER — Ambulatory Visit: Payer: BC Managed Care – PPO | Admitting: Nurse Practitioner

## 2022-12-07 NOTE — Progress Notes (Deleted)
Established patient visit   Patient: Ariel King   DOB: 05-May-1971   52 y.o. Female  MRN: [id number] Visit Date: 12/07/2022   No chief complaint on file.  Subjective    HPI  Follow up Hypothyroid GAD - sees psychiatry  Asthma - generally well managed  Allergies     Medications: Outpatient Medications Prior to Visit  Medication Sig   albuterol (VENTOLIN HFA) 108 (90 Base) MCG/ACT inhaler INHALE 1 TO 2 PUFFS INTO THE LUNGS EVERY 4 HOURS AS NEEDED FOR WHEEZING OR SHORTNESS OF BREATH   ALPRAZolam (XANAX) 0.5 MG tablet Take 1-2 tablets by mouth every 6 (six) hours as needed.   ALPRAZolam (XANAX) 1 MG tablet Take by mouth.   amphetamine-dextroamphetamine (ADDERALL) 20 MG tablet Take 1 tablet by mouth 3 (three) times daily.   celecoxib (CELEBREX) 200 MG capsule TAKE 1 CAPSULE BY MOUTH TWICE A DAY   diclofenac Sodium (VOLTAREN) 1 % GEL Apply 2 g topically 4 (four) times daily.   DULoxetine (CYMBALTA) 20 MG capsule Take 1 capsule (20 mg total) by mouth daily.   Escitalopram Oxalate (LEXAPRO PO)    fluticasone (FLONASE) 50 MCG/ACT nasal spray Place 2 sprays into both nostrils daily.   ibuprofen (ADVIL) 800 MG tablet Take 1 tablet (800 mg total) by mouth every 8 (eight) hours as needed.   levocetirizine (XYZAL) 5 MG tablet TAKE 1 TABLET BY MOUTH EVERY EVENING. **PLEASE CONTACT OUR OFFICE TO SCHEDULE A FOLLOW UP FOR FUTURE MED REFILLS**   levothyroxine (SYNTHROID) 112 MCG tablet TAKE 1 TABLET (112 MCG TOTAL) BY MOUTH DAILY.   methocarbamol (ROBAXIN-750) 750 MG tablet Take 1 tablet (750 mg total) by mouth 4 (four) times daily.   montelukast (SINGULAIR) 10 MG tablet Take 1 tablet (10 mg total) by mouth at bedtime.   predniSONE (STERAPRED UNI-PAK 21 TAB) 10 MG (21) TBPK tablet Take as directed   QUEtiapine (SEROQUEL) 100 MG tablet Take 100 mg by mouth as needed.   QUEtiapine Fumarate (SEROQUEL XR) 150 MG 24 hr tablet Take 150 mg by mouth at bedtime.   topiramate (TOPAMAX) 25 MG tablet  TAKE 1 TABLET BY MOUTH FIVE TIMES DAILY AS NEEDED PAIN.   traZODone (DESYREL) 100 MG tablet Take 3 tablets by mouth at bedtime.    venlafaxine XR (EFFEXOR-XR) 150 MG 24 hr capsule Take 300 mg by mouth daily.   venlafaxine XR (EFFEXOR-XR) 75 MG 24 hr capsule Take 3 capsules by mouth every morning.   Vitamin D, Ergocalciferol, (DRISDOL) 1.25 MG (50000 UNIT) CAPS capsule Take 1 capsule (50,000 Units total) by mouth every 7 (seven) days.   No facility-administered medications prior to visit.    Review of Systems  {Labs (Optional):23779}   Objective    There were no vitals filed for this visit. There is no height or weight on file to calculate BMI.  BP Readings from Last 3 Encounters:  12/05/22 124/84  11/23/22 105/72  11/16/22 115/74    Wt Readings from Last 3 Encounters:  12/05/22 157 lb (71.2 kg)  11/23/22 161 lb (73 kg)  11/16/22 164 lb (74.4 kg)    Physical Exam  ***  No results found for any visits on 12/07/22.  Assessment & Plan     Problem List Items Addressed This Visit   None    No follow-ups on file.         Ronnell Freshwater, NP  [facility] [phone] (phone) [phone] (fax)  [place]

## 2022-12-11 ENCOUNTER — Telehealth: Payer: Self-pay | Admitting: *Deleted

## 2022-12-11 ENCOUNTER — Other Ambulatory Visit: Payer: Self-pay | Admitting: Nurse Practitioner

## 2022-12-11 DIAGNOSIS — E039 Hypothyroidism, unspecified: Secondary | ICD-10-CM

## 2022-12-11 DIAGNOSIS — R0602 Shortness of breath: Secondary | ICD-10-CM

## 2022-12-11 MED ORDER — ALBUTEROL SULFATE HFA 108 (90 BASE) MCG/ACT IN AERS: INHALATION_SPRAY | RESPIRATORY_TRACT | 0 refills | Status: DC

## 2022-12-11 MED ORDER — LEVOTHYROXINE SODIUM 112 MCG PO TABS: 112.0000 ug | ORAL_TABLET | Freq: Every day | ORAL | 0 refills | Status: DC

## 2022-12-11 NOTE — Telephone Encounter (Signed)
I have refilled these and sent to [facility]

## 2022-12-11 NOTE — Telephone Encounter (Signed)
Pt informed of below.Ariel King, CMA ? ?

## 2022-12-11 NOTE — Telephone Encounter (Signed)
Pt calling requesting refills on below to last until appointment scheduled on 01/09/23, pt will be having FBW per last visit note on 12/14/22.       levothyroxine (SYNTHROID) 112 MCG tablet   albuterol (VENTOLIN HFA) 108 (90 Base) MCG/ACT inhaler    [facility] - [place], [place] - [place]

## 2022-12-12 ENCOUNTER — Encounter: Payer: 59 | Admitting: Physical Medicine and Rehabilitation

## 2022-12-12 ENCOUNTER — Encounter: Payer: Self-pay | Admitting: Physical Medicine and Rehabilitation

## 2022-12-12 VITALS — BP 109/73 | HR 92 | Temp 98.6°F | Ht 64.0 in | Wt 155.0 lb

## 2022-12-12 DIAGNOSIS — M5431 Sciatica, right side: Secondary | ICD-10-CM

## 2022-12-12 NOTE — Progress Notes (Signed)
2nd treatment right sided sciatica.   5-10 hx frequency Power level 60-71m 2500 shocks

## 2022-12-14 ENCOUNTER — Other Ambulatory Visit: Payer: BC Managed Care – PPO

## 2022-12-14 DIAGNOSIS — E039 Hypothyroidism, unspecified: Secondary | ICD-10-CM

## 2022-12-15 LAB — T4, FREE: Free T4: 1.21 ng/dL (ref 0.82–1.77)

## 2022-12-19 ENCOUNTER — Encounter: Payer: Self-pay | Admitting: Physical Medicine and Rehabilitation

## 2022-12-19 ENCOUNTER — Encounter: Payer: 59 | Attending: Physical Medicine and Rehabilitation | Admitting: Physical Medicine and Rehabilitation

## 2022-12-19 VITALS — BP 121/79 | HR 91 | Ht 64.0 in | Wt 164.0 lb

## 2022-12-19 DIAGNOSIS — S86001A Unspecified injury of right Achilles tendon, initial encounter: Secondary | ICD-10-CM | POA: Insufficient documentation

## 2022-12-19 DIAGNOSIS — M79671 Pain in right foot: Secondary | ICD-10-CM | POA: Insufficient documentation

## 2022-12-19 MED ORDER — METHOCARBAMOL 750 MG PO TABS: 750.0000 mg | ORAL_TABLET | Freq: Four times a day (QID) | ORAL | 3 refills | Status: DC

## 2022-12-19 MED ORDER — TOPIRAMATE 25 MG PO TABS: 25.0000 mg | ORAL_TABLET | Freq: Every day | ORAL | 3 refills | Status: DC | PRN

## 2022-12-19 MED ORDER — DULOXETINE HCL 20 MG PO CPEP: 20.0000 mg | ORAL_CAPSULE | Freq: Every day | ORAL | 2 refills | Status: DC

## 2022-12-19 NOTE — Progress Notes (Signed)
3rd treatment right sided sciatica/trochanteric bursitis  5-10 hx frequency Power level 60-77m 2500 shocks

## 2022-12-26 ENCOUNTER — Encounter: Payer: 59 | Admitting: Physical Medicine and Rehabilitation

## 2023-01-02 ENCOUNTER — Encounter: Payer: 59 | Admitting: Physical Medicine and Rehabilitation

## 2023-01-02 ENCOUNTER — Encounter: Payer: Self-pay | Admitting: Physical Medicine and Rehabilitation

## 2023-01-02 VITALS — BP 114/79 | HR 95 | Ht 64.0 in | Wt 164.4 lb

## 2023-01-02 DIAGNOSIS — S86001A Unspecified injury of right Achilles tendon, initial encounter: Secondary | ICD-10-CM

## 2023-01-02 NOTE — Progress Notes (Signed)
6th treatment ECST for right achillodynia  5-10 hx frequency Power level 60-110m 2500 shocks

## 2023-01-05 ENCOUNTER — Telehealth: Payer: Self-pay | Admitting: *Deleted

## 2023-01-06 ENCOUNTER — Other Ambulatory Visit: Payer: Self-pay | Admitting: Nurse Practitioner

## 2023-01-06 DIAGNOSIS — J3089 Other allergic rhinitis: Secondary | ICD-10-CM

## 2023-01-08 ENCOUNTER — Other Ambulatory Visit: Payer: Self-pay | Admitting: *Deleted

## 2023-01-08 NOTE — Telephone Encounter (Signed)
Opened in error.Ariel King, CMA  

## 2023-01-09 ENCOUNTER — Ambulatory Visit: Payer: BC Managed Care – PPO | Admitting: Nurse Practitioner

## 2023-01-09 ENCOUNTER — Encounter: Payer: 59 | Admitting: Physical Medicine and Rehabilitation

## 2023-01-09 NOTE — Telephone Encounter (Signed)
Erroneous encounter

## 2023-01-11 ENCOUNTER — Encounter: Payer: 59 | Admitting: Physical Medicine and Rehabilitation

## 2023-01-16 ENCOUNTER — Encounter: Payer: 59 | Attending: Physical Medicine and Rehabilitation | Admitting: Physical Medicine and Rehabilitation

## 2023-01-16 VITALS — BP 117/76 | HR 88 | Ht 64.0 in | Wt 162.0 lb

## 2023-01-16 DIAGNOSIS — M5431 Sciatica, right side: Secondary | ICD-10-CM | POA: Insufficient documentation

## 2023-01-16 NOTE — Progress Notes (Signed)
1st treatment ECST for right sided sciatic  5-10 hx frequency Power level 60-83m 2500 shocks

## 2023-01-16 NOTE — Patient Instructions (Signed)
Ariel King.[email or web address]

## 2023-01-23 ENCOUNTER — Encounter: Payer: 59 | Admitting: Physical Medicine and Rehabilitation

## 2023-01-23 ENCOUNTER — Other Ambulatory Visit: Payer: Self-pay

## 2023-01-23 ENCOUNTER — Telehealth: Payer: Self-pay | Admitting: *Deleted

## 2023-01-23 MED ORDER — DULOXETINE HCL 20 MG PO CPEP: 20.0000 mg | ORAL_CAPSULE | Freq: Every day | ORAL | 2 refills | Status: DC

## 2023-01-23 MED ORDER — METHOCARBAMOL 750 MG PO TABS: 750.0000 mg | ORAL_TABLET | Freq: Four times a day (QID) | ORAL | 3 refills | Status: DC

## 2023-01-23 MED ORDER — TOPIRAMATE 25 MG PO TABS: 25.0000 mg | ORAL_TABLET | Freq: Every day | ORAL | 3 refills | Status: DC | PRN

## 2023-01-23 NOTE — Telephone Encounter (Signed)
Returned call to [employer] confirmed Duloxetine20 added and physician aware of Effexor XR patient already taking.

## 2023-01-23 NOTE — Telephone Encounter (Signed)
[facility] calling re: Venlafaxine XR 150  and Duloxetine 20 mg Duplicate therapy. Should patient be on both?

## 2023-01-27 ENCOUNTER — Other Ambulatory Visit: Payer: Self-pay | Admitting: Nurse Practitioner

## 2023-01-27 DIAGNOSIS — J3089 Other allergic rhinitis: Secondary | ICD-10-CM

## 2023-01-27 DIAGNOSIS — J4520 Mild intermittent asthma, uncomplicated: Secondary | ICD-10-CM

## 2023-01-29 ENCOUNTER — Ambulatory Visit: Payer: BC Managed Care – PPO | Admitting: Family Medicine

## 2023-01-29 NOTE — Progress Notes (Deleted)
   Established Patient Office Visit  Subjective   Patient ID: Ariel King, female    DOB: 14-Jul-1971  Age: 52 y.o. MRN: [id number]  No chief complaint on file.   HPI Ariel King is a 52 y.o. female presenting today for follow up of ***.  ROS Negative unless otherwise noted in HPI   Objective:     There were no vitals taken for this visit.  Physical Exam   No results found for any visits on 01/29/23.  {Labs (Optional):23779}  The 10-year ASCVD risk score (Arnett DK, et al., 2019) is: 0.8%    Assessment & Plan:  There are no diagnoses linked to this encounter.  No follow-ups on file.    Velva Harman, PA

## 2023-02-01 ENCOUNTER — Encounter: Payer: 59 | Admitting: Physical Medicine and Rehabilitation

## 2023-02-08 ENCOUNTER — Encounter: Payer: 59 | Admitting: Physical Medicine and Rehabilitation

## 2023-02-08 ENCOUNTER — Telehealth: Payer: Self-pay | Admitting: *Deleted

## 2023-02-08 NOTE — Telephone Encounter (Signed)
A warning letter has been sent via [place] patient of missed appointments and any further cancel/no shows will result in discharge from the clinic.

## 2023-03-14 ENCOUNTER — Other Ambulatory Visit: Payer: Self-pay | Admitting: Nurse Practitioner

## 2023-03-14 ENCOUNTER — Other Ambulatory Visit: Payer: Self-pay | Admitting: Physical Medicine and Rehabilitation

## 2023-03-14 DIAGNOSIS — E039 Hypothyroidism, unspecified: Secondary | ICD-10-CM

## 2023-03-21 ENCOUNTER — Other Ambulatory Visit: Payer: Self-pay | Admitting: Physical Medicine and Rehabilitation

## 2023-03-21 ENCOUNTER — Other Ambulatory Visit: Payer: Self-pay | Admitting: Nurse Practitioner

## 2023-03-21 DIAGNOSIS — J45909 Unspecified asthma, uncomplicated: Secondary | ICD-10-CM

## 2023-03-21 DIAGNOSIS — R0602 Shortness of breath: Secondary | ICD-10-CM

## 2023-03-23 ENCOUNTER — Other Ambulatory Visit: Payer: Self-pay | Admitting: Physical Medicine and Rehabilitation

## 2023-03-28 ENCOUNTER — Telehealth: Payer: Self-pay

## 2023-03-28 DIAGNOSIS — J3089 Other allergic rhinitis: Secondary | ICD-10-CM

## 2023-03-28 MED ORDER — LEVOCETIRIZINE DIHYDROCHLORIDE 5 MG PO TABS
ORAL_TABLET | ORAL | 0 refills | Status: DC
Start: 2023-03-28 — End: 2023-11-01

## 2023-03-28 NOTE — Telephone Encounter (Signed)
Refill sent.

## 2023-03-28 NOTE — Telephone Encounter (Signed)
Pt is requesting refill to get her to the appt  levothyroxine (SYNTHROID) 112 MCG tablet  levocetirizine (XYZAL) 5 MG tablet   Pharmacy:  [facility] - [place], [place] - [address]   Pt was notified on 03/14/23 that an appt was needed for additional refills and just called back to once she ran out for the refill

## 2023-03-29 ENCOUNTER — Other Ambulatory Visit: Payer: Self-pay | Admitting: Nurse Practitioner

## 2023-03-29 DIAGNOSIS — R0602 Shortness of breath: Secondary | ICD-10-CM

## 2023-03-29 DIAGNOSIS — J45909 Unspecified asthma, uncomplicated: Secondary | ICD-10-CM

## 2023-04-04 ENCOUNTER — Encounter: Payer: Self-pay | Admitting: Family Medicine

## 2023-04-04 ENCOUNTER — Ambulatory Visit (INDEPENDENT_AMBULATORY_CARE_PROVIDER_SITE_OTHER): Payer: 59 | Admitting: Family Medicine

## 2023-04-04 VITALS — BP 101/70 | HR 88 | Resp 18 | Ht 64.0 in | Wt 163.0 lb

## 2023-04-04 DIAGNOSIS — J4520 Mild intermittent asthma, uncomplicated: Secondary | ICD-10-CM | POA: Diagnosis not present

## 2023-04-04 DIAGNOSIS — J3089 Other allergic rhinitis: Secondary | ICD-10-CM

## 2023-04-04 DIAGNOSIS — M7662 Achilles tendinitis, left leg: Secondary | ICD-10-CM

## 2023-04-04 DIAGNOSIS — E038 Other specified hypothyroidism: Secondary | ICD-10-CM

## 2023-04-04 DIAGNOSIS — Z1231 Encounter for screening mammogram for malignant neoplasm of breast: Secondary | ICD-10-CM | POA: Diagnosis not present

## 2023-04-04 DIAGNOSIS — F4329 Adjustment disorder with other symptoms: Secondary | ICD-10-CM

## 2023-04-04 DIAGNOSIS — M7661 Achilles tendinitis, right leg: Secondary | ICD-10-CM

## 2023-04-04 MED ORDER — ALBUTEROL SULFATE HFA 108 (90 BASE) MCG/ACT IN AERS
INHALATION_SPRAY | RESPIRATORY_TRACT | 0 refills | Status: AC
Start: 2023-04-04 — End: ?

## 2023-04-04 MED ORDER — MONTELUKAST SODIUM 10 MG PO TABS
10.0000 mg | ORAL_TABLET | Freq: Every day | ORAL | 1 refills | Status: DC
Start: 2023-04-04 — End: 2024-07-29

## 2023-04-04 MED ORDER — LEVOTHYROXINE SODIUM 112 MCG PO TABS
112.0000 ug | ORAL_TABLET | Freq: Every day | ORAL | 1 refills | Status: DC
Start: 2023-04-04 — End: 2023-09-26

## 2023-04-04 MED ORDER — FLUTICASONE PROPIONATE 50 MCG/ACT NA SUSP
2.0000 | Freq: Every day | NASAL | 0 refills | Status: DC
Start: 2023-04-04 — End: 2024-07-29

## 2023-04-04 MED ORDER — DICLOFENAC SODIUM 1 % EX GEL
2.0000 g | Freq: Four times a day (QID) | CUTANEOUS | 1 refills | Status: AC
Start: 2023-04-04 — End: ?
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
## 2023-04-04 NOTE — Assessment & Plan Note (Signed)
Last TSH within normal limits.  Continue levothyroxine 112 mcg daily.  Will recheck TSH and T4 with annual physical.

## 2023-04-04 NOTE — Assessment & Plan Note (Signed)
Continue Xyzal 5 mg daily for seasonal allergies.

## 2023-04-04 NOTE — Assessment & Plan Note (Signed)
Provided education on regimen.  We discussed that montelukast should be taken daily as maintenance medication and albuterol inhaler should be reserved for rescue.  Patient will restart montelukast 10 mg daily.  We will reassess at her annual physical to ensure that this is enough for maintenance medicine.  Continue albuterol inhaler for rescue.

## 2023-04-04 NOTE — Patient Instructions (Addendum)
Montelukast is a maintenance medicine for your asthma to prevent it from flaring. Albuterol inhaler as a rescue medicine for your asthma when it flares.  Our goal is for you to use this as infrequently as possible by getting better control with maintenance medicines. Xyzal is for seasonal allergies, and you can take at the same time as montelukast.  Website to find a therapist: Psychology Today Find a Therapist https://[email or web address].com/us/[email or web address] -Let me know if you do need a referral and I would be happy to send it in
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
## 2023-04-19 ENCOUNTER — Telehealth: Payer: Self-pay | Admitting: *Deleted

## 2023-04-19 NOTE — Telephone Encounter (Signed)
LVM for pt to call office, we have cancelled her appointment on Monday which was scheduled for a physical.  She is also scheduled to have a physical in August with labs.  So if she doesn't need to be seen for anything then she can just come in for the appointment in August.  But if she needs to be seen for something else we can reschedule her for that to be seen sooner. Please assist her if she calls back.

## 2023-04-23 ENCOUNTER — Encounter: Payer: BC Managed Care – PPO | Admitting: Family Medicine

## 2023-05-02 ENCOUNTER — Ambulatory Visit: Payer: BC Managed Care – PPO

## 2023-06-01 DIAGNOSIS — H6121 Impacted cerumen, right ear: Secondary | ICD-10-CM | POA: Diagnosis not present

## 2023-06-01 DIAGNOSIS — H6691 Otitis media, unspecified, right ear: Secondary | ICD-10-CM | POA: Diagnosis not present

## 2023-06-20 ENCOUNTER — Other Ambulatory Visit: Payer: Self-pay | Admitting: Physical Medicine and Rehabilitation

## 2023-06-21 ENCOUNTER — Other Ambulatory Visit: Payer: Self-pay | Admitting: Family Medicine

## 2023-06-21 DIAGNOSIS — G9332 Myalgic encephalomyelitis/chronic fatigue syndrome: Secondary | ICD-10-CM

## 2023-06-21 DIAGNOSIS — Z130 Encounter for screening for diseases of the blood and blood-forming organs and certain disorders involving the immune mechanism: Secondary | ICD-10-CM

## 2023-06-21 DIAGNOSIS — E039 Hypothyroidism, unspecified: Secondary | ICD-10-CM

## 2023-06-21 DIAGNOSIS — Z0000 Encounter for general adult medical examination without abnormal findings: Secondary | ICD-10-CM

## 2023-06-21 DIAGNOSIS — E781 Pure hyperglyceridemia: Secondary | ICD-10-CM

## 2023-06-25 ENCOUNTER — Ambulatory Visit: Payer: BC Managed Care – PPO | Admitting: Family Medicine

## 2023-06-25 NOTE — Progress Notes (Deleted)
   Acute Office Visit  Subjective:     Patient ID: Ariel King, female    DOB: 03-22-1971, 52 y.o.   MRN: [id number]  No chief complaint on file.   HPI Patient is in today for fatigue, sleeping,. Boyfriend recently died of overdose.    Sees a psychiatrist and PMR.  Takes two?? Snri?  Med list needs to be fixed    ROS      Objective:    There were no vitals taken for this visit. {Vitals History (Optional):23777}  Physical Exam  No results found for any visits on 06/25/23.      Assessment & Plan:   There are no diagnoses linked to this encounter.   No follow-ups on file.  Sandre Kitty, MD

## 2023-07-04 ENCOUNTER — Other Ambulatory Visit: Payer: BC Managed Care – PPO

## 2023-07-10 ENCOUNTER — Encounter: Payer: Self-pay | Admitting: Family Medicine

## 2023-07-10 ENCOUNTER — Encounter: Payer: BC Managed Care – PPO | Admitting: Family Medicine

## 2023-07-10 NOTE — Progress Notes (Deleted)
Complete physical exam  Patient: Ariel King   DOB: [birth year]   52 y.o. Female  MRN: [id number]  Subjective:    No chief complaint on file.   Ariel King is a 52 y.o. female who presents today for a complete physical exam. She reports consuming a {diet types:17450} diet. {types:19826} She generally feels {DESC; WELL/FAIRLY WELL/POORLY:18703}. She reports sleeping {DESC; WELL/FAIRLY WELL/POORLY:18703}. She {does/does not:200015} have additional problems to discuss today.    Most recent fall risk assessment:    04/04/2023    1:51 PM  Fall Risk   Falls in the past year? 1  Number falls in past yr: 1  Injury with Fall? 0  Risk for fall due to : History of fall(s)  Follow up Falls evaluation completed     Most recent depression and anxiety screenings:    04/04/2023    1:49 PM 01/02/2023   11:48 AM  PHQ 2/9 Scores  PHQ - 2 Score 4 0  PHQ- 9 Score 13       04/04/2023    1:50 PM 03/20/2022    1:32 PM 11/10/2021    4:08 PM 05/04/2020    2:30 PM  GAD 7 : Generalized Anxiety Score  Nervous, Anxious, on Edge 2 2 3 3   Control/stop worrying 2 2 1 1   Worry too much - different things 2 1 1 2   Trouble relaxing 1 1 1 3   Restless 0 1 1 3   Easily annoyed or irritable 1 1 0 1  Afraid - awful might happen 0 0 0 0  Total GAD 7 Score 8 8 7 13   Anxiety Difficulty Somewhat difficult Very difficult Extremely difficult Very difficult    Patient Active Problem List   Diagnosis Date Noted   Emotional crisis as acute reaction to exceptional (gross) stress 10/15/2019   Post-trauma response 10/15/2019   Environmental and seasonal allergies 04/22/2019   Reactive airway disease with wheezing 05/21/2018   Tobacco consumption 05/21/2018   Achilles tendinitis of both lower extremities 05/21/2018   Chronic right-sided low back pain with right-sided sciatica 05/22/2017   Lumbar spine strain, initial encounter 03/22/2017   Severe episode of recurrent major depressive disorder, without  psychotic features (HCC) 03/05/2017   Panic attacks 06/09/2016   Vitamin D deficiency 06/09/2016   Hypothyroidism 12/19/2015   Obesity (BMI 30-39.9) 11/03/2013   Chronic pain syndrome 12/25/2012   h/o Blood glucose elevated 12/25/2012   h/o Hypertriglyceridemia 12/25/2012   Other insomnia 12/25/2012   Chronic fatigue, unspecified 12/25/2012   Anxiety 03/14/2012   Depression 03/14/2012   Chronic fatigue disorder 03/14/2012   Migraine without aura and without status migrainosus, not intractable 03/14/2012    Past Surgical History:  Procedure Laterality Date   ROBOTIC ASSISTED LAP VAGINAL HYSTERECTOMY     SYNOVECTOMY WRIST     Social History   Tobacco Use   Smoking status: Former    Types: Cigarettes   Smokeless tobacco: Never   Tobacco comments:    quit 10 years ago  Vaping Use   Vaping status: Never Used  Substance Use Topics   Alcohol use: No   Drug use: No   Family History  Problem Relation Age of Onset   Diabetes Mother    Migraines Mother    Heart attack Mother    Depression Mother    Hypertension Mother    Alcohol abuse Father    Cancer Father        prostate   Depression Sister  Allergies  Allergen Reactions   Naltrexone Other (See Comments)    Panic attack     Patient Care Team: [name], PA as PCP - General (Family Medicine) Milagros Evener, MD as Consulting Physician (Psychiatry) Elinor Parkinson, DPM as Consulting Physician (Podiatry) [facility] as Consulting Physician (Dermatology) Marikay Alar, DO as Consulting Physician (Rehabilitation) Silverio Lay, MD as Consulting Physician (Obstetrics and Gynecology) Izell Tyrrell, MD as Referring Physician (Endocrinology)   Outpatient Medications Prior to Visit  Medication Sig   albuterol (VENTOLIN HFA) 108 (90 Base) MCG/ACT inhaler INHALE 1 TO 2 PUFFS INTO THE LUNGS EVERY 4 HOURS AS NEEDED FOR WHEEZING OR SHORTNESS OF BREATH   ALPRAZolam (XANAX) 0.5 MG tablet Take 1-2 tablets by  mouth every 6 (six) hours as needed.   ALPRAZolam (XANAX) 1 MG tablet Take by mouth.   amphetamine-dextroamphetamine (ADDERALL) 20 MG tablet Take 1 tablet by mouth 3 (three) times daily.   diclofenac Sodium (VOLTAREN) 1 % GEL Apply 2 g topically 4 (four) times daily.   DULoxetine (CYMBALTA) 20 MG capsule TAKE 1 CAPSULE BY MOUTH TWICE A DAY   Escitalopram Oxalate (LEXAPRO PO)    fluticasone (FLONASE) 50 MCG/ACT nasal spray Place 2 sprays into both nostrils daily.   levocetirizine (XYZAL) 5 MG tablet TAKE 1 TABLET BY MOUTH EVERY EVENING. **PLEASE CONTACT OUR OFFICE TO SCHEDULE A FOLLOW UP FOR FUTURE MED REFILLS**   levothyroxine (SYNTHROID) 112 MCG tablet Take 1 tablet (112 mcg total) by mouth daily.   methocarbamol (ROBAXIN-750) 750 MG tablet Take 1 tablet (750 mg total) by mouth 4 (four) times daily.   montelukast (SINGULAIR) 10 MG tablet Take 1 tablet (10 mg total) by mouth at bedtime.   predniSONE (STERAPRED UNI-PAK 21 TAB) 10 MG (21) TBPK tablet Take as directed   QUEtiapine (SEROQUEL) 100 MG tablet Take 100 mg by mouth as needed.   QUEtiapine Fumarate (SEROQUEL XR) 150 MG 24 hr tablet Take 150 mg by mouth at bedtime.   topiramate (TOPAMAX) 25 MG tablet TAKE 1 TABLET BY MOUTH FIVE TIMES DAILY AS NEEDED FOR PAIN.   traZODone (DESYREL) 100 MG tablet Take 3 tablets by mouth at bedtime.    venlafaxine XR (EFFEXOR-XR) 150 MG 24 hr capsule Take 300 mg by mouth daily.   venlafaxine XR (EFFEXOR-XR) 75 MG 24 hr capsule Take 3 capsules by mouth every morning.   Vitamin D, Ergocalciferol, (DRISDOL) 1.25 MG (50000 UNIT) CAPS capsule TAKE 1 CAPSULE (50,000 UNITS TOTAL) BY MOUTH EVERY 7 (SEVEN) DAYS.   No facility-administered medications prior to visit.    ROS    Objective:    There were no vitals taken for this visit.   Physical Exam    Assessment & Plan:    Routine Health Maintenance and Physical Exam  Immunization History  Administered Date(s) Administered   Tdap 06/15/2014, 01/28/2018    Zoster Recombinant(Shingrix) 05/04/2020    Health Maintenance  Topic Date Due   COVID-19 Vaccine (1) Never done   Colonoscopy  Never done   MAMMOGRAM  Never done   Zoster Vaccines- Shingrix (2 of 2) 06/29/2020   Medicare Annual Wellness (AWV)  03/21/2023   INFLUENZA VACCINE  06/14/2023   DTaP/Tdap/Td (3 - Td or Tdap) 01/29/2028   Hepatitis C Screening  Completed   HIV Screening  Completed   HPV VACCINES  Aged Out   PAP SMEAR-Modifier  Discontinued    Need to repeat labs including CBC, CMP, lipid panel, A1C, TSH, and vitamin D. Last lab  check was in 2022.  Discussed health benefits of physical activity, and encouraged her to engage in regular exercise appropriate for her age and condition.  There are no diagnoses linked to this encounter.  No follow-ups on file.     [name], PA

## 2023-07-30 ENCOUNTER — Other Ambulatory Visit: Payer: Self-pay | Admitting: Physical Medicine and Rehabilitation

## 2023-08-02 ENCOUNTER — Other Ambulatory Visit: Payer: Self-pay | Admitting: Physical Medicine and Rehabilitation

## 2023-08-03 ENCOUNTER — Other Ambulatory Visit: Payer: Self-pay | Admitting: Physical Medicine and Rehabilitation

## 2023-08-03 MED ORDER — QUETIAPINE FUMARATE ER 150 MG PO TB24: 150.0000 mg | ORAL_TABLET | Freq: Every day | ORAL | 3 refills | Status: AC

## 2023-08-03 MED ORDER — METHOCARBAMOL 750 MG PO TABS: 750.0000 mg | ORAL_TABLET | Freq: Four times a day (QID) | ORAL | 3 refills | Status: AC

## 2023-08-10 ENCOUNTER — Other Ambulatory Visit: Payer: Self-pay | Admitting: Physical Medicine and Rehabilitation

## 2023-08-31 ENCOUNTER — Encounter
Payer: BC Managed Care – PPO | Attending: Physical Medicine and Rehabilitation | Admitting: Physical Medicine and Rehabilitation

## 2023-09-03 ENCOUNTER — Other Ambulatory Visit: Payer: Self-pay | Admitting: Family Medicine

## 2023-09-03 DIAGNOSIS — Z1231 Encounter for screening mammogram for malignant neoplasm of breast: Secondary | ICD-10-CM

## 2023-09-03 DIAGNOSIS — Z1211 Encounter for screening for malignant neoplasm of colon: Secondary | ICD-10-CM

## 2023-09-07 DIAGNOSIS — G8929 Other chronic pain: Secondary | ICD-10-CM | POA: Diagnosis not present

## 2023-09-07 DIAGNOSIS — M5441 Lumbago with sciatica, right side: Secondary | ICD-10-CM | POA: Diagnosis not present

## 2023-09-07 DIAGNOSIS — G43709 Chronic migraine without aura, not intractable, without status migrainosus: Secondary | ICD-10-CM | POA: Diagnosis not present

## 2023-09-07 DIAGNOSIS — M797 Fibromyalgia: Secondary | ICD-10-CM | POA: Diagnosis not present

## 2023-09-10 ENCOUNTER — Telehealth: Payer: Self-pay | Admitting: *Deleted

## 2023-09-10 NOTE — Telephone Encounter (Signed)
Discharge letter sent via certified mail and MyChart for continued missed appointments.

## 2023-09-17 ENCOUNTER — Telehealth: Payer: Self-pay

## 2023-09-17 NOTE — Telephone Encounter (Signed)
Patient called in wanting to talk to you about a doctor she saw at [facility].

## 2023-09-18 ENCOUNTER — Other Ambulatory Visit: Payer: Self-pay | Admitting: Family Medicine

## 2023-09-18 DIAGNOSIS — Z1211 Encounter for screening for malignant neoplasm of colon: Secondary | ICD-10-CM

## 2023-09-18 DIAGNOSIS — Z1231 Encounter for screening mammogram for malignant neoplasm of breast: Secondary | ICD-10-CM

## 2023-09-18 NOTE — Progress Notes (Signed)
Ordered breast cancer screening and colon cancer screening procedures.

## 2023-09-26 ENCOUNTER — Other Ambulatory Visit: Payer: Self-pay | Admitting: Family Medicine

## 2023-09-26 DIAGNOSIS — E038 Other specified hypothyroidism: Secondary | ICD-10-CM

## 2023-09-27 ENCOUNTER — Other Ambulatory Visit: Payer: Self-pay | Admitting: Physical Medicine and Rehabilitation

## 2023-09-27 ENCOUNTER — Telehealth: Payer: Self-pay

## 2023-09-27 DIAGNOSIS — M5431 Sciatica, right side: Secondary | ICD-10-CM

## 2023-09-27 NOTE — Telephone Encounter (Signed)
Patient is requested a referral to a pain clinic in [place]. Or somewhere between [facility].

## 2023-09-28 NOTE — Telephone Encounter (Signed)
Message left on patient voicemail: [facility].

## 2023-10-02 ENCOUNTER — Telehealth: Payer: Self-pay | Admitting: Physical Medicine and Rehabilitation

## 2023-10-02 NOTE — Telephone Encounter (Signed)
  Ariel King has requested a new pain referral to:   [facility]   Ph [phone], Fax [phone] [address] [place] [address]  When placed please fax referral to above location.

## 2023-10-02 NOTE — Telephone Encounter (Signed)
Patient called in requesting assistance on a referral for pain management . Patient was referred to [facility] but they do not offer pain management and patient is asking for alternatives

## 2023-10-03 ENCOUNTER — Other Ambulatory Visit: Payer: Self-pay | Admitting: Physical Medicine and Rehabilitation

## 2023-10-03 ENCOUNTER — Encounter: Payer: Self-pay | Admitting: *Deleted

## 2023-10-03 DIAGNOSIS — M5431 Sciatica, right side: Secondary | ICD-10-CM

## 2023-10-03 NOTE — Telephone Encounter (Addendum)
Patient has been dismissed from our clinic. She will need to reach out to her pcp.

## 2023-10-04 ENCOUNTER — Ambulatory Visit: Payer: BC Managed Care – PPO | Attending: Family Medicine

## 2023-10-04 ENCOUNTER — Encounter: Payer: Self-pay | Admitting: Family Medicine

## 2023-10-04 ENCOUNTER — Ambulatory Visit (INDEPENDENT_AMBULATORY_CARE_PROVIDER_SITE_OTHER): Payer: 59 | Admitting: Family Medicine

## 2023-10-04 VITALS — BP 94/61 | HR 87 | Resp 18 | Ht 64.0 in | Wt 166.0 lb

## 2023-10-04 DIAGNOSIS — R739 Hyperglycemia, unspecified: Secondary | ICD-10-CM

## 2023-10-04 DIAGNOSIS — E559 Vitamin D deficiency, unspecified: Secondary | ICD-10-CM

## 2023-10-04 DIAGNOSIS — E669 Obesity, unspecified: Secondary | ICD-10-CM

## 2023-10-04 DIAGNOSIS — E781 Pure hyperglyceridemia: Secondary | ICD-10-CM | POA: Diagnosis not present

## 2023-10-04 DIAGNOSIS — E038 Other specified hypothyroidism: Secondary | ICD-10-CM

## 2023-10-04 DIAGNOSIS — R001 Bradycardia, unspecified: Secondary | ICD-10-CM | POA: Diagnosis not present

## 2023-10-04 NOTE — Progress Notes (Signed)
Acute Office Visit  Subjective:     Patient ID: Ariel King, female    DOB: 1971/03/27, 52 y.o.   MRN: [id number]  Chief Complaint  Patient presents with   resipiration    HPI Patient is in today due to concerns that her app told her that her heart rate and respiration rate were too slow.  She has an app on her phone that measures her heart rate by placing her finger over the camera, and measures her respiration rate by pointing the camera at herself as it measures her face and chest.  She does not remember specific numbers but states that they were flagged as being low.  She states that at her psychiatry appointment, her psychiatrist took her vital signs and also recommended to follow-up with primary care.  She is working with Dr. Evelene Croon for management of psychiatric medication and was previously working with [place] pain management but has switched to [facility] recently.  Confirmed current medication list with her: Taking venlafaxine, gabapentin, Cymbalta, topiramate, trazodone.  Also has prescriptions for quetiapine, methocarbamol, tramadol.  Endorses episodes where it is difficult to breathe in or out, states that it feels different than her typical episodes of wheezing.  Albuterol inhaler is ineffective.  She has never had a similar issue before.  Denies headache, dizziness, chest pain, palpitations, cough, GI upset, fever/chills.  Unrelated, she also complains of slight itching of both ears for about 2 days.  ROS See HPI    Objective:    BP 94/61 (BP Location: Left Arm, Patient Position: Sitting, Cuff Size: Normal)   Pulse 87   Resp 18   Ht 5\' 4"  (1.626 m)   Wt 166 lb (75.3 kg)   SpO2 97%   BMI 28.49 kg/m   Physical Exam Constitutional:      General: She is not in acute distress.    Appearance: Normal appearance.  HENT:     Head: Normocephalic and atraumatic.     Right Ear: Tympanic membrane, ear canal and external ear normal. There is no impacted  cerumen.     Left Ear: Tympanic membrane, ear canal and external ear normal. There is no impacted cerumen.  Cardiovascular:     Rate and Rhythm: Normal rate and regular rhythm.     Heart sounds: No murmur heard.    No friction rub. No gallop.  Pulmonary:     Effort: Pulmonary effort is normal. No respiratory distress.     Breath sounds: No wheezing, rhonchi or rales.  Skin:    General: Skin is warm and dry.  Neurological:     Mental Status: She is alert and oriented to person, place, and time.  Psychiatric:        Speech: Speech is slurred.      Assessment & Plan:  Other specified hypothyroidism Assessment & Plan: Rechecking thyroid labs today, have not been checked since February 2023.  Continue levothyroxine 112 mcg daily unless lab results warrant change.  Orders: -     TSH; Future -     T4, free; Future -     Vitamin B12; Future  Vitamin D deficiency -     VITAMIN D 25 Hydroxy (Vit-D Deficiency, Fractures); Future  h/o Hypertriglyceridemia -     Lipid panel; Future  h/o Blood glucose elevated -     Hemoglobin A1c; Future  Obesity (BMI 30-39.9) -     TSH; Future -     T4, free; Future -  CBC with Differential/Platelet; Future -     Comprehensive metabolic panel; Future -     Hemoglobin A1c; Future -     Lipid panel; Future -     VITAMIN D 25 Hydroxy (Vit-D Deficiency, Fractures); Future -     Vitamin B12; Future  Bradycardia -     LONG TERM MONITOR (3-14 DAYS); Future  Given concerns for bradycardia, ordered ZIO monitor for 7 days. In office today, heart rate 87 and respiration rate 18.  Provided education on normal ranges for vital signs and accurate ways to measure those vital signs.  Also checking blood work to ensure that thyroid, electrolyte abnormalities, blood sugar, vitamin deficiencies are not contributing to symptoms.  Given her long list of medications, contacting pain management and psychiatry to ensure coordination of care and avoid medication  interactions that may exacerbate CNS/respiratory depression.  Return as schedule 10/31/2023.  I spent 45 minutes on the day of the encounter to include pre-visit record review, face-to-face time with the patient collecting history and performing physical exam, and post visit ordering of tests and coordination of care with specialists.  [name], PA

## 2023-10-04 NOTE — Patient Instructions (Addendum)
We will check your routine labs as well as vitamins and thyroid to ensure that there are no other potential contributing factors to possible low heart rate or low breathing rate.  I have ordered a heart monitor for you to wear for 1 week.  We will see what your heart rate shows on the monitor over the course of that time.  I am also going to get in touch with your psychiatrist and pain management to make sure that we all have updated lists of your medications to check that there are no interactions that may contribute to low heart rate or low breathing rate.  NORMAL HEART RATE: Between 60 and 100 bpm You can check your heart rate at home by feeling for your pulse with your pointer and middle finger on the opposite wrist and counting how many beats in 1 minute.  NORMAL BREATHING RATE: Between 12 and 24 breaths/min

## 2023-10-04 NOTE — Progress Notes (Unsigned)
EP to read

## 2023-10-04 NOTE — Assessment & Plan Note (Signed)
Rechecking thyroid labs today, have not been checked since February 2023.  Continue levothyroxine 112 mcg daily unless lab results warrant change.

## 2023-10-05 LAB — COMPREHENSIVE METABOLIC PANEL
ALT: 10 [IU]/L (ref 0–32)
AST: 13 [IU]/L (ref 0–40)
Albumin: 4.3 g/dL (ref 3.8–4.9)
Alkaline Phosphatase: 89 [IU]/L (ref 44–121)
BUN/Creatinine Ratio: 13 (ref 9–23)
BUN: 11 mg/dL (ref 6–24)
Bilirubin Total: 0.2 mg/dL (ref 0.0–1.2)
CO2: 25 mmol/L (ref 20–29)
Calcium: 9.5 mg/dL (ref 8.7–10.2)
Chloride: 104 mmol/L (ref 96–106)
Creatinine, Ser: 0.82 mg/dL (ref 0.57–1.00)
Globulin, Total: 2.2 g/dL (ref 1.5–4.5)
Glucose: 85 mg/dL (ref 70–99)
Potassium: 3.8 mmol/L (ref 3.5–5.2)
Sodium: 141 mmol/L (ref 134–144)
Total Protein: 6.5 g/dL (ref 6.0–8.5)
eGFR: 85 mL/min/{1.73_m2} (ref >59)

## 2023-10-05 LAB — TSH: TSH: 0.324 u[IU]/mL — ABNORMAL LOW (ref 0.450–4.500)

## 2023-10-05 LAB — CBC WITH DIFFERENTIAL/PLATELET
Basophils Absolute: 0 10*3/uL (ref 0.0–0.2)
Basos: 1 %
EOS (ABSOLUTE): 0.4 10*3/uL (ref 0.0–0.4)
Eos: 5 %
Hematocrit: 43.5 % (ref 34.0–46.6)
Hemoglobin: 14.2 g/dL (ref 11.1–15.9)
Immature Grans (Abs): 0 10*3/uL (ref 0.0–0.1)
Immature Granulocytes: 0 %
Lymphocytes Absolute: 2.9 10*3/uL (ref 0.7–3.1)
Lymphs: 36 %
MCH: 30.3 pg (ref 26.6–33.0)
MCHC: 32.6 g/dL (ref 31.5–35.7)
MCV: 93 fL (ref 79–97)
Monocytes Absolute: 0.9 10*3/uL (ref 0.1–0.9)
Monocytes: 11 %
Neutrophils Absolute: 3.8 10*3/uL (ref 1.4–7.0)
Neutrophils: 47 %
Platelets: 238 10*3/uL (ref 150–450)
RBC: 4.68 x10E6/uL (ref 3.77–5.28)
RDW: 12.8 % (ref 11.7–15.4)
WBC: 8 10*3/uL (ref 3.4–10.8)

## 2023-10-05 LAB — T4, FREE: Free T4: 1.34 ng/dL (ref 0.82–1.77)

## 2023-10-05 LAB — LIPID PANEL
Chol/HDL Ratio: 3.7 ratio (ref 0.0–4.4)
Cholesterol, Total: 186 mg/dL (ref 100–199)
HDL: 50 mg/dL (ref >39)
LDL Chol Calc (NIH): 99 mg/dL (ref 0–99)
Triglycerides: 219 mg/dL — ABNORMAL HIGH (ref 0–149)
VLDL Cholesterol Cal: 37 mg/dL (ref 5–40)

## 2023-10-05 LAB — VITAMIN B12: Vitamin B-12: 791 pg/mL (ref 232–1245)

## 2023-10-05 LAB — HEMOGLOBIN A1C
Est. average glucose Bld gHb Est-mCnc: 103 mg/dL
Hgb A1c MFr Bld: 5.2 % (ref 4.8–5.6)

## 2023-10-05 LAB — VITAMIN D 25 HYDROXY (VIT D DEFICIENCY, FRACTURES): Vit D, 25-Hydroxy: 38.8 ng/mL (ref 30.0–100.0)

## 2023-10-07 DIAGNOSIS — R001 Bradycardia, unspecified: Secondary | ICD-10-CM | POA: Diagnosis not present

## 2023-10-15 ENCOUNTER — Ambulatory Visit (INDEPENDENT_AMBULATORY_CARE_PROVIDER_SITE_OTHER): Payer: 59

## 2023-10-15 DIAGNOSIS — Z0000 Encounter for general adult medical examination without abnormal findings: Secondary | ICD-10-CM

## 2023-10-15 NOTE — Telephone Encounter (Signed)
Copied from CRM [id number]. Topic: Clinical - Medical Advice >> Oct 15, 2023  2:50 PM Almira Coaster wrote: Reason for CRM: Patient would like to follow up with Barb Merino, LPN regarding some cleaning services she is eligable for.

## 2023-10-15 NOTE — Patient Instructions (Signed)
Ms. Steigerwald , Thank you for taking time to come for your Medicare Wellness Visit. I appreciate your ongoing commitment to your health goals. Please review the following plan we discussed and let me know if I can assist you in the future.   Referrals/Orders/Follow-Ups/Clinician Recommendations: none  This is a list of the screening recommended for you and due dates:  Health Maintenance  Topic Date Due   Cologuard (Stool DNA test)  Never done   Mammogram  Never done   COVID-19 Vaccine (1 - 2023-24 season) 10/20/2023*   Zoster (Shingles) Vaccine (2 of 2) 01/04/2024*   Flu Shot  02/11/2024*   Medicare Annual Wellness Visit  10/14/2024   DTaP/Tdap/Td vaccine (3 - Td or Tdap) 01/29/2028   Hepatitis C Screening  Completed   HIV Screening  Completed   HPV Vaccine  Aged Out  *Topic was postponed. The date shown is not the original due date.    Advanced directives: (ACP Link)Information on Advanced Care Planning can be found at [facility] Advance Health Care Directives Advance Health Care Directives ([email or web address]   Next Medicare Annual Wellness Visit scheduled for next year: No, schedule not open for next year  insert Preventive Care Attachment Reference

## 2023-10-15 NOTE — Progress Notes (Signed)
Subjective:   Ariel King is a 52 y.o. female who presents for Medicare Annual (Subsequent) preventive examination.  Visit Complete: Virtual I connected with  Ariel King on 10/15/23 by a audio enabled telemedicine application and verified that I am speaking with the correct person using two identifiers.  Patient Location: Home  Provider Location: Office/Clinic  I discussed the limitations of evaluation and management by telemedicine. The patient expressed understanding and agreed to proceed.  Vital Signs: Because this visit was a virtual/telehealth visit, some criteria may be missing or patient reported. Any vitals not documented were not able to be obtained and vitals that have been documented are patient reported.    Cardiac Risk Factors include: none     Objective:    Today's Vitals   10/15/23 1356  PainSc: 7    There is no height or weight on file to calculate BMI.     10/15/2023    2:13 PM 06/20/2017   11:30 AM 05/25/2016    2:33 PM  Advanced Directives  Does Patient Have a Medical Advance Directive? No No No  Would patient like information on creating a medical advance directive?  Yes (MAU/Ambulatory/Procedural Areas - Information given) No - patient declined information;Yes - Educational materials given    Current Medications (verified) Outpatient Encounter Medications as of 10/15/2023  Medication Sig   albuterol (VENTOLIN HFA) 108 (90 Base) MCG/ACT inhaler INHALE 1 TO 2 PUFFS INTO THE LUNGS EVERY 4 HOURS AS NEEDED FOR WHEEZING OR SHORTNESS OF BREATH   ALPRAZolam (XANAX) 1 MG tablet Take by mouth.   amphetamine-dextroamphetamine (ADDERALL) 20 MG tablet Take 1 tablet by mouth 3 (three) times daily.   diclofenac Sodium (VOLTAREN) 1 % GEL Apply 2 g topically 4 (four) times daily.   DULoxetine (CYMBALTA) 20 MG capsule TAKE 1 CAPSULE BY MOUTH TWICE A DAY   fluticasone (FLONASE) 50 MCG/ACT nasal spray Place 2 sprays into both nostrils daily.   gabapentin  (NEURONTIN) 600 MG tablet Take 600 mg by mouth 4 (four) times daily.   levocetirizine (XYZAL) 5 MG tablet TAKE 1 TABLET BY MOUTH EVERY EVENING. **PLEASE CONTACT OUR OFFICE TO SCHEDULE A FOLLOW UP FOR FUTURE MED REFILLS**   levothyroxine (SYNTHROID) 112 MCG tablet TAKE 1 TABLET (112 MCG TOTAL) BY MOUTH DAILY.   methocarbamol (ROBAXIN-750) 750 MG tablet Take 1 tablet (750 mg total) by mouth 4 (four) times daily.   montelukast (SINGULAIR) 10 MG tablet Take 1 tablet (10 mg total) by mouth at bedtime.   QUEtiapine Fumarate (SEROQUEL XR) 150 MG 24 hr tablet Take 1 tablet (150 mg total) by mouth at bedtime.   topiramate (TOPAMAX) 25 MG tablet TAKE 1 TABLET BY MOUTH FIVE TIMES DAILY AS NEEDED FOR PAIN.   traZODone (DESYREL) 100 MG tablet Take 3 tablets by mouth at bedtime.    venlafaxine XR (EFFEXOR-XR) 150 MG 24 hr capsule Take 300 mg by mouth every morning.   Vitamin D, Ergocalciferol, (DRISDOL) 1.25 MG (50000 UNIT) CAPS capsule TAKE 1 CAPSULE (50,000 UNITS TOTAL) BY MOUTH EVERY 7 (SEVEN) DAYS.   No facility-administered encounter medications on file as of 10/15/2023.    Allergies (verified) Naltrexone   History: Past Medical History:  Diagnosis Date   Abnormal Pap smear    Adult hypothyroidism 12/19/2015   Anemia    Anxiety    Bladder disorder    Chronic fatigue and immune dysfunction syndrome (HCC)    Chronic kidney disease    Depression    Depression  EBV infection    H/O bladder infections    H/O joint problems    Hx of migraines    Incontinence    Thyroid disease    Past Surgical History:  Procedure Laterality Date   SYNOVECTOMY WRIST     TOTAL VAGINAL HYSTERECTOMY     Family History  Problem Relation Age of Onset   Diabetes Mother    Migraines Mother    Heart attack Mother    Depression Mother    Hypertension Mother    Alcohol abuse Father    Cancer Father        prostate   Depression Sister    Social History   Socioeconomic History   Marital status: Single     Spouse name: Not on file   Number of children: Not on file   Years of education: Not on file   Highest education level: Not on file  Occupational History   Not on file  Tobacco Use   Smoking status: Former    Types: Cigarettes    Passive exposure: Past   Smokeless tobacco: Never   Tobacco comments:    quit 10 years ago  Vaping Use   Vaping status: Never Used  Substance and Sexual Activity   Alcohol use: No   Drug use: No   Sexual activity: Yes    Partners: Male    Birth control/protection: Other-see comments    Comment: hyst  Other Topics Concern   Not on file  Social History Narrative   Not on file   Social Determinants of Health   Financial Resource Strain: Low Risk  (10/15/2023)   Overall Financial Resource Strain (CARDIA)    Difficulty of Paying Living Expenses: Not hard at all  Food Insecurity: No Food Insecurity (10/15/2023)   Hunger Vital Sign    Worried About Running Out of Food in the Last Year: Never true    Ran Out of Food in the Last Year: Never true  Transportation Needs: No Transportation Needs (10/15/2023)   PRAPARE - Administrator, Civil Service (Medical): No    Lack of Transportation (Non-Medical): No  Physical Activity: Inactive (10/15/2023)   Exercise Vital Sign    Days of Exercise per Week: 0 days    Minutes of Exercise per Session: 0 min  Stress: Stress Concern Present (10/15/2023)   [employer] of Occupational Health - Occupational Stress Questionnaire    Feeling of Stress : To some extent  Social Connections: Unknown (10/15/2023)   Social Connection and Isolation Panel [NHANES]    Frequency of Communication with Friends and Family: More than three times a week    Frequency of Social Gatherings with Friends and Family: More than three times a week    Attends Religious Services: Never    Database administrator or Organizations: No    Attends Engineer, structural: Never    Marital Status: Not on file    Tobacco  Counseling Counseling given: Not Answered Tobacco comments: quit 10 years ago   Clinical Intake:  Pre-visit preparation completed: Yes  Pain : 0-10 Pain Score: 7  Pain Type: Chronic pain Pain Location: Hip Pain Orientation: Right Pain Radiating Towards: down to right foot Pain Descriptors / Indicators: Aching Pain Onset: More than a month ago Pain Frequency: Constant     Nutritional Risks: None Diabetes: No  How often do you need to have someone help you when you read instructions, pamphlets, or other written materials from your doctor  or pharmacy?: 1 - Never  Interpreter Needed?: No  Information entered by :: NAllen LPN   Activities of Daily Living    10/15/2023    2:00 PM  In your present state of health, do you have any difficulty performing the following activities:  Hearing? 0  Vision? 0  Difficulty concentrating or making decisions? 1  Walking or climbing stairs? 1  Comment due to hip  Dressing or bathing? 0  Doing errands, shopping? 0  Preparing Food and eating ? N  Using the Toilet? N  In the past six months, have you accidently leaked urine? Y  Do you have problems with loss of bowel control? N  Managing your Medications? N  Managing your Finances? N  Housekeeping or managing your Housekeeping? N    Patient Care Team: [name], PA as PCP - General (Family Medicine) Milagros Evener, MD as Consulting Physician (Psychiatry) Elinor Parkinson, DPM as Consulting Physician (Podiatry) [facility] as Consulting Physician (Dermatology) Marikay Alar, DO as Consulting Physician (Rehabilitation) Silverio Lay, MD as Consulting Physician (Obstetrics and Gynecology) Izell , MD as Referring Physician (Endocrinology)  Indicate any recent Medical Services you may have received from other than [facility] providers in the past year (date may be approximate).     Assessment:   This is a routine wellness examination for Ariel King.  Hearing/Vision  screen Hearing Screening - Comments:: Denies hearing issues Vision Screening - Comments:: No regular eye exams   Goals Addressed             This Visit's Progress    Patient Stated       10/15/2023, wants to lose more weight and get back to the gym and eat more healthy       Depression Screen    10/15/2023    2:15 PM 10/04/2023    8:40 AM 04/04/2023    1:49 PM 01/02/2023   11:48 AM 12/12/2022    9:45 AM 12/05/2022    1:51 PM 11/10/2022   10:19 AM  PHQ 2/9 Scores  PHQ - 2 Score 2 3 4  0 2 0 0  PHQ- 9 Score 5 9 13         Fall Risk    10/15/2023    2:13 PM 04/04/2023    1:51 PM 01/02/2023   11:48 AM 12/12/2022    9:45 AM 12/05/2022    1:51 PM  Fall Risk   Falls in the past year? 1 1 0 0 0  Comment loses balance      Number falls in past yr: 1 1 0 0 0  Injury with Fall? 0 0 0 0 0  Risk for fall due to : History of fall(s);Medication side effect;Impaired balance/gait History of fall(s)     Follow up Falls prevention discussed;Falls evaluation completed Falls evaluation completed       MEDICARE RISK AT HOME: Medicare Risk at Home Any stairs in or around the home?: Yes If so, are there any without handrails?: No Home free of loose throw rugs in walkways, pet beds, electrical cords, etc?: No Adequate lighting in your home to reduce risk of falls?: Yes Life alert?: No Use of a cane, walker or w/c?: No Grab bars in the bathroom?: Yes Shower chair or bench in shower?: Yes Elevated toilet seat or a handicapped toilet?: No  TIMED UP AND GO:  Was the test performed?  No    Cognitive Function:        10/15/2023  2:17 PM 03/20/2022    1:33 PM 12/15/2020    1:22 PM  6CIT Screen  What Year? 0 points 0 points 0 points  What month? 0 points 0 points 0 points  What time? 0 points 0 points 0 points  Count back from 20 0 points 0 points 0 points  Months in reverse 0 points 0 points 0 points  Repeat phrase 2 points 0 points 0 points  Total Score 2 points 0 points 0 points     Immunizations Immunization History  Administered Date(s) Administered   Tdap 06/15/2014, 01/28/2018   Zoster Recombinant(Shingrix) 05/04/2020    TDAP status: Up to date  Flu Vaccine status: Declined, Education has been provided regarding the importance of this vaccine but patient still declined. Advised may receive this vaccine at local pharmacy or Health Dept. Aware to provide a copy of the vaccination record if obtained from local pharmacy or Health Dept. Verbalized acceptance and understanding.  Pneumococcal vaccine status: Up to date  Covid-19 vaccine status: Declined, Education has been provided regarding the importance of this vaccine but patient still declined. Advised may receive this vaccine at local pharmacy or Health Dept.or vaccine clinic. Aware to provide a copy of the vaccination record if obtained from local pharmacy or Health Dept. Verbalized acceptance and understanding.  Qualifies for Shingles Vaccine? Yes   Zostavax completed No   Shingrix Completed?: Yes  Screening Tests Health Maintenance  Topic Date Due   Fecal DNA (Cologuard)  Never done   MAMMOGRAM  Never done   COVID-19 Vaccine (1 - 2023-24 season) 10/20/2023 (Originally 07/15/2023)   Zoster Vaccines- Shingrix (2 of 2) 01/04/2024 (Originally 06/29/2020)   INFLUENZA VACCINE  02/11/2024 (Originally 06/14/2023)   Medicare Annual Wellness (AWV)  10/14/2024   DTaP/Tdap/Td (3 - Td or Tdap) 01/29/2028   Hepatitis C Screening  Completed   HIV Screening  Completed   HPV VACCINES  Aged Out    Health Maintenance  Health Maintenance Due  Topic Date Due   Fecal DNA (Cologuard)  Never done   MAMMOGRAM  Never done    Colorectal cancer screening: has cologuard kit   Mammogram status: scheduled for 11/09/2023  Bone Density status: n/a  Lung Cancer Screening: (Low Dose CT Chest recommended if Age 58-80 years, 20 pack-year currently smoking OR have quit w/in 15years.) does not qualify.   Lung Cancer Screening  Referral: no  Additional Screening:  Hepatitis C Screening: does qualify; Completed 10/25/2016  Vision Screening: Recommended annual ophthalmology exams for early detection of glaucoma and other disorders of the eye. Is the patient up to date with their annual eye exam?  No  Who is the provider or what is the name of the office in which the patient attends annual eye exams? none If pt is not established with a provider, would they like to be referred to a provider to establish care? No .   Dental Screening: Recommended annual dental exams for proper oral hygiene  Diabetic Foot Exam: n/a  Community Resource Referral / Chronic Care Management: CRR required this visit?  Yes   CCM required this visit?  No     Plan:     I have personally reviewed and noted the following in the patient's chart:   Medical and social history Use of alcohol, tobacco or illicit drugs  Current medications and supplements including opioid prescriptions. Patient is not currently taking opioid prescriptions. Functional ability and status Nutritional status Physical activity Advanced directives List of other physicians Hospitalizations, surgeries,  and ER visits in previous 12 months Vitals Screenings to include cognitive, depression, and falls Referrals and appointments  In addition, I have reviewed and discussed with patient certain preventive protocols, quality metrics, and best practice recommendations. A written personalized care plan for preventive services as well as general preventive health recommendations were provided to patient.     Barb Merino, LPN   [id number]/04/[id number]   After Visit Summary: (Pick Up) Due to this being a telephonic visit, with patients personalized plan was offered to patient and patient has requested to Pick up at office.  Nurse Notes: none

## 2023-10-16 ENCOUNTER — Telehealth: Payer: Self-pay | Admitting: *Deleted

## 2023-10-16 NOTE — Progress Notes (Signed)
  Care Coordination  Outreach Note  10/16/2023 Name: Ariel King MRN: [id number] DOB: 09/15/1971   Care Coordination Outreach Attempts: An unsuccessful telephone outreach was attempted today to offer the patient information about available care coordination services.  Follow Up Plan:  Additional outreach attempts will be made to offer the patient care coordination information and services.   Encounter Outcome:  No Answer  Gwenevere Ghazi  Care Coordination Care Guide  Direct Dial: [phone]

## 2023-10-16 NOTE — Telephone Encounter (Signed)
Barb Merino, LPN  [id number] hours ago (4:27 PM)    I sent in a referral to the [facility]. They should contact her and let her know if they have any resources that does this.   You  Barb Merino, [place] hours ago (4:20 PM)    Patient wanted to speak to you about cleaning services that you mentioned she may be eligible for.  I did not see what this might be in the note from her AWV today.  Please let me know if you need me to place any referrals, thanks!

## 2023-10-18 NOTE — Progress Notes (Signed)
  Care Coordination  Outreach Note  10/18/2023 Name: EATHER POEPPING MRN: [id number] DOB: [birth year]   Care Coordination Outreach Attempts: A second unsuccessful outreach was attempted today to offer the patient with information about available care coordination services.  Follow Up Plan:  Additional outreach attempts will be made to offer the patient care coordination information and services.   Encounter Outcome:  No Answer  Gwenevere Ghazi  Care Coordination Care Guide  Direct Dial: [phone]

## 2023-10-19 NOTE — Progress Notes (Signed)
  Care Coordination Note  10/19/2023 Name: CHALINA DENIGRIS MRN: [id number] DOB: 03/02/1971  Lowella Grip Cremer is a 52 y.o. year old female who is a primary care patient of [name], PA and is actively engaged with the care management team. I reached out to [employer] by phone today to assist with re-scheduling an initial visit with the BSW  Follow up plan: Telephone appointment with care management team member scheduled for:12/23  [facility] Coordination Care Guide  Direct Dial: [phone]

## 2023-10-23 DIAGNOSIS — R001 Bradycardia, unspecified: Secondary | ICD-10-CM | POA: Diagnosis not present

## 2023-10-24 ENCOUNTER — Other Ambulatory Visit: Payer: BC Managed Care – PPO

## 2023-10-27 LAB — COLOGUARD

## 2023-10-31 ENCOUNTER — Encounter: Payer: BC Managed Care – PPO | Admitting: Family Medicine

## 2023-11-01 ENCOUNTER — Ambulatory Visit: Payer: 59 | Admitting: Family Medicine

## 2023-11-01 ENCOUNTER — Encounter: Payer: Self-pay | Admitting: Family Medicine

## 2023-11-01 VITALS — BP 98/64 | HR 93 | Ht 64.0 in | Wt 170.1 lb

## 2023-11-01 DIAGNOSIS — D72829 Elevated white blood cell count, unspecified: Secondary | ICD-10-CM | POA: Diagnosis not present

## 2023-11-01 DIAGNOSIS — E038 Other specified hypothyroidism: Secondary | ICD-10-CM | POA: Diagnosis not present

## 2023-11-01 DIAGNOSIS — J3089 Other allergic rhinitis: Secondary | ICD-10-CM | POA: Diagnosis not present

## 2023-11-01 DIAGNOSIS — Z0000 Encounter for general adult medical examination without abnormal findings: Secondary | ICD-10-CM | POA: Diagnosis not present

## 2023-11-01 DIAGNOSIS — J4520 Mild intermittent asthma, uncomplicated: Secondary | ICD-10-CM | POA: Diagnosis not present

## 2023-11-01 LAB — POCT URINALYSIS DIP (CLINITEK)
Bilirubin, UA: NEGATIVE
Blood, UA: NEGATIVE
Glucose, UA: NEGATIVE mg/dL
Ketones, POC UA: NEGATIVE mg/dL
Nitrite, UA: NEGATIVE
POC PROTEIN,UA: NEGATIVE
Spec Grav, UA: 1.02 (ref 1.010–1.025)
Urobilinogen, UA: 1 U/dL
pH, UA: 7.5 (ref 5.0–8.0)

## 2023-11-01 MED ORDER — LEVOCETIRIZINE DIHYDROCHLORIDE 5 MG PO TABS: ORAL_TABLET | ORAL | 1 refills | Status: DC

## 2023-11-01 NOTE — Progress Notes (Signed)
Complete physical exam  Patient: Ariel King   DOB: 03-14-1971   52 y.o. Female  MRN: [id number]  Subjective:    Chief Complaint  Patient presents with   Annual Exam    Ariel King is a 52 y.o. female who presents today for a complete physical exam. She reports consuming a general diet.  She generally feels fairly well. She reports sleeping fairly well. She does have concerns about a change in her urinary odor.  She does note that she does not drink enough fluids throughout the day.  Denies fever, chills, dysuria, flank pain.    Most recent fall risk assessment:    10/15/2023    2:13 PM  Fall Risk   Falls in the past year? 1  Comment loses balance  Number falls in past yr: 1  Injury with Fall? 0  Risk for fall due to : History of fall(s);Medication side effect;Impaired balance/gait  Follow up Falls prevention discussed;Falls evaluation completed     Most recent depression and anxiety screenings:    11/01/2023    2:16 PM 10/15/2023    2:15 PM  PHQ 2/9 Scores  PHQ - 2 Score 2 2  PHQ- 9 Score 10 5      11/01/2023    2:17 PM 10/04/2023    8:41 AM 04/04/2023    1:50 PM 03/20/2022    1:32 PM  GAD 7 : Generalized Anxiety Score  Nervous, Anxious, on Edge 1 1 2 2   Control/stop worrying 2 1 2 2   Worry too much - different things 1 2 2 1   Trouble relaxing 1 2 1 1   Restless 1 2 0 1  Easily annoyed or irritable 1 2 1 1   Afraid - awful might happen 1 0 0 0  Total GAD 7 Score 8 10 8 8   Anxiety Difficulty  Not difficult at all Somewhat difficult Very difficult    Patient Active Problem List   Diagnosis Date Noted   Emotional crisis as acute reaction to exceptional (gross) stress 10/15/2019   Post-trauma response 10/15/2019   Environmental and seasonal allergies 04/22/2019   Reactive airway disease with wheezing 05/21/2018   Tobacco consumption 05/21/2018   Achilles tendinitis of both lower extremities 05/21/2018   Chronic right-sided low back pain with right-sided  sciatica 05/22/2017   Lumbar spine strain, initial encounter 03/22/2017   Panic attacks 06/09/2016   Vitamin D deficiency 06/09/2016   Hypothyroidism 12/19/2015   Obesity (BMI 30-39.9) 11/03/2013   Chronic pain syndrome 12/25/2012   h/o Blood glucose elevated 12/25/2012   h/o Hypertriglyceridemia 12/25/2012   Other insomnia 12/25/2012   Chronic fatigue, unspecified 12/25/2012   Anxiety 03/14/2012   Chronic fatigue disorder 03/14/2012   Migraine without aura and without status migrainosus, not intractable 03/14/2012   Severe episode of recurrent major depressive disorder, without psychotic features (HCC) 03/14/2012    Past Surgical History:  Procedure Laterality Date   SYNOVECTOMY WRIST     TOTAL VAGINAL HYSTERECTOMY     Social History   Tobacco Use   Smoking status: Former    Types: Cigarettes    Passive exposure: Past   Smokeless tobacco: Never   Tobacco comments:    quit 10 years ago  Vaping Use   Vaping status: Never Used  Substance Use Topics   Alcohol use: No   Drug use: No   Family History  Problem Relation Age of Onset   Diabetes Mother    Migraines Mother    Heart  attack Mother    Depression Mother    Hypertension Mother    Alcohol abuse Father    Cancer Father        prostate   Depression Sister    Allergies  Allergen Reactions   Naltrexone Other (See Comments)    Panic attack     Patient Care Team: [name], PA as PCP - General (Family Medicine) Milagros Evener, MD as Consulting Physician (Psychiatry) Elinor Parkinson, DPM as Consulting Physician (Podiatry) [facility] as Consulting Physician (Dermatology) Marikay Alar, DO as Consulting Physician (Rehabilitation) Silverio Lay, MD as Consulting Physician (Obstetrics and Gynecology) Izell Padre Ranchitos, MD as Referring Physician (Endocrinology)   Outpatient Medications Prior to Visit  Medication Sig   albuterol (VENTOLIN HFA) 108 (90 Base) MCG/ACT inhaler INHALE 1 TO 2 PUFFS INTO  THE LUNGS EVERY 4 HOURS AS NEEDED FOR WHEEZING OR SHORTNESS OF BREATH   ALPRAZolam (XANAX) 1 MG tablet Take by mouth.   amphetamine-dextroamphetamine (ADDERALL) 20 MG tablet Take 1 tablet by mouth 3 (three) times daily.   diclofenac Sodium (VOLTAREN) 1 % GEL Apply 2 g topically 4 (four) times daily.   DULoxetine (CYMBALTA) 20 MG capsule TAKE 1 CAPSULE BY MOUTH TWICE A DAY   fluticasone (FLONASE) 50 MCG/ACT nasal spray Place 2 sprays into both nostrils daily.   gabapentin (NEURONTIN) 600 MG tablet Take 600 mg by mouth 4 (four) times daily.   levothyroxine (SYNTHROID) 112 MCG tablet TAKE 1 TABLET (112 MCG TOTAL) BY MOUTH DAILY.   methocarbamol (ROBAXIN-750) 750 MG tablet Take 1 tablet (750 mg total) by mouth 4 (four) times daily.   montelukast (SINGULAIR) 10 MG tablet Take 1 tablet (10 mg total) by mouth at bedtime.   QUEtiapine Fumarate (SEROQUEL XR) 150 MG 24 hr tablet Take 1 tablet (150 mg total) by mouth at bedtime.   topiramate (TOPAMAX) 25 MG tablet TAKE 1 TABLET BY MOUTH FIVE TIMES DAILY AS NEEDED FOR PAIN.   traZODone (DESYREL) 100 MG tablet Take 3 tablets by mouth at bedtime.    venlafaxine XR (EFFEXOR-XR) 150 MG 24 hr capsule Take 300 mg by mouth every morning.   Vitamin D, Ergocalciferol, (DRISDOL) 1.25 MG (50000 UNIT) CAPS capsule TAKE 1 CAPSULE (50,000 UNITS TOTAL) BY MOUTH EVERY 7 (SEVEN) DAYS.   [DISCONTINUED] levocetirizine (XYZAL) 5 MG tablet TAKE 1 TABLET BY MOUTH EVERY EVENING. **PLEASE CONTACT OUR OFFICE TO SCHEDULE A FOLLOW UP FOR FUTURE MED REFILLS**   No facility-administered medications prior to visit.    Review of Systems  Constitutional:  Negative for chills, fever and malaise/fatigue.  HENT:  Negative for congestion and hearing loss.   Eyes:  Negative for blurred vision and double vision.  Respiratory:  Negative for cough and shortness of breath.   Cardiovascular:  Negative for chest pain, palpitations and leg swelling.  Gastrointestinal:  Negative for abdominal  pain, constipation, diarrhea and heartburn.  Genitourinary:  Negative for frequency and urgency.  Musculoskeletal:  Negative for myalgias and neck pain.  Neurological:  Negative for headaches.  Endo/Heme/Allergies:  Negative for polydipsia.  Psychiatric/Behavioral:  Negative for depression. The patient is not nervous/anxious.       Objective:    BP 98/64   Pulse 93   Ht 5\' 4"  (1.626 m)   Wt 170 lb 1.9 oz (77.2 kg)   SpO2 99%   BMI 29.20 kg/m    Physical Exam Constitutional:      General: She is not in acute distress.    Appearance: Normal  appearance.  HENT:     Head: Normocephalic and atraumatic.     Right Ear: Tympanic membrane, ear canal and external ear normal.     Left Ear: Tympanic membrane, ear canal and external ear normal.     Nose: Nose normal.     Mouth/Throat:     Mouth: Mucous membranes are moist.     Pharynx: No oropharyngeal exudate or posterior oropharyngeal erythema.  Eyes:     Extraocular Movements: Extraocular movements intact.     Conjunctiva/sclera: Conjunctivae normal.     Pupils: Pupils are equal, round, and reactive to light.  Neck:     Thyroid: No thyroid mass, thyromegaly or thyroid tenderness.  Cardiovascular:     Rate and Rhythm: Normal rate and regular rhythm.     Heart sounds: Normal heart sounds. No murmur heard.    No friction rub. No gallop.  Pulmonary:     Effort: Pulmonary effort is normal. No respiratory distress.     Breath sounds: Normal breath sounds. No wheezing, rhonchi or rales.  Abdominal:     General: Abdomen is flat. Bowel sounds are normal. There is no distension.     Palpations: There is no mass.     Tenderness: There is no abdominal tenderness. There is no guarding.  Musculoskeletal:        General: Normal range of motion.     Cervical back: Normal range of motion and neck supple.  Lymphadenopathy:     Cervical: No cervical adenopathy.  Skin:    General: Skin is warm and dry.  Neurological:     Mental Status: She  is alert and oriented to person, place, and time.     Cranial Nerves: No cranial nerve deficit.     Motor: No weakness.     Deep Tendon Reflexes: Reflexes normal.  Psychiatric:        Mood and Affect: Mood normal.     Results for orders placed or performed in visit on 11/01/23  POCT URINALYSIS DIP (CLINITEK)  Result Value Ref Range   Color, UA yellow yellow   Clarity, UA cloudy (A) clear   Glucose, UA negative negative mg/dL   Bilirubin, UA negative negative   Ketones, POC UA negative negative mg/dL   Spec Grav, UA 4.098 1.191 - 1.025   Blood, UA negative negative   pH, UA 7.5 5.0 - 8.0   POC PROTEIN,UA negative negative, trace   Urobilinogen, UA 1.0 0.2 or 1.0 E.U./dL   Nitrite, UA Negative Negative   Leukocytes, UA Small (1+) (A) Negative     Assessment & Plan:    Routine Health Maintenance and Physical Exam  Immunization History  Administered Date(s) Administered   Tdap 06/15/2014, 01/28/2018   Zoster Recombinant(Shingrix) 05/04/2020    Health Maintenance  Topic Date Due   Fecal DNA (Cologuard)  Never done   MAMMOGRAM  Never done   COVID-19 Vaccine (1 - 2024-25 season) 11/17/2023 (Originally 07/15/2023)   Zoster Vaccines- Shingrix (2 of 2) 01/04/2024 (Originally 06/29/2020)   INFLUENZA VACCINE  02/11/2024 (Originally 06/14/2023)   Medicare Annual Wellness (AWV)  10/14/2024   DTaP/Tdap/Td (3 - Td or Tdap) 01/29/2028   Hepatitis C Screening  Completed   HIV Screening  Completed   HPV VACCINES  Aged Out    Reviewed most recent labs including CBC, CMP, lipid panel, A1C, TSH, and vitamin D. All within normal limits/stable from last check other than Triglycerides increased to 219, TSH slightly low 0.324 but normal T4. Zio reassuring,  no arrhythmia. Patient will be sent a new Cologuard kit as the initial sample was not able to be processed.  Discussed health benefits of physical activity, and encouraged her to engage in regular exercise appropriate for her age and  condition.  Wellness examination  Environmental and seasonal allergies -     Levocetirizine Dihydrochloride; TAKE 1 TABLET BY MOUTH EVERY EVENING.  Dispense: 90 tablet; Refill: 1  Other specified hypothyroidism Assessment & Plan: TSH elevated but T4 within normal limits, continue levothyroxine 112 mcg daily and recheck TSH in 6 months.   Mild intermittent reactive airway disease with wheezing without complication Assessment & Plan: Continue montelukast 10 mg daily, use albuterol inhaler for rescue.   Leukocytosis, unspecified type -     POCT URINALYSIS DIP (CLINITEK) -     Urine Culture; Future  UA positive for leukocytes but negative for nitrites, sending for culture to evaluate need for antibiotics.  Return in about 6 months (around 05/01/2024) for follow-upfor triglycerides, thyroid, asthma, fasting blood work 1 week before.     [name], PA

## 2023-11-01 NOTE — Assessment & Plan Note (Signed)
TSH elevated but T4 within normal limits, continue levothyroxine 112 mcg daily and recheck TSH in 6 months.

## 2023-11-01 NOTE — Assessment & Plan Note (Signed)
Continue montelukast 10 mg daily, use albuterol inhaler for rescue.

## 2023-11-04 LAB — URINE CULTURE

## 2023-11-05 ENCOUNTER — Other Ambulatory Visit: Payer: Self-pay | Admitting: Family Medicine

## 2023-11-05 ENCOUNTER — Ambulatory Visit: Payer: Self-pay | Admitting: Licensed Clinical Social Worker

## 2023-11-05 MED ORDER — NITROFURANTOIN MONOHYD MACRO 100 MG PO CAPS: 100.0000 mg | ORAL_CAPSULE | Freq: Two times a day (BID) | ORAL | 0 refills | Status: AC

## 2023-11-05 NOTE — Patient Outreach (Signed)
  Care Coordination   11/05/2023 Name: LILLE VALDEZ MRN: [id number] DOB: 1971-08-22   Care Coordination Outreach Attempts:  An unsuccessful outreach was attempted for an appointment today.  Follow Up Plan:  Additional outreach attempts will be made to offer the patient complex care management information and services.   Encounter Outcome:  No Answer   Care Coordination Interventions:  No, not indicated    Jeanie Cooks, PhD [facility], [facility] Social Worker Direct Dial: [phone]  Fax: [phone]

## 2023-11-08 NOTE — Progress Notes (Signed)
Patient returned called she is advised of her results and recommendation she also stated that she seen Ariel King last week and for the past 2 days she been having congestion and coughing is there something she should be taking

## 2023-11-08 NOTE — Telephone Encounter (Signed)
Copied from CRM [id number]. Topic: Clinical - Lab/Test Results >> Nov 08, 2023 12:52 PM Carlatta H wrote: Reason for CRM: Patient was calling because she received a call this morning//Please call back       Patient was advised of her lab results

## 2023-11-09 ENCOUNTER — Other Ambulatory Visit: Payer: Self-pay | Admitting: Family Medicine

## 2023-11-09 ENCOUNTER — Ambulatory Visit: Payer: BC Managed Care – PPO

## 2023-11-09 MED ORDER — BENZONATATE 200 MG PO CAPS: 200.0000 mg | ORAL_CAPSULE | Freq: Two times a day (BID) | ORAL | 0 refills | Status: DC | PRN

## 2023-11-09 NOTE — Progress Notes (Signed)
Prescription sent in  

## 2023-11-19 ENCOUNTER — Telehealth: Payer: Self-pay

## 2023-11-19 MED ORDER — DEXTROMETHORPHAN-GUAIFENESIN 30-200 MG/5ML PO LIQD: 5.0000 mL | Freq: Two times a day (BID) | ORAL | 0 refills | Status: DC

## 2023-11-19 NOTE — Telephone Encounter (Signed)
 Copied from CRM [id number]. Topic: Clinical - Prescription Issue >> Nov 16, 2023  5:02 PM Delon DASEN wrote: Reason for CRM: having bad side effects from benzonatate  (TESSALON ) 200 MG capsule, it gave her hallucinations and made it feel like she could not breathe, so she stopped taking it. She would like something else for cough.  Please call patient [phone]

## 2023-11-19 NOTE — Addendum Note (Signed)
 Addended by: Saralyn Pilar on: 11/19/2023 08:46 AM   Modules accepted: Orders

## 2023-11-19 NOTE — Telephone Encounter (Signed)
 Sent alternative  Meds ordered this encounter  Medications   Dextromethorphan-guaiFENesin 30-200 MG/5ML LIQD    Sig: Take 5 mLs by mouth every 12 (twelve) hours.    Dispense:  120 mL    Refill:  0    Supervising Provider:   Sandre Kitty [[id number]

## 2023-11-20 ENCOUNTER — Telehealth: Payer: Self-pay | Admitting: *Deleted

## 2023-11-20 NOTE — Progress Notes (Signed)
 Complex Care Management Care Guide Note  11/20/2023 Name: Ariel King MRN: [id number] DOB: [birth year]  [name] is a 53 y.o. year old female who is a primary care patient of Wallace Joesph LABOR, PA and is actively engaged with the care management team. I reached out to [employer] by phone today to assist with re-scheduling  with the BSW.  Follow up plan: Unsuccessful telephone outreach attempt made. A HIPAA compliant phone message was left for the patient providing contact information and requesting a return call.  Thedford Franks, CCMA Care Coordination Care Guide Direct Dial: [phone]

## 2023-11-21 NOTE — Progress Notes (Signed)
 Complex Care Management Care Guide Note  11/21/2023 Name: Ariel King MRN: [id number] DOB: [birth year]  Ariel King is a 53 y.o. year old female who is a primary care patient of Wallace Joesph LABOR, PA and is actively engaged with the care management team. I reached out to [employer] by phone today to assist with re-scheduling  with the BSW.  Follow up plan: Telephone appointment with complex care management team member scheduled for:  12/03/2023  Thedford Franks, [facility] Care Coordination Care Guide Direct Dial: [phone]

## 2023-12-03 ENCOUNTER — Ambulatory Visit: Payer: Self-pay | Admitting: Licensed Clinical Social Worker

## 2023-12-03 NOTE — Patient Outreach (Signed)
  Care Coordination   12/03/2023 Name: Ariel King MRN: [id number] DOB: [birth year]   Care Coordination Outreach Attempts:  An unsuccessful outreach was attempted for an appointment today.  Follow Up Plan:  Additional outreach attempts will be made to offer the patient complex care management information and services.   Encounter Outcome:  No Answer   Care Coordination Interventions:  No, not indicated    Jeanie Cooks, PhD [facility], [facility] Social Worker Direct Dial: [phone]  Fax: [phone]

## 2023-12-10 ENCOUNTER — Telehealth: Payer: Self-pay | Admitting: *Deleted

## 2023-12-10 NOTE — Progress Notes (Signed)
Complex Care Management Care Guide Note  12/10/2023 Name: TANDI HANKO MRN: [id number] DOB: [birth year]  Lowella Grip Briddell is a 53 y.o. year old female who is a primary care patient of [name], PA and is actively engaged with the care management team. I reached out to [employer] by phone today to assist with re-scheduling  with the BSW.  Follow up plan: Unsuccessful telephone outreach attempt made. A HIPAA compliant phone message was left for the patient providing contact information and requesting a return call.  Gwenevere Ghazi  [facility] Health  Value-Based Care Institute, [facility] Guide  Direct Dial: [phone]  Fax [phone]

## 2023-12-12 NOTE — Progress Notes (Signed)
Complex Care Management Care Guide Note  12/12/2023 Name: Ariel King MRN: [id number] DOB: May 16, 1971  Ariel King is a 53 y.o. year old female who is a primary care patient of [name], PA and is actively engaged with the care management team. I reached out to [employer] by phone today to assist with re-scheduling  with the BSW.  Follow up plan: Unsuccessful telephone outreach attempt made. A HIPAA compliant phone message was left for the patient providing contact information and requesting a return call.  Ariel King  [facility] Health  Value-Based Care Institute, [facility] Guide  Direct Dial: [phone]  Fax [phone]

## 2023-12-17 ENCOUNTER — Other Ambulatory Visit: Payer: Self-pay | Admitting: Physical Medicine and Rehabilitation

## 2023-12-17 NOTE — Progress Notes (Signed)
Complex Care Management Care Guide Note  12/17/2023 Name: LEE-ANN GAL MRN: [id number] DOB: 07-07-1971  Lowella Grip Wendel is a 53 y.o. year old female who is a primary care patient of [name], PA and is actively engaged with the care management team. I reached out to [employer] by phone today to assist with re-scheduling  with the Licensed Clinical Child psychotherapist.  Follow up plan: Unsuccessful telephone outreach attempt made. A HIPAA compliant phone message was left for the patient providing contact information and requesting a return call. No further outreach attempts will be made at this time. We have been unable to contact the patient.   Gwenevere Ghazi  [facility] Health  Value-Based Care Institute, [facility] Guide  Direct Dial: [phone]  Fax [phone]

## 2023-12-18 ENCOUNTER — Telehealth: Payer: Self-pay | Admitting: *Deleted

## 2023-12-18 NOTE — Progress Notes (Signed)
 Complex Care Management Note  Care Guide Note 12/18/2023 Name: BREALYN BARIL MRN: [id number] DOB: 1971/05/09  Dickie JINNY Balaguer is a 53 y.o. year old female who sees Wallace Joesph LABOR, [place] for primary care. I reached out to Ivoree J Vokes by phone today to offer complex care management services.  Ms. Tworek was given information about Complex Care Management services today including:   The Complex Care Management services include support from the care team which includes your Nurse Care Manager, Clinical Social Worker, or Pharmacist.  The Complex Care Management team is here to help remove barriers to the health concerns and goals most important to you. Complex Care Management services are voluntary, and the patient may decline or stop services at any time by request to their care team member.   Complex Care Management Consent Status: Patient agreed to services and verbal consent obtained.   Follow up plan:  Telephone appointment with complex care management team member scheduled for:  2/21  Encounter Outcome:  Patient Scheduled  Harlene Satterfield  [facility]  [facility], [facility] Guide  Direct Dial: [phone]  Fax [phone]

## 2023-12-19 ENCOUNTER — Other Ambulatory Visit: Payer: Self-pay | Admitting: Family Medicine

## 2023-12-19 DIAGNOSIS — G894 Chronic pain syndrome: Secondary | ICD-10-CM

## 2023-12-19 NOTE — Progress Notes (Signed)
 Sending new referral to [facility]  [phone] [address], [place] [address] [phone])

## 2023-12-20 ENCOUNTER — Encounter: Payer: Self-pay | Admitting: Family Medicine

## 2023-12-25 ENCOUNTER — Other Ambulatory Visit: Payer: Self-pay | Admitting: Physical Medicine and Rehabilitation

## 2023-12-28 ENCOUNTER — Telehealth: Payer: Self-pay | Admitting: Licensed Clinical Social Worker

## 2023-12-28 NOTE — Patient Outreach (Signed)
  Care Coordination   Returning a phone cal to patient   Visit Note   12/28/2023 Name: Ariel King MRN: [id number] DOB: [birth year]  Ariel King is a 53 y.o. year old female who sees [name], [place] for primary care. I  left a message to confirm appointment for Friday 01/05/2024 at 1:00 pm. SW returned call from a message left from patient.   What matters to the patients health and wellness today?  To confirm appointment for nest week     Goals Addressed   None     SDOH assessments and interventions completed:  No     Care Coordination Interventions:  No, not indicated   Follow up plan: 01/04/2024 st 1:00 pm   Encounter Outcome:  Patient Visit Completed   Jeanie Cooks, PhD [facility], [facility] Social Worker Direct Dial: [phone]  Fax: [phone]

## 2024-01-04 ENCOUNTER — Ambulatory Visit: Payer: Self-pay | Admitting: Licensed Clinical Social Worker

## 2024-01-04 NOTE — Patient Outreach (Signed)
Care Coordination   01/04/2024 Name: Ariel King MRN: [id number] DOB: Jan 27, 1971   Care Coordination Outreach Attempts:  An unsuccessful outreach was attempted for an appointment today.  Follow Up Plan:  Additional outreach attempts will be made to offer the patient complex care management information and services.   Encounter Outcome:  No Answer   Care Coordination Interventions:  No, not indicated    Jeanie Cooks, PhD [facility], [facility] Social Worker Direct Dial: [phone]  Fax: [phone]

## 2024-01-09 NOTE — Progress Notes (Signed)
  Plan: 3 or more unsuccessful contact attempts have been made by BSW. The care management team will attempt to engage the patient again when/if another referral is made.  Closing referral     Gwenevere Ghazi  [facility] Health  [facility], [facility] Guide  Direct Dial: [phone]  Fax [phone]

## 2024-01-14 ENCOUNTER — Telehealth: Payer: Self-pay | Admitting: *Deleted

## 2024-01-14 NOTE — Progress Notes (Signed)
 Complex Care Management Care Guide Note  01/14/2024 Name: MAYU RONK MRN: [id number] DOB: 1971/07/16  Lowella Grip Wordell is a 53 y.o. year old female who is a primary care patient of [name], PA and is actively engaged with the care management team.  Lowella Grip Stringfellow called by phone today to assist with re-scheduling  with the BSW.  Follow up plan: Telephone appointment with complex care management team member scheduled for:  3/19  Gwenevere Ghazi  [facility] Health  Value-Based Care Institute, [facility] Guide  Direct Dial: [phone]  Fax [phone]

## 2024-01-16 ENCOUNTER — Other Ambulatory Visit

## 2024-01-17 DIAGNOSIS — M5416 Radiculopathy, lumbar region: Secondary | ICD-10-CM | POA: Diagnosis not present

## 2024-01-27 DIAGNOSIS — M4316 Spondylolisthesis, lumbar region: Secondary | ICD-10-CM | POA: Diagnosis not present

## 2024-01-27 DIAGNOSIS — M48061 Spinal stenosis, lumbar region without neurogenic claudication: Secondary | ICD-10-CM | POA: Diagnosis not present

## 2024-01-27 DIAGNOSIS — M5116 Intervertebral disc disorders with radiculopathy, lumbar region: Secondary | ICD-10-CM | POA: Diagnosis not present

## 2024-01-27 DIAGNOSIS — M4726 Other spondylosis with radiculopathy, lumbar region: Secondary | ICD-10-CM | POA: Diagnosis not present

## 2024-01-30 ENCOUNTER — Ambulatory Visit: Payer: Self-pay | Admitting: Licensed Clinical Social Worker

## 2024-01-30 NOTE — Patient Outreach (Signed)
 Care Coordination   Initial Visit Note   01/30/2024 Name: BRESHAY ILG MRN: [id number] DOB: 09-19-1971  Lowella Grip Brenner is a 53 y.o. year old female who sees [name], [place] for primary care. I spoke with  Lowella Grip Januszewski by phone today.  What matters to the patients health and wellness today?  Housing    Goals Addressed             This Visit's Progress    Care Coordination Activities       Care Coordination Interventions: Patient stated that she lives in the basement of her parents house and wants her own place and wanted to get information about Section 8 or HUD, Sw will mail the information and section 8 application  Patient also wants a raised commode and a shower bar, patient is going to call the PCP to get order to see if the insurance will cover. SW went over the SDOH assessment and no other needs at this time.\ SW (A.Shields) will follow up on 02/14/2024 at 11:15        SDOH assessments and interventions completed:  Yes  SDOH Interventions Today    Flowsheet Row Most Recent Value  SDOH Interventions   Food Insecurity Interventions Intervention Not Indicated  [Patient receives $300 on her Ucard]  Housing Interventions Intervention Not Indicated  [Patient is living in the basement o fher paretns house and wants her own place, wants to apply for section 8]  Transportation Interventions Intervention Not Indicated  Utilities Interventions Intervention Not Indicated        Care Coordination Interventions:  Yes, provided  Interventions Today    Flowsheet Row Most Recent Value  General Interventions   General Interventions Discussed/Reviewed General Interventions Discussed, Community Resources  [SW will mail the section 8 application and housing list to patient]        Follow up plan: Follow up call scheduled for 02/14/2024 at 11:15 am with SW A. Shileds    Encounter Outcome:  Patient Visit Completed   Jeanie Cooks, PhD [facility], [facility] Social Worker Direct Dial: [phone]  Fax: [phone]

## 2024-01-30 NOTE — Patient Instructions (Signed)
 Visit Information  Thank you for taking time to visit with me today. Please don't hesitate to contact me if I can be of assistance to you.   Following are the goals we discussed today:   Goals Addressed             This Visit's Progress    Care Coordination Activities       Care Coordination Interventions: Patient stated that she lives in the basement of her parents house and wants her own place and wanted to get information about Section 8 or HUD, Sw will mail the information and section 8 application  Patient also wants a raised commode and a shower bar, patient is going to call the PCP to get order to see if the insurance will cover. SW went over the SDOH assessment and no other needs at this time.\ SW (A.Shields) will follow up on 02/14/2024 at 11:15        Our next appointment is by telephone on 02/14/2024 at 11:15 am  Please call the care guide team at [phone] if you need to cancel or reschedule your appointment.   If you are experiencing a Mental Health or Behavioral Health Crisis or need someone to talk to, please call the Suicide and Crisis Lifeline: 988 go to [facility] Urgent [facility] [address], [place] [id number]) call 911  Patient verbalizes understanding of instructions and care plan provided today and agrees to view in MyChart. Active MyChart status and patient understanding of how to access instructions and care plan via MyChart confirmed with patient.     Jeanie Cooks, PhD [facility], [facility] Social Worker Direct Dial: [phone]  Fax: [phone]

## 2024-02-14 ENCOUNTER — Other Ambulatory Visit: Payer: Self-pay

## 2024-02-14 NOTE — Patient Instructions (Signed)
 Visit Information  The Patient                                              was given information about Medicaid Managed Care team care coordination services and consented to engagement with the [facility] Managed Care team.   The  Patient                                              has been provided with contact information for the Managed Medicaid care management team and has been advised to call with any health related questions or concerns.   Abelino Derrick, MHA [facility]  Managed [facility] Social Worker [phone]

## 2024-02-14 NOTE — Patient Outreach (Addendum)
 Medicaid Managed Care Social Work Note  02/14/2024 Name:  Ariel King MRN:  [id number] DOB:  1971/05/14  Ariel King is an 53 y.o. year old female who is a primary patient of Ariel King, [place].  The Medicaid Managed Care Coordination team was consulted for assistance with:   Housing resources  Ms. Rice was given information about Medicaid Managed Care Coordination team services today. Ariel King Patient agreed to services and verbal consent obtained.  Engaged with patient  for by telephone forinitial visit in response to referral for case management and/or care coordination services.   Patient is participating in a Managed Medicaid Plan:  Yes  Assessments/Interventions:  Review of past medical history, allergies, medications, health status, including review of consultants reports, laboratory and other test data, was performed as part of comprehensive evaluation and provision of chronic care management services.  SDOH: (Social Drivers of Health) assessments and interventions performed: SDOH Interventions    Flowsheet Row Care Coordination from 01/30/2024 in [facility] Care Coordination Clinical Support from 10/15/2023 in [facility] at [facility] Office Visit from 04/04/2023 in [facility] Primary Care at [facility] Visit from 11/22/2020 in [facility] Care at [facility]  SDOH Interventions      Food Insecurity Interventions Intervention Not Indicated  [Patient receives $300 on her Ucard] Intervention Not Indicated -- --  Housing Interventions Intervention Not Indicated  [Patient is living in the basement o fher paretns house and wants her own place, wants to apply for section 8] Intervention Not Indicated -- --  Transportation Interventions Intervention Not Indicated Intervention Not Indicated -- --  Utilities Interventions Intervention Not Indicated Intervention Not Indicated -- --  Alcohol Usage Interventions --  Intervention Not Indicated (Score <7) -- --  Depression Interventions/Treatment  -- Currently on Treatment Currently on Treatment Currently on Treatment  Financial Strain Interventions -- Intervention Not Indicated -- --  Physical Activity Interventions -- Patient Declined -- --  Stress Interventions -- Other (Comment)  [medication helps] -- --  Social Connections Interventions -- Intervention Not Indicated -- --  Health Literacy Interventions -- Intervention Not Indicated -- --      BSW completed a telephone outreach with patient to follow up on resources. Patient states she did not receive the resources sent and she has not had the chance to contact her PCP. BSW resent resources to patient at [email or web address]. No other resources are needed at this time. BSW provided contact information for any future needs.  Advanced Directives Status:  Not addressed in this encounter.  Care Plan                 Allergies  Allergen Reactions   Naltrexone Other (See Comments)    Panic attack    Medications Reviewed Today   Medications were not reviewed in this encounter     Patient Active Problem List   Diagnosis Date Noted   Emotional crisis as acute reaction to exceptional (gross) stress 10/15/2019   Post-trauma response 10/15/2019   Environmental and seasonal allergies 04/22/2019   Reactive airway disease with wheezing 05/21/2018   Tobacco consumption 05/21/2018   Achilles tendinitis of both lower extremities 05/21/2018   Chronic right-sided low back pain with right-sided sciatica 05/22/2017   Lumbar spine strain, initial encounter 03/22/2017   Panic attacks 06/09/2016   Vitamin D deficiency 06/09/2016   Hypothyroidism 12/19/2015   Obesity (BMI 30-39.9) 11/03/2013   Chronic pain syndrome 12/25/2012   h/o Blood  glucose elevated 12/25/2012   h/o Hypertriglyceridemia 12/25/2012   Other insomnia 12/25/2012   Chronic fatigue, unspecified 12/25/2012   Anxiety 03/14/2012    Chronic fatigue disorder 03/14/2012   Migraine without aura and without status migrainosus, not intractable 03/14/2012   Severe episode of recurrent major depressive disorder, without psychotic features (HCC) 03/14/2012    Conditions to be addressed/monitored per PCP order:   Housing   There are no care plans that you recently modified to display for this patient.   Follow up:  Patient agrees to Care Plan and Follow-up.  Plan: The  Patient has been provided with contact information for the Managed Medicaid care management team and has been advised to call with any health related questions or concerns.   Abelino Derrick, MHA [facility]  Managed [facility] Social Worker [phone]

## 2024-02-20 DIAGNOSIS — M5416 Radiculopathy, lumbar region: Secondary | ICD-10-CM | POA: Diagnosis not present

## 2024-02-28 ENCOUNTER — Other Ambulatory Visit: Payer: Self-pay | Admitting: Physical Medicine and Rehabilitation

## 2024-04-21 ENCOUNTER — Other Ambulatory Visit: Payer: Self-pay | Admitting: Family Medicine

## 2024-04-21 DIAGNOSIS — E038 Other specified hypothyroidism: Secondary | ICD-10-CM

## 2024-04-21 DIAGNOSIS — J3089 Other allergic rhinitis: Secondary | ICD-10-CM

## 2024-04-25 ENCOUNTER — Other Ambulatory Visit: Payer: 59

## 2024-04-25 ENCOUNTER — Other Ambulatory Visit

## 2024-05-06 ENCOUNTER — Ambulatory Visit: Payer: 59 | Admitting: Family Medicine

## 2024-05-14 ENCOUNTER — Ambulatory Visit: Payer: Self-pay

## 2024-05-14 NOTE — Telephone Encounter (Signed)
 FYI Only or Action Required?: Action required by provider: clinical question for provider.  Called Nurse Triage reporting Urine Output. Symptoms began several days ago. Interventions attempted: Nothing. Symptoms are: gradually worsening.  Triage Disposition: See Physician Within 24 Hours  Patient/caregiver understands and will follow disposition?: Unsure, pt advised to utilize UC, pt would like to come to clinic and give sample.   Copied from CRM [id number]. Topic: Clinical - Red Word Triage >> May 14, 2024  2:41 PM Winona R wrote: Back pain, change in urine smell/ Odor, urinating less than usual. -- [facility], [place] Reason for Disposition  Side (flank) or lower back pain present  Answer Assessment - Initial Assessment Questions 1. SYMPTOM: What's the main symptom you're concerned about? (e.g., frequency, incontinence)     Less urine than normal, back pain,  2. ONSET: When did it start?     Too long, should have already had it checked 3. PAIN: Is there any pain? If Yes, ask: How bad is it? (Scale: 1-10; mild, moderate, severe)     Back pain-9 4. CAUSE: What do you think is causing the symptoms?     UTI 5. OTHER SYMPTOMS: Do you have any other symptoms? (e.g., blood in urine, fever, flank pain, pain with urination)     Odorous urine, back pain, less urine output than normal  Protocols used: Urinary Symptoms-A-AH

## 2024-05-14 NOTE — Telephone Encounter (Signed)
 Hazel called and left a message to ask about below. Also sent a mychart message.

## 2024-05-15 ENCOUNTER — Ambulatory Visit
Admission: EM | Admit: 2024-05-15 | Discharge: 2024-05-15 | Disposition: A | Discharge status: Home / Self Care | Attending: Emergency Medicine | Admitting: Emergency Medicine

## 2024-05-15 DIAGNOSIS — R109 Unspecified abdominal pain: Secondary | ICD-10-CM | POA: Diagnosis not present

## 2024-05-15 DIAGNOSIS — N3001 Acute cystitis with hematuria: Secondary | ICD-10-CM

## 2024-05-15 LAB — POCT URINALYSIS DIP (MANUAL ENTRY)
Bilirubin, UA: NEGATIVE
Glucose, UA: NEGATIVE mg/dL
Ketones, POC UA: NEGATIVE mg/dL
Nitrite, UA: POSITIVE — AB
Protein Ur, POC: 30 mg/dL — AB
Spec Grav, UA: 1.025 (ref 1.010–1.025)
Urobilinogen, UA: 0.2 U/dL
pH, UA: 7 (ref 5.0–8.0)

## 2024-05-15 MED ORDER — KETOROLAC TROMETHAMINE 30 MG/ML IJ SOLN
30.0000 mg | Freq: Once | INTRAMUSCULAR | Status: AC
  Administered 2024-05-15: 30 mg via INTRAMUSCULAR

## 2024-05-15 MED ORDER — IBUPROFEN 800 MG PO TABS
800.0000 mg | ORAL_TABLET | Freq: Three times a day (TID) | ORAL | 0 refills | Status: AC
Start: 2024-05-15 — End: ?

## 2024-05-15 MED ORDER — SULFAMETHOXAZOLE-TRIMETHOPRIM 800-160 MG PO TABS: 1.0000 | ORAL_TABLET | Freq: Two times a day (BID) | ORAL | 0 refills | Status: AC

## 2024-05-15 NOTE — ED Provider Notes (Signed)
 [facility]    CSN: [id number] Arrival date & time: 05/15/24  1656      History   Chief Complaint Chief Complaint  Patient presents with   Dysuria    HPI Ariel King is a 53 y.o. female.   Patient presents to clinic over concern of bilateral lower back pain, foul-smelling urine and diminished urinary output for around the past 2 weeks.  She is not having any urgency, frequency or dysuria.  Has had hot and cold chills, unsure if fever.  Without nausea, vomiting or diarrhea.  Without abdominal pain.  The history is provided by the patient and medical records.  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)    Chronic kidney disease    Depression    Depression    EBV infection    H/O bladder infections    H/O joint problems    Hx of migraines    Incontinence    Thyroid  disease     Patient Active Problem List   Diagnosis Date Noted   Emotional crisis as acute reaction to exceptional (gross) stress 10/15/2019   Post-trauma response 10/15/2019   Environmental and seasonal allergies 04/22/2019   Reactive airway disease with wheezing 05/21/2018   Tobacco consumption 05/21/2018   Achilles tendinitis of both lower extremities 05/21/2018   Chronic right-sided low back pain with right-sided sciatica 05/22/2017   Lumbar spine strain, initial encounter 03/22/2017   Panic attacks 06/09/2016   Vitamin D  deficiency 06/09/2016   Hypothyroidism 12/19/2015   Obesity (BMI 30-39.9) 11/03/2013   Chronic pain syndrome 12/25/2012   h/o Blood glucose elevated 12/25/2012   h/o Hypertriglyceridemia 12/25/2012   Other insomnia 12/25/2012   Chronic fatigue, unspecified 12/25/2012   Anxiety 03/14/2012   Chronic fatigue disorder 03/14/2012   Migraine without aura and without status migrainosus, not intractable  03/14/2012   Severe episode of recurrent major depressive disorder, without psychotic features (HCC) 03/14/2012    Past Surgical History:  Procedure Laterality Date   SYNOVECTOMY WRIST     TOTAL VAGINAL HYSTERECTOMY      OB History     Gravida  2   Para  2   Term  2   Preterm      AB      Living  2      SAB      IAB      Ectopic      Multiple      Live Births               Home Medications    Prior to Admission medications   Medication Sig Start Date End Date Taking? Authorizing Provider  ibuprofen  (ADVIL ) 800 MG tablet Take 1 tablet (800 mg total) by mouth 3 (three) times daily. 05/15/24  Yes Kaspian Muccio  N, FNP  sulfamethoxazole-trimethoprim (BACTRIM DS) 800-160 MG tablet Take 1 tablet by mouth 2 (two) times daily for 3 days. 05/15/24 05/18/24 Yes Krystan Northrop  N, FNP  albuterol  (VENTOLIN  HFA) 108 (90 Base) MCG/ACT inhaler INHALE 1 TO 2 PUFFS INTO THE LUNGS EVERY 4 HOURS AS NEEDED FOR WHEEZING OR SHORTNESS OF BREATH 04/04/23   Wallace Joesph LABOR, PA  ALPRAZolam (XANAX) 1 MG tablet Take by mouth. 10/12/22   [provider]  amphetamine-dextroamphetamine (ADDERALL) 20 MG tablet  Take 1 tablet by mouth 3 (three) times daily. 02/15/16   [provider]  Dextromethorphan -guaiFENesin  30-200 MG/5ML LIQD Take 5 mLs by mouth every 12 (twelve) hours. 11/19/23   Wallace Joesph LABOR, PA  diclofenac  Sodium (VOLTAREN ) 1 % GEL Apply 2 g topically 4 (four) times daily. 04/04/23   Wallace Joesph LABOR, PA  DULoxetine  (CYMBALTA ) 20 MG capsule TAKE 1 CAPSULE BY MOUTH TWICE A DAY 06/20/23   Raulkar, Sven SQUIBB, MD  fluticasone  (FLONASE ) 50 MCG/ACT nasal spray Place 2 sprays into both nostrils daily. 04/04/23   Wallace Joesph LABOR, PA  gabapentin  (NEURONTIN ) 600 MG tablet Take 600 mg by mouth 4 (four) times daily. 09/18/23   [provider]  levocetirizine (XYZAL ) 5 MG tablet TAKE 1 TABLET BY MOUTH EVERY EVENING. 04/21/24   Gayle Saddie FALCON, PA-C  levothyroxine  (SYNTHROID )  112 MCG tablet TAKE 1 TABLET (112 MCG TOTAL) BY MOUTH DAILY. 04/21/24   Gayle Saddie F, PA-C  methocarbamol  (ROBAXIN -750) 750 MG tablet Take 1 tablet (750 mg total) by mouth 4 (four) times daily. 08/03/23   Raulkar, Sven SQUIBB, MD  montelukast  (SINGULAIR ) 10 MG tablet Take 1 tablet (10 mg total) by mouth at bedtime. 04/04/23   Wallace Joesph LABOR, PA  QUEtiapine  Fumarate (SEROQUEL  XR) 150 MG 24 hr tablet Take 1 tablet (150 mg total) by mouth at bedtime. 08/03/23   Raulkar, Sven SQUIBB, MD  topiramate  (TOPAMAX ) 25 MG tablet TAKE 1 TABLET BY MOUTH FIVE TIMES DAILY AS NEEDED FOR PAIN. 08/10/23   Raulkar, Sven SQUIBB, MD  traZODone (DESYREL) 100 MG tablet Take 3 tablets by mouth at bedtime.  03/01/16   [provider]  venlafaxine XR (EFFEXOR-XR) 150 MG 24 hr capsule Take 300 mg by mouth every morning.    [provider]  Vitamin D , Ergocalciferol , (DRISDOL ) 1.25 MG (50000 UNIT) CAPS capsule TAKE 1 CAPSULE (50,000 UNITS TOTAL) BY MOUTH EVERY 7 (SEVEN) DAYS. 03/21/23   Raulkar, Sven SQUIBB, MD    Family History Family History  Problem Relation Age of Onset   Diabetes Mother    Migraines Mother    Heart attack Mother    Depression Mother    Hypertension Mother    Alcohol abuse Father    Cancer Father        prostate   Depression Sister     Social History Social History   Tobacco Use   Smoking status: Former    Types: Cigarettes    Passive exposure: Past   Smokeless tobacco: Never   Tobacco comments:    quit 10 years ago  Vaping Use   Vaping status: Never Used  Substance Use Topics   Alcohol use: No   Drug use: No     Allergies   Naltrexone   Review of Systems Review of Systems  Per HPI  Physical Exam Triage Vital Signs ED Triage Vitals  Encounter Vitals Group     BP 05/15/24 1719 105/70     Girls Systolic BP Percentile --      Girls Diastolic BP Percentile --      Boys Systolic BP Percentile --      Boys Diastolic BP Percentile --      Pulse Rate 05/15/24 1719 (!)  102     Resp 05/15/24 1719 16     Temp 05/15/24 1719 98 F (36.7 C)     Temp Source 05/15/24 1719 Oral     SpO2 05/15/24 1719 93 %     Weight --  Height --      Head Circumference --      Peak Flow --      Pain Score 05/15/24 1721 8     Pain Loc --      Pain Education --      Exclude from Growth Chart --    No data found.  Updated Vital Signs BP 105/70 (BP Location: Right Arm)   Pulse (!) 102   Temp 98 F (36.7 C) (Oral)   Resp 16   SpO2 93%   Visual Acuity Right Eye Distance:   Left Eye Distance:   Bilateral Distance:    Right Eye Near:   Left Eye Near:    Bilateral Near:     Physical Exam Vitals and nursing note reviewed.  Constitutional:      Appearance: Normal appearance.  HENT:     Head: Normocephalic and atraumatic.     Right Ear: External ear normal.     Left Ear: External ear normal.     Nose: Nose normal.     Mouth/Throat:     Mouth: Mucous membranes are moist.  Eyes:     Conjunctiva/sclera: Conjunctivae normal.  Cardiovascular:     Rate and Rhythm: Normal rate.  Pulmonary:     Effort: Pulmonary effort is normal. No respiratory distress.  Abdominal:     Tenderness: There is no right CVA tenderness or left CVA tenderness.  Neurological:     General: No focal deficit present.     Mental Status: She is alert.  Psychiatric:        Mood and Affect: Mood normal.      UC Treatments / Results  Labs (all labs ordered are listed, but only abnormal results are displayed) Labs Reviewed  POCT URINALYSIS DIP (MANUAL ENTRY) - Abnormal; Notable for the following components:      Result Value   Clarity, UA cloudy (*)    Blood, UA trace-intact (*)    Protein Ur, POC =30 (*)    Nitrite, UA Positive (*)    Leukocytes, UA Small (1+) (*)    All other components within normal limits  URINE CULTURE    EKG   Radiology No results found.  Procedures Procedures (including critical care time)  Medications Ordered in UC Medications  ketorolac  (TORADOL) 30 MG/ML injection 30 mg (has no administration in time range)    Initial Impression / Assessment and Plan / UC Course  I have reviewed the triage vital signs and the nursing notes.  Pertinent labs & imaging results that were available during my care of the patient were reviewed by me and considered in my medical decision making (see chart for details).  Vitals in triage reviewed, patient is hemodynamically stable.  Afebrile without CVA tenderness.  Low concern for pyelonephritis at this time.  Does have flank pain and UA is positive for nitrites, leukocytes and red blood cells.  Will treat for Bactrim for acute cystitis and send for culture.  Patient reports flank pain and would like treatment for pain, will treat with IM Toradol encourage ibuprofen  starting tomorrow.  Plan of care, follow-up care return precautions given, no questions at this time.     Final Clinical Impressions(s) / UC Diagnoses   Final diagnoses:  Acute cystitis with hematuria  Flank pain     Discharge Instructions      Take the antibiotics twice daily with food for the next 3 days to treat your urinary tract infection.  We are sending  a urine off for culture and we will contact you if we need to modify your antibiotic therapy.  We have given you an injection of Toradol today to help with your pain and inflammation.  Starting tomorrow if you have any pain you can take 8 or milligrams of ibuprofen  every 8 hours as needed.  Ensure you are drinking at least 64 ounces of water daily.  Symptoms should improve over the next few days with antibiotics, if no improvement or any changes please follow-up with your primary care provider or return to clinic for reevaluation.     ED Prescriptions     Medication Sig Dispense Auth. Provider   sulfamethoxazole-trimethoprim (BACTRIM DS) 800-160 MG tablet Take 1 tablet by mouth 2 (two) times daily for 3 days. 6 tablet Dreama, Ashur Glatfelter  N, FNP   ibuprofen  (ADVIL ) 800 MG  tablet Take 1 tablet (800 mg total) by mouth 3 (three) times daily. 21 tablet Dreama, Adaora Mchaney  N, FNP      PDMP not reviewed this encounter.   Dreama, Laren Whaling  N, FNP 05/15/24 1755

## 2024-05-15 NOTE — ED Triage Notes (Signed)
 Pt states she thinks she has a UTI states lower back pain with foul smelling urine for the past 2 weeks.

## 2024-05-15 NOTE — Discharge Instructions (Signed)
 Take the antibiotics twice daily with food for the next 3 days to treat your urinary tract infection.  We are sending a urine off for culture and we will contact you if we need to modify your antibiotic therapy.  We have given you an injection of Toradol today to help with your pain and inflammation.  Starting tomorrow if you have any pain you can take 8 or milligrams of ibuprofen  every 8 hours as needed.  Ensure you are drinking at least 64 ounces of water daily.  Symptoms should improve over the next few days with antibiotics, if no improvement or any changes please follow-up with your primary care provider or return to clinic for reevaluation.

## 2024-05-16 ENCOUNTER — Telehealth: Payer: Self-pay

## 2024-05-16 DIAGNOSIS — N3001 Acute cystitis with hematuria: Secondary | ICD-10-CM

## 2024-05-16 NOTE — Telephone Encounter (Signed)
 Attempted to reach out to patient, No answer. No identified VM.

## 2024-05-16 NOTE — Telephone Encounter (Signed)
 I have reordered her urine culture, but she is already appropriately on antibiotics. Please call her and have her come back to recollect the urine for culture if her symptoms are not improving.

## 2024-05-16 NOTE — Telephone Encounter (Signed)
 Received call from Dajay at [facility]) who rec'd 2 (UA) lab samples for this patient and 1 requisition.   The lab would like this recollected and resent. Message sent to provider in clinic today for review.  WENDI Dixon CMA

## 2024-05-26 ENCOUNTER — Ambulatory Visit: Payer: Self-pay

## 2024-05-26 ENCOUNTER — Telehealth: Payer: Self-pay | Admitting: Emergency Medicine

## 2024-05-26 NOTE — Telephone Encounter (Signed)
 Returned pt's phone call   Pt st's she is having symptoms of UTI again but st's she has appt with her MD tomorrow.  Advised pt to call or return to Urgent Care as needed.  Pt voices understanding

## 2024-05-26 NOTE — Telephone Encounter (Signed)
 FYI Only or Action Required?: Action required by provider: request for appointment.    Called Nurse Triage reporting Dysuria.  Symptoms began several weeks ago.  Interventions attempted: ibuprofen .  Symptoms are: gradually worsening.  Triage Disposition: See HCP Within 4 Hours (Or PCP Triage)  Patient/caregiver understands and will follow disposition?: YesCopied from CRM [id number]. Topic: Clinical - Red Word Triage >> May 26, 2024  3:32 PM Jayma L wrote: Red Word that prompted transfer to Nurse Triage: patient called in and stated she has a bladder is recently seen for this but since she has ran out of medicine and she have the urge to urinate more but nothing will come out and she is having back pain that is getting worse as well Reason for Disposition  Side (flank) or lower back pain present  Answer Assessment - Initial Assessment Questions 1. SYMPTOM: What's the main symptom you're concerned about? (e.g., frequency, incontinence)     urgency  3. PAIN: Is there any pain? If Yes, ask: How bad is it? (Scale: 1-10; mild, moderate, severe)     8 4. CAUSE: What do you think is causing the symptoms?     UTI 5. OTHER SYMPTOMS: Do you have any other symptoms? (e.g., blood in urine, fever, flank pain, pain with urination)     Back pain    Pt went to UC on 7/3 with positive UTI. Pt is waiting on culture results. Pt took last abx but is unsure of date. Pt stated she took at as prescribed. UTI went away and came back this morning . No office appts. RN advised pt go back to same UC to be evaluated. Pt stated she will go back.  Protocols used: Urinary Symptoms-A-AH

## 2024-05-26 NOTE — Telephone Encounter (Signed)
 Contacted pt and she is coming in for an appt tomorrow.

## 2024-05-26 NOTE — Telephone Encounter (Signed)
 LVM for pt to call office to see about scheduling an appointment.  If she calls back please contact the office for us  to speak to patient.

## 2024-05-27 ENCOUNTER — Other Ambulatory Visit (HOSPITAL_COMMUNITY)
Admission: RE | Admit: 2024-05-27 | Discharge: 2024-05-27 | Disposition: A | Discharge status: Home / Self Care | Source: Ambulatory Visit

## 2024-05-27 ENCOUNTER — Other Ambulatory Visit: Payer: Self-pay

## 2024-05-27 ENCOUNTER — Ambulatory Visit (INDEPENDENT_AMBULATORY_CARE_PROVIDER_SITE_OTHER)

## 2024-05-27 VITALS — BP 107/68 | HR 93 | Temp 97.9°F | Ht 64.0 in | Wt 184.0 lb

## 2024-05-27 DIAGNOSIS — R300 Dysuria: Secondary | ICD-10-CM | POA: Diagnosis present

## 2024-05-27 DIAGNOSIS — E559 Vitamin D deficiency, unspecified: Secondary | ICD-10-CM

## 2024-05-27 DIAGNOSIS — E669 Obesity, unspecified: Secondary | ICD-10-CM

## 2024-05-27 DIAGNOSIS — Z130 Encounter for screening for diseases of the blood and blood-forming organs and certain disorders involving the immune mechanism: Secondary | ICD-10-CM

## 2024-05-27 LAB — POCT URINALYSIS DIPSTICK
Bilirubin, UA: NEGATIVE
Glucose, UA: NEGATIVE
Ketones, UA: NEGATIVE
Leukocytes, UA: NEGATIVE
Nitrite, UA: NEGATIVE
Protein, UA: NEGATIVE
Spec Grav, UA: >=1.03 — AB (ref 1.010–1.025)
Urobilinogen, UA: 0.2 U/dL
pH, UA: 6 (ref 5.0–8.0)

## 2024-05-27 MED ORDER — PHENAZOPYRIDINE HCL 95 MG PO TABS: 95.0000 mg | ORAL_TABLET | Freq: Three times a day (TID) | ORAL | 0 refills | Status: DC | PRN

## 2024-05-27 NOTE — Progress Notes (Signed)
 Established Patient Office Visit  Subjective   Patient ID: Ariel King, female    DOB: [birth year]  Age: 53 y.o. MRN: [id number]  Chief Complaint  Patient presents with   Urinary Tract Infection    Onset: Meds taken:     HPI  Ariel King is a 53 y.o. y/o female who presents to the clinic today with dysuria. Was seen at [facility] on 05/15/2024 and diagnosed with UTI. Urine culture sample was never received by lab and patient did not follow up after they called to repeat the urine culture. She was empirically treated with Bactrim , however since culture was never done, it is unknown whether this was sufficient to treat her infection. Reports that she did finish the course of medication. Today she reports that initially her symptoms improved but yesterday she noticed that when she urinates, she feels that she is not fully emptying her bladder. Denies fevers. Endorses back pain, however reports that it is very low on her back and into the bilateral buttocks. Denies discharge, vaginal/vulva itching, foul odors, gross blood in the urine, etc. Reports that she is sexually active with one female partner. Does not use condoms. Is okay with STI testing today to rule out other causes of her symptoms.     ROS Per HPI.    Objective:     BP 107/68   Pulse 93   Temp 97.9 F (36.6 C) (Oral)   Ht 5' 4 (1.626 m)   Wt 184 lb (83.5 kg)   SpO2 96%   BMI 31.58 kg/m    Physical Exam Constitutional:      General: She is not in acute distress.    Appearance: Normal appearance.  Cardiovascular:     Rate and Rhythm: Normal rate and regular rhythm.     Heart sounds: Normal heart sounds. No murmur heard.    No friction rub. No gallop.  Pulmonary:     Effort: Pulmonary effort is normal. No respiratory distress.     Breath sounds: Normal breath sounds.  Abdominal:     Tenderness: There is abdominal tenderness in the suprapubic area. There is no right CVA tenderness or left CVA tenderness.   Musculoskeletal:        General: No swelling.  Skin:    General: Skin is warm and dry.  Neurological:     General: No focal deficit present.     Mental Status: She is alert.  Psychiatric:        Mood and Affect: Mood normal.        Behavior: Behavior normal.        Thought Content: Thought content normal.     Results for orders placed or performed in visit on 05/27/24  POCT Urinalysis Dipstick  Result Value Ref Range   Color, UA yellow    Clarity, UA turbid    Glucose, UA Negative Negative   Bilirubin, UA negative    Ketones, UA negative    Spec Grav, UA >=1.030 (A) 1.010 - 1.025   Blood, UA trace    pH, UA 6.0 5.0 - 8.0   Protein, UA Negative Negative   Urobilinogen, UA 0.2 0.2 or 1.0 E.U./dL   Nitrite, UA negative    Leukocytes, UA Negative Negative   Appearance     Odor        The 10-year ASCVD risk score (Arnett DK, et al., 2019) is: 1.3%    Assessment & Plan:   Dysuria Assessment & Plan: UA today unremarkable  besides trace lyced blood. Will order culture as TOC from previous UTI 2 weeks ago since she completed the course of Bactrim . Encouraged her to increase her water intake and also provided a course of Pyridium  to help relieve bladder related pain. Patient advised that the medication will cause discoloration of her urine.  Given that she is sexually active, have also sent off a self vaginal swab for GC/CZ/trich/BV/yeast to rule out other causes. Advised patient that I will follow up with her regarding the results and plan when her tests come back. Advised her to follow up if her discomfort persists after 72 hours or she begins to experience a fever or other urinary symptoms. Patient verbalized understanding as was in agreement with the plan.  Orders: -     POCT urinalysis dipstick -     Phenazopyridine  HCl; Take 1 tablet (95 mg total) by mouth 3 (three) times daily as needed for pain.  Dispense: 10 tablet; Refill: 0 -     Urine Culture; Future -      Cervicovaginal ancillary only    No follow-ups on file.    Ariel JULIANNA Sacks, PA-C

## 2024-05-27 NOTE — Assessment & Plan Note (Signed)
 UA today unremarkable besides trace lyced blood. Will order culture as TOC from previous UTI 2 weeks ago since she completed the course of Bactrim . Encouraged her to increase her water intake and also provided a course of Pyridium  to help relieve bladder related pain. Patient advised that the medication will cause discoloration of her urine.  Given that she is sexually active, have also sent off a self vaginal swab for GC/CZ/trich/BV/yeast to rule out other causes. Advised patient that I will follow up with her regarding the results and plan when her tests come back. Advised her to follow up if her discomfort persists after 72 hours or she begins to experience a fever or other urinary symptoms. Patient verbalized understanding as was in agreement with the plan.

## 2024-05-28 LAB — CERVICOVAGINAL ANCILLARY ONLY
Bacterial Vaginitis (gardnerella): POSITIVE — AB
Candida Glabrata: NEGATIVE
Candida Vaginitis: NEGATIVE
Chlamydia: NEGATIVE
Comment: NEGATIVE
Comment: NEGATIVE
Comment: NEGATIVE
Comment: NEGATIVE
Comment: NEGATIVE
Comment: NORMAL
Neisseria Gonorrhea: NEGATIVE
Trichomonas: NEGATIVE

## 2024-05-29 ENCOUNTER — Ambulatory Visit: Payer: Self-pay

## 2024-05-29 LAB — SPECIMEN STATUS REPORT

## 2024-05-29 LAB — URINE CULTURE

## 2024-05-29 NOTE — Telephone Encounter (Signed)
 Called and LVM/ asked patient to call the office back to go over results and update medication.

## 2024-05-30 ENCOUNTER — Other Ambulatory Visit

## 2024-05-30 DIAGNOSIS — Z130 Encounter for screening for diseases of the blood and blood-forming organs and certain disorders involving the immune mechanism: Secondary | ICD-10-CM

## 2024-05-30 DIAGNOSIS — R300 Dysuria: Secondary | ICD-10-CM

## 2024-05-30 DIAGNOSIS — E669 Obesity, unspecified: Secondary | ICD-10-CM

## 2024-05-30 DIAGNOSIS — E559 Vitamin D deficiency, unspecified: Secondary | ICD-10-CM

## 2024-06-03 MED ORDER — METRONIDAZOLE 250 MG PO TABS: 250.0000 mg | ORAL_TABLET | Freq: Two times a day (BID) | ORAL | 0 refills | Status: AC

## 2024-06-03 NOTE — Telephone Encounter (Signed)
 Pt returned call and informed her of below and is good with you sending in the medication to [facility].    Ariel King [name] to [facility]     05/29/24  8:15 AM Result Note Please call the patient and inform her that her vaginal swab was positive for BV. This is not a sexually transmitted infection, but rather an overgrowth of bacteria. We treat this with Metronidazole  500 mg 2 x per day x 7 days. If the patient would like to move forward with treatment, please ask her what pharmacy she would like me to send the medication to. Also please advise her that while she takes this medication she should not drink ANY alcohol as the two together can cause severe reactions. Thank you!

## 2024-06-04 ENCOUNTER — Ambulatory Visit

## 2024-06-26 ENCOUNTER — Telehealth: Payer: Self-pay

## 2024-06-26 NOTE — Telephone Encounter (Signed)
 LVM for return call to reschedule labs and follow up appointment with Saddie

## 2024-06-30 ENCOUNTER — Telehealth: Payer: Self-pay

## 2024-06-30 NOTE — Telephone Encounter (Signed)
 Copied from CRM #[id number]. Topic: General - Other >> Jun 27, 2024  4:10 PM Marissa P wrote: Reason for CRM: Returning Landy Rosina BIRCH, CMA call, please call patient back as soon as possible

## 2024-07-01 ENCOUNTER — Telehealth: Payer: Self-pay | Admitting: *Deleted

## 2024-07-01 NOTE — Telephone Encounter (Signed)
 Contacted pt and she stated that she was just trying to get an appt and it is scheduled.

## 2024-07-01 NOTE — Telephone Encounter (Signed)
 Copied from CRM #[id number]. Topic: General - Other >> Jun 30, 2024  9:48 AM Rosina BIRCH wrote: Reason for CRM: patient is returning a call for labs CB [phone] >> Jun 30, 2024 10:28 AM Donna BRAVO wrote: Patient returning call regarding labs

## 2024-07-02 ENCOUNTER — Other Ambulatory Visit

## 2024-07-17 ENCOUNTER — Other Ambulatory Visit

## 2024-07-24 ENCOUNTER — Other Ambulatory Visit

## 2024-07-28 ENCOUNTER — Ambulatory Visit: Payer: Self-pay

## 2024-07-28 NOTE — Telephone Encounter (Signed)
 FYI Only or Action Required?: FYI only for provider.  Patient was last seen in primary care on 05/27/2024 by Gayle Saddie FALCON, PA-C.  Called Nurse Triage reporting Back Pain.  Symptoms began several days ago.  Interventions attempted: Rest, hydration, or home remedies.  Symptoms are: unchanged.  Triage Disposition: See HCP Within 4 Hours (Or PCP Triage)  Patient/caregiver understands and will follow disposition?: Yes, but will wait   Copied from CRM [id number]. Topic: Clinical - Red Word Triage >> Jul 28, 2024  1:05 PM Ariel King wrote: Reason for CRM: back pain and not going to the bathroom as much Reason for Disposition  [1] Pain or burning with passing urine (urination) AND [2] flank (e.g., in side of back, below ribs and above hip)  Answer Assessment - Initial Assessment Questions Additional info: Patient has labs scheduled on Friday, she is requesting to have them done during acute visit if possible. Spoke with CAL, labs are not drawn in afternoons, returned call to patient to let her know, she plans to keeps lab visit scheduled on Friday.     1. ONSET: When did the pain begin? (e.g., minutes, hours, days)     3-4 days ago  2. LOCATION: Where does it hurt? (upper, mid or lower back)     Mid back 3. SEVERITY: How bad is the pain?  (e.g., Scale 1-10; mild, moderate, or severe)     6/10 4. PATTERN: Is the pain constant? (e.g., yes, no; constant, intermittent)      intermittent 5. RADIATION: Does the pain shoot into your legs or somewhere else?     To legs  6. CAUSE:  What do you think is causing the back pain?      Muscle spasms 7. BACK OVERUSE:  Any recent lifting of heavy objects, strenuous work or exercise?     no 8. MEDICINES: What have you taken so far for the pain? (e.g., nothing, acetaminophen , NSAIDS)      9. NEUROLOGIC SYMPTOMS: Do you have any weakness, numbness, or problems with bowel/bladder control?     Tingling in foot from injury, already being  treated 10. OTHER SYMPTOMS: Do you have any other symptoms? (e.g., fever, abdomen pain, burning with urination, blood in urine)      Decreased urination-she is not drinking  much  Protocols used: Back Pain-A-AH

## 2024-07-29 ENCOUNTER — Encounter: Payer: Self-pay | Admitting: Family Medicine

## 2024-07-29 ENCOUNTER — Ambulatory Visit (INDEPENDENT_AMBULATORY_CARE_PROVIDER_SITE_OTHER): Admitting: Family Medicine

## 2024-07-29 VITALS — BP 122/74 | HR 88 | Ht 64.0 in | Wt 184.4 lb

## 2024-07-29 DIAGNOSIS — M5441 Lumbago with sciatica, right side: Secondary | ICD-10-CM

## 2024-07-29 DIAGNOSIS — R300 Dysuria: Secondary | ICD-10-CM

## 2024-07-29 DIAGNOSIS — E669 Obesity, unspecified: Secondary | ICD-10-CM

## 2024-07-29 DIAGNOSIS — J3089 Other allergic rhinitis: Secondary | ICD-10-CM | POA: Diagnosis not present

## 2024-07-29 DIAGNOSIS — J4520 Mild intermittent asthma, uncomplicated: Secondary | ICD-10-CM

## 2024-07-29 DIAGNOSIS — G8929 Other chronic pain: Secondary | ICD-10-CM

## 2024-07-29 LAB — POCT URINALYSIS DIP (CLINITEK)
Bilirubin, UA: NEGATIVE
Blood, UA: NEGATIVE
Glucose, UA: NEGATIVE mg/dL
Ketones, POC UA: NEGATIVE mg/dL
Leukocytes, UA: NEGATIVE
Nitrite, UA: NEGATIVE
POC PROTEIN,UA: NEGATIVE
Spec Grav, UA: 1.015 (ref 1.010–1.025)
Urobilinogen, UA: 0.2 U/dL
pH, UA: 7.5 (ref 5.0–8.0)

## 2024-07-29 MED ORDER — MONTELUKAST SODIUM 10 MG PO TABS: 10.0000 mg | ORAL_TABLET | Freq: Every day | ORAL | 1 refills | Status: AC

## 2024-07-29 MED ORDER — FLUTICASONE PROPIONATE 50 MCG/ACT NA SUSP: 2.0000 | Freq: Every day | NASAL | 1 refills | Status: AC

## 2024-07-29 MED ORDER — PHENAZOPYRIDINE HCL 95 MG PO TABS: 95.0000 mg | ORAL_TABLET | Freq: Three times a day (TID) | ORAL | 1 refills | Status: AC | PRN

## 2024-07-29 MED ORDER — LEVOCETIRIZINE DIHYDROCHLORIDE 5 MG PO TABS
5.0000 mg | ORAL_TABLET | Freq: Every evening | ORAL | 1 refills | Status: AC
Start: 2024-07-29 — End: ?
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
## 2024-07-29 NOTE — Patient Instructions (Signed)
 It was nice to see you today,  We addressed the following topics today: -[facility] [employer] [facility] [place], [facility] [place] and [facility] are places that may have low cost family counseling. - You did not have a UTI.  Your back pain is likely due to your chronic back pain. - I will refill your medications except for the vitamin D  which we will need to check your vitamin D  level first before refilling it. - I will send in an order for the injection but if it is not covered you can discuss using Topamax  with kara at your next visit.  Have a great day,  Rolan Slain, MD

## 2024-07-29 NOTE — Assessment & Plan Note (Addendum)
 Currently experiencing a flare of her chronic low back pain.  Takes several medications already for low back pain.  Advised to use heat/massage/topical medications.  Urinalysis was normal, not d/t UTI

## 2024-07-29 NOTE — Assessment & Plan Note (Addendum)
-   Expressed desire for weight management. Inquiring about restarting topiramate  and starting an injectable medication like Zepbound . Has Medicare and Medicaid. Advised that Medicare does not cover these injections for weight loss and Medicaid coverage is uncertain. Counseled that out-of-pocket cost is approximately $400-500 per month. - Will send a prescription for a weight loss injection to the pharmacy to check for insurance coverage. - If not covered, may discuss restarting topiramate  with Saddie at the next appointment in October.

## 2024-07-29 NOTE — Assessment & Plan Note (Signed)
 Refill flonase , xyzal  and singulair 

## 2024-07-31 ENCOUNTER — Other Ambulatory Visit: Payer: Self-pay | Admitting: Family Medicine

## 2024-07-31 MED ORDER — ZEPBOUND 2.5 MG/0.5ML ~~LOC~~ SOAJ: 2.5000 mg | SUBCUTANEOUS | 0 refills | Status: AC

## 2024-08-01 ENCOUNTER — Other Ambulatory Visit

## 2024-08-01 ENCOUNTER — Other Ambulatory Visit: Payer: Self-pay | Admitting: Family Medicine

## 2024-08-02 LAB — COMPREHENSIVE METABOLIC PANEL WITH GFR
ALT: 13 IU/L (ref 0–32)
AST: 14 IU/L (ref 0–40)
Albumin: 4.2 g/dL (ref 3.8–4.9)
Alkaline Phosphatase: 92 IU/L (ref 49–135)
BUN/Creatinine Ratio: 8 — ABNORMAL LOW (ref 9–23)
BUN: 7 mg/dL (ref 6–24)
Bilirubin Total: 0.3 mg/dL (ref 0.0–1.2)
CO2: 22 mmol/L (ref 20–29)
Calcium: 9.7 mg/dL (ref 8.7–10.2)
Chloride: 103 mmol/L (ref 96–106)
Creatinine, Ser: 0.93 mg/dL (ref 0.57–1.00)
Globulin, Total: 2.4 g/dL (ref 1.5–4.5)
Glucose: 163 mg/dL — ABNORMAL HIGH (ref 70–99)
Potassium: 4.6 mmol/L (ref 3.5–5.2)
Sodium: 142 mmol/L (ref 134–144)
Total Protein: 6.6 g/dL (ref 6.0–8.5)
eGFR: 73 mL/min/1.73 (ref >59)

## 2024-08-02 LAB — CBC WITH DIFFERENTIAL/PLATELET
Basophils Absolute: 0 x10E3/uL (ref 0.0–0.2)
Basos: 0 %
EOS (ABSOLUTE): 0.6 x10E3/uL — ABNORMAL HIGH (ref 0.0–0.4)
Eos: 8 %
Hematocrit: 47.3 % — ABNORMAL HIGH (ref 34.0–46.6)
Hemoglobin: 15.7 g/dL (ref 11.1–15.9)
Immature Grans (Abs): 0 x10E3/uL (ref 0.0–0.1)
Immature Granulocytes: 0 %
Lymphocytes Absolute: 2.6 x10E3/uL (ref 0.7–3.1)
Lymphs: 37 %
MCH: 31.2 pg (ref 26.6–33.0)
MCHC: 33.2 g/dL (ref 31.5–35.7)
MCV: 94 fL (ref 79–97)
Monocytes Absolute: 0.5 x10E3/uL (ref 0.1–0.9)
Monocytes: 6 %
Neutrophils Absolute: 3.4 x10E3/uL (ref 1.4–7.0)
Neutrophils: 49 %
Platelets: 241 x10E3/uL (ref 150–450)
RBC: 5.04 x10E6/uL (ref 3.77–5.28)
RDW: 13.9 % (ref 11.7–15.4)
WBC: 7 x10E3/uL (ref 3.4–10.8)

## 2024-08-02 LAB — LIPID PANEL
Chol/HDL Ratio: 3.6 ratio (ref 0.0–4.4)
Cholesterol, Total: 185 mg/dL (ref 100–199)
HDL: 52 mg/dL (ref >39)
LDL Chol Calc (NIH): 96 mg/dL (ref 0–99)
Triglycerides: 221 mg/dL — ABNORMAL HIGH (ref 0–149)
VLDL Cholesterol Cal: 37 mg/dL (ref 5–40)

## 2024-08-02 LAB — HEMOGLOBIN A1C
Est. average glucose Bld gHb Est-mCnc: 105 mg/dL
Hgb A1c MFr Bld: 5.3 % (ref 4.8–5.6)

## 2024-08-02 LAB — VITAMIN D 25 HYDROXY (VIT D DEFICIENCY, FRACTURES): Vit D, 25-Hydroxy: 34.1 ng/mL (ref 30.0–100.0)

## 2024-08-02 LAB — TSH: TSH: 0.226 u[IU]/mL — ABNORMAL LOW (ref 0.450–4.500)

## 2024-08-03 LAB — URINE CULTURE

## 2024-08-20 ENCOUNTER — Ambulatory Visit

## 2024-09-10 ENCOUNTER — Telehealth: Payer: Self-pay

## 2024-09-10 NOTE — Telephone Encounter (Signed)
Message forward to provider.

## 2024-09-11 NOTE — Telephone Encounter (Signed)
Sent patient a My-Chart message and LVM

## 2024-09-12 ENCOUNTER — Other Ambulatory Visit: Payer: Self-pay

## 2024-09-12 ENCOUNTER — Telehealth: Payer: Self-pay

## 2024-09-12 DIAGNOSIS — H04123 Dry eye syndrome of bilateral lacrimal glands: Secondary | ICD-10-CM

## 2024-09-12 MED ORDER — MIEBO 1.338 GM/ML OP SOLN
1.0000 [drp] | Freq: Four times a day (QID) | OPHTHALMIC | 1 refills | Status: AC | PRN
Start: 2024-09-12 — End: ?

## 2024-09-12 NOTE — Telephone Encounter (Signed)
 Copied from CRM #[id number]. Topic: General - Call Back - No Documentation >> Sep 11, 2024  5:23 PM Kevelyn M wrote: Reason for CRM: Patient calling back about a prescription for dry eyes.  Call back # [phone]

## 2024-09-12 NOTE — Telephone Encounter (Signed)
 Rx sent for perfluorohexyloctane eye drops to [facility]

## 2024-10-20 ENCOUNTER — Other Ambulatory Visit: Payer: Self-pay

## 2024-10-20 DIAGNOSIS — E038 Other specified hypothyroidism: Secondary | ICD-10-CM

## 2024-11-26 NOTE — Telephone Encounter (Signed)
 Good Afternoon Dayan. We received your message requesting handicap sticker. We do not fill out forms for this. You would need this completed by your PCP.  Best, Triage Pain Services

## 2024-11-26 NOTE — Telephone Encounter (Signed)
 Patient is requesting to have a handicap sticker form filled out she would like to know how that process goes Please advise

## 2024-12-01 ENCOUNTER — Telehealth: Payer: Self-pay

## 2024-12-01 NOTE — Telephone Encounter (Signed)
 Copied from CRM [id number]. Topic: General - Call Back - No Documentation >> Dec 01, 2024 12:46 PM Berwyn MATSU wrote: Reason for CRM: Patient called in stating that she is returning hazels call. I see no notes or documentation regarding a call.  May you please assist.  I(Aza Dantes) returned the phone call and NO answer. I have no idea what her need maybe with me. I'm here to answer any concerns/questions.
